# Patient Record
Sex: Female | Born: 1955
Health system: Southern US, Community
[De-identification: ages and names within clinical notes are randomized; demographics above are authoritative.]

## PROBLEM LIST (undated history)

## (undated) DIAGNOSIS — K430 Incisional hernia with obstruction, without gangrene: Secondary | ICD-10-CM

## (undated) DIAGNOSIS — K219 Gastro-esophageal reflux disease without esophagitis: Secondary | ICD-10-CM

## (undated) DIAGNOSIS — N179 Acute kidney failure, unspecified: Secondary | ICD-10-CM

## (undated) DIAGNOSIS — I10 Essential (primary) hypertension: Secondary | ICD-10-CM

## (undated) DIAGNOSIS — Z889 Allergy status to unspecified drugs, medicaments and biological substances status: Secondary | ICD-10-CM

## (undated) HISTORY — PX: TOTAL HIP ARTHROPLASTY: SHX124

## (undated) HISTORY — PX: ABDOMINAL HYSTERECTOMY: SHX81

## (undated) HISTORY — PX: WISDOM TOOTH EXTRACTION: SHX21

## (undated) HISTORY — PX: APPENDECTOMY: SHX54

## (undated) HISTORY — PX: TOE SURGERY: SHX1073

---

## 2005-08-23 ENCOUNTER — Ambulatory Visit (HOSPITAL_BASED_OUTPATIENT_CLINIC_OR_DEPARTMENT_OTHER): Admission: RE | Admit: 2005-08-23 | Discharge: 2005-08-23 | Payer: Self-pay | Admitting: Orthopedic Surgery

## 2005-08-23 ENCOUNTER — Ambulatory Visit (HOSPITAL_COMMUNITY): Admission: RE | Admit: 2005-08-23 | Discharge: 2005-08-23 | Payer: Self-pay | Admitting: Orthopedic Surgery

## 2008-02-29 ENCOUNTER — Other Ambulatory Visit: Admission: RE | Admit: 2008-02-29 | Discharge: 2008-02-29 | Payer: Self-pay | Admitting: Obstetrics & Gynecology

## 2008-03-10 DIAGNOSIS — K2100 Gastro-esophageal reflux disease with esophagitis, without bleeding: Secondary | ICD-10-CM | POA: Insufficient documentation

## 2008-03-10 DIAGNOSIS — E7800 Pure hypercholesterolemia, unspecified: Secondary | ICD-10-CM | POA: Insufficient documentation

## 2008-03-13 ENCOUNTER — Encounter: Admission: RE | Admit: 2008-03-13 | Discharge: 2008-03-13 | Payer: Self-pay | Admitting: Obstetrics & Gynecology

## 2008-03-25 ENCOUNTER — Encounter: Admission: RE | Admit: 2008-03-25 | Discharge: 2008-03-25 | Payer: Self-pay | Admitting: Obstetrics & Gynecology

## 2009-03-04 ENCOUNTER — Other Ambulatory Visit: Admission: RE | Admit: 2009-03-04 | Discharge: 2009-03-04 | Payer: Self-pay | Admitting: Obstetrics & Gynecology

## 2009-09-29 ENCOUNTER — Ambulatory Visit (HOSPITAL_COMMUNITY): Admission: RE | Admit: 2009-09-29 | Discharge: 2009-09-30 | Payer: Self-pay | Admitting: Obstetrics & Gynecology

## 2009-09-29 ENCOUNTER — Encounter: Payer: Self-pay | Admitting: Obstetrics & Gynecology

## 2010-02-09 DIAGNOSIS — F419 Anxiety disorder, unspecified: Secondary | ICD-10-CM | POA: Insufficient documentation

## 2010-06-04 ENCOUNTER — Encounter (INDEPENDENT_AMBULATORY_CARE_PROVIDER_SITE_OTHER): Payer: Self-pay | Admitting: General Surgery

## 2010-06-04 ENCOUNTER — Inpatient Hospital Stay (HOSPITAL_COMMUNITY): Admission: EM | Admit: 2010-06-04 | Discharge: 2010-06-08 | Payer: Self-pay | Admitting: Emergency Medicine

## 2010-06-30 DIAGNOSIS — G4700 Insomnia, unspecified: Secondary | ICD-10-CM | POA: Insufficient documentation

## 2010-09-16 ENCOUNTER — Inpatient Hospital Stay (HOSPITAL_COMMUNITY): Admission: RE | Admit: 2010-09-16 | Discharge: 2010-09-19 | Payer: Self-pay | Admitting: Orthopedic Surgery

## 2011-01-09 ENCOUNTER — Encounter: Payer: Self-pay | Admitting: Obstetrics & Gynecology

## 2011-03-03 LAB — COMPREHENSIVE METABOLIC PANEL
ALT: 19 U/L (ref 0–35)
AST: 23 U/L (ref 0–37)
BUN: 12 mg/dL (ref 6–23)
Creatinine, Ser: 0.56 mg/dL (ref 0.4–1.2)
Glucose, Bld: 97 mg/dL (ref 70–99)
Potassium: 4.4 mEq/L (ref 3.5–5.1)
Sodium: 137 mEq/L (ref 135–145)
Total Bilirubin: 0.5 mg/dL (ref 0.3–1.2)
Total Protein: 7.1 g/dL (ref 6.0–8.3)

## 2011-03-03 LAB — CBC
MCH: 33.5 pg (ref 26.0–34.0)
MCHC: 34.9 g/dL (ref 30.0–36.0)
MCV: 95.8 fL (ref 78.0–100.0)
RBC: 4.12 MIL/uL (ref 3.87–5.11)

## 2011-03-03 LAB — TYPE AND SCREEN
ABO/RH(D): A NEG
Antibody Screen: NEGATIVE

## 2011-03-03 LAB — DIFFERENTIAL
Basophils Absolute: 0 10*3/uL (ref 0.0–0.1)
Basophils Relative: 1 % (ref 0–1)
Eosinophils Absolute: 0.1 10*3/uL (ref 0.0–0.7)
Neutro Abs: 1.5 10*3/uL — ABNORMAL LOW (ref 1.7–7.7)

## 2011-03-03 LAB — URINALYSIS, ROUTINE W REFLEX MICROSCOPIC
Hgb urine dipstick: NEGATIVE
Nitrite: NEGATIVE
Specific Gravity, Urine: 1.009 (ref 1.005–1.030)
Urobilinogen, UA: 0.2 mg/dL (ref 0.0–1.0)

## 2011-03-03 LAB — HEMOGLOBIN AND HEMATOCRIT, BLOOD
HCT: 30.5 % — ABNORMAL LOW (ref 36.0–46.0)
HCT: 31.1 % — ABNORMAL LOW (ref 36.0–46.0)
HCT: 31.3 % — ABNORMAL LOW (ref 36.0–46.0)
Hemoglobin: 10.8 g/dL — ABNORMAL LOW (ref 12.0–15.0)
Hemoglobin: 12.5 g/dL (ref 12.0–15.0)

## 2011-03-03 LAB — URINE CULTURE: Colony Count: 25000

## 2011-03-03 LAB — APTT: aPTT: 26 seconds (ref 24–37)

## 2011-03-03 LAB — SURGICAL PCR SCREEN
MRSA, PCR: NEGATIVE
Staphylococcus aureus: NEGATIVE

## 2011-03-03 LAB — PROTIME-INR
Prothrombin Time: 13 seconds (ref 11.6–15.2)
Prothrombin Time: 13.6 seconds (ref 11.6–15.2)

## 2011-03-06 LAB — COMPREHENSIVE METABOLIC PANEL
AST: 29 U/L (ref 0–37)
Albumin: 3.5 g/dL (ref 3.5–5.2)
Alkaline Phosphatase: 72 U/L (ref 39–117)
Calcium: 8.6 mg/dL (ref 8.4–10.5)
Chloride: 99 mEq/L (ref 96–112)
GFR calc Af Amer: >60 mL/min (ref >60)
GFR calc non Af Amer: >60 mL/min (ref >60)
Potassium: 3.7 mEq/L (ref 3.5–5.1)
Total Protein: 7.3 g/dL (ref 6.0–8.3)

## 2011-03-06 LAB — URINALYSIS, ROUTINE W REFLEX MICROSCOPIC
Glucose, UA: NEGATIVE mg/dL
Specific Gravity, Urine: 1.014 (ref 1.005–1.030)
Urobilinogen, UA: 1 mg/dL (ref 0.0–1.0)

## 2011-03-06 LAB — BASIC METABOLIC PANEL
BUN: 3 mg/dL — ABNORMAL LOW (ref 6–23)
BUN: 3 mg/dL — ABNORMAL LOW (ref 6–23)
BUN: 3 mg/dL — ABNORMAL LOW (ref 6–23)
CO2: 26 mEq/L (ref 19–32)
Calcium: 8.5 mg/dL (ref 8.4–10.5)
Chloride: 104 mEq/L (ref 96–112)
Chloride: 105 mEq/L (ref 96–112)
Creatinine, Ser: 0.49 mg/dL (ref 0.4–1.2)
Creatinine, Ser: 0.56 mg/dL (ref 0.4–1.2)
GFR calc Af Amer: >60 mL/min (ref >60)
GFR calc non Af Amer: >60 mL/min (ref >60)
Glucose, Bld: 111 mg/dL — ABNORMAL HIGH (ref 70–99)
Glucose, Bld: 132 mg/dL — ABNORMAL HIGH (ref 70–99)
Potassium: 3.2 mEq/L — ABNORMAL LOW (ref 3.5–5.1)
Potassium: 3.3 mEq/L — ABNORMAL LOW (ref 3.5–5.1)
Sodium: 138 mEq/L (ref 135–145)

## 2011-03-06 LAB — DIFFERENTIAL
Eosinophils Absolute: 0 10*3/uL (ref 0.0–0.7)
Lymphocytes Relative: 8 % — ABNORMAL LOW (ref 12–46)
Lymphs Abs: 0.6 10*3/uL — ABNORMAL LOW (ref 0.7–4.0)
Monocytes Absolute: 0.5 10*3/uL (ref 0.1–1.0)
Neutro Abs: 6.2 10*3/uL (ref 1.7–7.7)
Neutrophils Relative %: 84 % — ABNORMAL HIGH (ref 43–77)

## 2011-03-06 LAB — CBC
HCT: 32.3 % — ABNORMAL LOW (ref 36.0–46.0)
HCT: 32.6 % — ABNORMAL LOW (ref 36.0–46.0)
Hemoglobin: 10.9 g/dL — ABNORMAL LOW (ref 12.0–15.0)
Hemoglobin: 14.2 g/dL (ref 12.0–15.0)
MCHC: 33.7 g/dL (ref 30.0–36.0)
MCV: 96.1 fL (ref 78.0–100.0)
MCV: 96.8 fL (ref 78.0–100.0)
Platelets: 281 10*3/uL (ref 150–400)
RBC: 3.35 MIL/uL — ABNORMAL LOW (ref 3.87–5.11)
RBC: 4.4 MIL/uL (ref 3.87–5.11)
RDW: 14.1 % (ref 11.5–15.5)
WBC: 8.3 10*3/uL (ref 4.0–10.5)
WBC: 8.8 10*3/uL (ref 4.0–10.5)

## 2011-03-06 LAB — LIPASE, BLOOD: Lipase: 22 U/L (ref 11–59)

## 2011-03-06 LAB — LACTIC ACID, PLASMA: Lactic Acid, Venous: 1.7 mmol/L (ref 0.5–2.2)

## 2011-03-24 LAB — BASIC METABOLIC PANEL
BUN: 7 mg/dL (ref 6–23)
CO2: 27 mEq/L (ref 19–32)
Chloride: 106 mEq/L (ref 96–112)
Chloride: 99 mEq/L (ref 96–112)
Creatinine, Ser: 0.61 mg/dL (ref 0.4–1.2)
GFR calc Af Amer: >60 mL/min (ref >60)
GFR calc non Af Amer: >60 mL/min (ref >60)
Glucose, Bld: 96 mg/dL (ref 70–99)
Potassium: 4.2 mEq/L (ref 3.5–5.1)
Potassium: 4.3 mEq/L (ref 3.5–5.1)

## 2011-03-24 LAB — CBC
HCT: 31.8 % — ABNORMAL LOW (ref 36.0–46.0)
HCT: 38.6 % (ref 36.0–46.0)
Hemoglobin: 13.4 g/dL (ref 12.0–15.0)
MCV: 98 fL (ref 78.0–100.0)
MCV: 98.2 fL (ref 78.0–100.0)
Platelets: 296 10*3/uL (ref 150–400)
Platelets: 322 10*3/uL (ref 150–400)
RBC: 3.24 MIL/uL — ABNORMAL LOW (ref 3.87–5.11)
RDW: 13.3 % (ref 11.5–15.5)
WBC: 6.9 10*3/uL (ref 4.0–10.5)

## 2011-03-24 LAB — URINALYSIS, ROUTINE W REFLEX MICROSCOPIC
Bilirubin Urine: NEGATIVE
Glucose, UA: NEGATIVE mg/dL
Ketones, ur: NEGATIVE mg/dL
Protein, ur: NEGATIVE mg/dL

## 2011-03-24 LAB — TYPE AND SCREEN

## 2011-03-24 LAB — ABO/RH: ABO/RH(D): A NEG

## 2011-05-06 NOTE — Op Note (Signed)
NAMECHERISH, RUNDE                ACCOUNT NO.:  [id number]   MEDICAL RECORD NO.:  [id number]         PATIENT TYPE:  AMB   LOCATION:  DSC                          FACILITY:  [facility]   PHYSICIAN:  Leonides Grills, M.D.     DATE OF BIRTH:  03/27/1956   DATE OF PROCEDURE:  08/23/2005  DATE OF DISCHARGE:                                 OPERATIVE REPORT   PREOPERATIVE DIAGNOSIS:  Right hallux rigidus.   POSTOPERATIVE DIAGNOSIS:  Right hallux rigidus.   OPERATION:  Right great toe cheilectomy.   ANESTHESIA:  General.   SURGEON:  Leonides Grills, M.D.   ASSISTANT:  Lianne Cure, P.A.   ESTIMATED BLOOD LOSS:  Minimal.   TOURNIQUET TIME:  Approximately 1/2 hour.   COMPLICATIONS:  None.   DISPOSITION:  Stable.   INDICATIONS:  This is a 55 year old pleasant female who has long-standing  dorsal right great toe pain ever since __________.  She was consented for  the above procedure.  All risks, which included infection, nerve/vessel  injury, stiffness, arthritis, possible fusion, prolonged recovery, same  pain, and worsening pain were all explained.  Questions were answered.   DESCRIPTION OF PROCEDURE:  The patient was brought to the operating room,  placed in supine position.  After adequate general endotracheal anesthesia  was administered, as well as Ancef 1 g IV piggyback, the right lower  extremity was then prepped and draped in sterile manner.  Proximal-placed  thigh tourniquet.  The limb was exsanguinated.  Tourniquet was elevated to  209 mmHg.  Longitudinal incision over the dorsal aspect of the great toe,  MCP joint, was the made.  Dissection was carried down through skin.  Hemostasis was obtained.  Extensor hallucis longus was then identified and  protected throughout the case within its sheath.  Longitudinal capsulotomy  was then made.  A cheilectomy was then performed with a sagittal saw and a  rongeur.  There was extra loose body here as well, and this was removed.  Once  this was adequately decompressed, both in the medial and lateral  gutters, as well as the base of the proximal phalanx, and approximately the  upper third of the joint was removed.  The area was copiously irrigated with  normal saline.  The toe was ranged at about 70-80 degrees of range of  motion, dorsiflexion.  The area was again copiously irrigated with normal  saline.  Bone wax was applied to open bone  surfaces.  Capsule was closed with 3-0 Vicryl suture.  Tourniquet was  deflated.  Hemostasis was obtained.  Skin was closed with 4-0 nylon suture.  Sterile dressing was applied.  Hard-soled shoe was applied.  The patient was  stable __________.      Leonides Grills, M.D.  Electronically Signed     PB/MEDQ  D:  08/23/2005  T:  08/23/2005  Job:  [id number]

## 2012-03-05 ENCOUNTER — Other Ambulatory Visit: Payer: Self-pay | Admitting: Obstetrics & Gynecology

## 2012-03-05 DIAGNOSIS — Z1231 Encounter for screening mammogram for malignant neoplasm of breast: Secondary | ICD-10-CM

## 2012-03-15 ENCOUNTER — Ambulatory Visit
Admission: RE | Admit: 2012-03-15 | Discharge: 2012-03-15 | Disposition: A | Discharge status: Home / Self Care | Payer: BC Managed Care – PPO | Source: Ambulatory Visit | Attending: Obstetrics & Gynecology | Admitting: Obstetrics & Gynecology

## 2012-03-15 DIAGNOSIS — Z1231 Encounter for screening mammogram for malignant neoplasm of breast: Secondary | ICD-10-CM

## 2012-05-31 ENCOUNTER — Other Ambulatory Visit: Payer: Self-pay | Admitting: Family Medicine

## 2012-05-31 DIAGNOSIS — Z780 Asymptomatic menopausal state: Secondary | ICD-10-CM

## 2012-06-14 ENCOUNTER — Ambulatory Visit
Admission: RE | Admit: 2012-06-14 | Discharge: 2012-06-14 | Disposition: A | Discharge status: Home / Self Care | Payer: BC Managed Care – PPO | Source: Ambulatory Visit | Attending: Family Medicine | Admitting: Family Medicine

## 2012-06-14 DIAGNOSIS — Z780 Asymptomatic menopausal state: Secondary | ICD-10-CM

## 2013-08-20 LAB — CBC AND DIFFERENTIAL
HCT: 41 % (ref 36–46)
Hemoglobin: 14.2 g/dL (ref 12.0–16.0)
Platelets: 217 10*3/uL (ref 150–399)
WBC: 4.6 10*3/mL

## 2013-08-20 LAB — LIPID PANEL
Cholesterol: 205 mg/dL — AB (ref 0–200)
HDL: 67 mg/dL (ref 35–70)
LDL Cholesterol: 112 mg/dL
Triglycerides: 129 mg/dL (ref 40–160)

## 2013-08-20 LAB — BASIC METABOLIC PANEL
BUN: 5 mg/dL (ref 4–21)
CREATININE: 0.6 mg/dL (ref 0.5–1.1)
Glucose: 105 mg/dL
POTASSIUM: 3.7 mmol/L (ref 3.4–5.3)
SODIUM: 140 mmol/L (ref 137–147)

## 2013-08-20 LAB — TSH: TSH: 4.22 u[IU]/mL (ref 0.41–5.90)

## 2015-07-06 DIAGNOSIS — E663 Overweight: Secondary | ICD-10-CM | POA: Insufficient documentation

## 2015-07-06 DIAGNOSIS — K589 Irritable bowel syndrome without diarrhea: Secondary | ICD-10-CM | POA: Insufficient documentation

## 2015-07-06 DIAGNOSIS — I10 Essential (primary) hypertension: Secondary | ICD-10-CM | POA: Insufficient documentation

## 2015-07-06 DIAGNOSIS — F320 Major depressive disorder, single episode, mild: Secondary | ICD-10-CM | POA: Insufficient documentation

## 2015-07-06 DIAGNOSIS — Z780 Asymptomatic menopausal state: Secondary | ICD-10-CM | POA: Insufficient documentation

## 2015-07-06 DIAGNOSIS — M9979 Connective tissue and disc stenosis of intervertebral foramina of abdomen and other regions: Secondary | ICD-10-CM | POA: Insufficient documentation

## 2015-07-06 DIAGNOSIS — Z87891 Personal history of nicotine dependence: Secondary | ICD-10-CM | POA: Insufficient documentation

## 2015-07-06 DIAGNOSIS — M479 Spondylosis, unspecified: Secondary | ICD-10-CM | POA: Insufficient documentation

## 2015-07-07 ENCOUNTER — Ambulatory Visit (INDEPENDENT_AMBULATORY_CARE_PROVIDER_SITE_OTHER): Payer: BLUE CROSS/BLUE SHIELD | Admitting: Family Medicine

## 2015-07-07 ENCOUNTER — Encounter: Payer: Self-pay | Admitting: Family Medicine

## 2015-07-07 VITALS — BP 124/80 | HR 72 | Temp 97.4°F | Resp 16 | Wt 158.0 lb

## 2015-07-07 DIAGNOSIS — R05 Cough: Secondary | ICD-10-CM

## 2015-07-07 DIAGNOSIS — I10 Essential (primary) hypertension: Secondary | ICD-10-CM

## 2015-07-07 DIAGNOSIS — J40 Bronchitis, not specified as acute or chronic: Secondary | ICD-10-CM | POA: Diagnosis not present

## 2015-07-07 DIAGNOSIS — R059 Cough, unspecified: Secondary | ICD-10-CM

## 2015-07-07 MED ORDER — BENZONATATE 200 MG PO CAPS: 200.0000 mg | ORAL_CAPSULE | Freq: Three times a day (TID) | ORAL | Status: DC | PRN

## 2015-07-07 MED ORDER — METOPROLOL SUCCINATE ER 50 MG PO TB24: 50.0000 mg | ORAL_TABLET | Freq: Every day | ORAL | Status: DC

## 2015-07-07 MED ORDER — AZITHROMYCIN 250 MG PO TABS: ORAL_TABLET | ORAL | Status: DC

## 2015-07-07 NOTE — Progress Notes (Signed)
Patient ID: Ana Ryan, female   DOB: [birth year], 59 y.o.   MRN: [id number]    Subjective:  URI  This is a new problem. Episode onset: 2 weeks ago. The problem has been unchanged. There has been no fever. Associated symptoms include congestion, coughing (dry), ear pain (right ear fullness), a plugged ear sensation, rhinorrhea and a sore throat (and hoarseness in the begining). Pertinent negatives include no abdominal pain, chest pain, diarrhea, dysuria, headaches, joint pain, joint swelling, nausea, neck pain, rash, sinus pain, sneezing, swollen glands, vomiting or wheezing. She has tried decongestant for the symptoms. The treatment provided mild relief.  Hypertension Associated symptoms include malaise/fatigue (does not feel 100% but does not feel terrible.). Pertinent negatives include no chest pain, headaches or neck pain.  Pt reports that she needs a refill on her Metoprolol today. She is not checking her BP at home.    Prior to Admission medications   Medication Sig Start Date End Date Taking? Authorizing Provider  calcium-vitamin D (OSCAL 500/200 D-3) 500-200 MG-UNIT per tablet Take by mouth.   Yes Historical Provider, MD  esomeprazole (NEXIUM) 40 MG packet NEXIUM, 40MG (Oral Packet)  1 Every Day for 0 days  Quantity: 0.00;  Refills: 0   Ordered :25-Nov-2010  Althea Charon ;  Started 09-Feb-2010 Active 02/09/10  Yes Historical Provider, MD  metoprolol succinate (TOPROL-XL) 50 MG 24 hr tablet Take by mouth. 09/15/14  Yes Historical Provider, MD  Melatonin 1 MG CAPS Take by mouth.    Historical Provider, MD    Patient Active Problem List   Diagnosis Date Noted  . Narrowing of intervertebral disc space 07/06/2015  . Spondylosis 07/06/2015  . Ex-cigarette smoker 07/06/2015  . BP (high blood pressure) 07/06/2015  . Adaptive colitis 07/06/2015  . Mild major depression 07/06/2015  . Asymptomatic postmenopausal status 07/06/2015  . Overweight 07/06/2015  . Cannot sleep 06/30/2010    . Anxiety 02/09/2010  . Esophagitis, reflux 03/10/2008  . Hypercholesteremia 03/10/2008    History reviewed. No pertinent past medical history.  History   Social History  . Marital Status: Married    Spouse Name: N/A  . Number of Children: N/A  . Years of Education: N/A   Occupational History  . Not on file.   Social History Main Topics  . Smoking status: Former Research scientist (life sciences)  . Smokeless tobacco: Not on file     Comment: did not smoke regularly  . Alcohol Use: No  . Drug Use: No  . Sexual Activity: Not on file   Other Topics Concern  . Not on file   Social History Narrative    No Known Allergies  Review of Systems  Constitutional: Positive for malaise/fatigue (does not feel 100% but does not feel terrible.).  HENT: Positive for congestion, ear pain (right ear fullness), rhinorrhea and sore throat (and hoarseness in the begining). Negative for sneezing.   Eyes: Negative.   Respiratory: Positive for cough (dry). Negative for wheezing.   Cardiovascular: Negative.  Negative for chest pain.  Gastrointestinal: Negative.  Negative for nausea, vomiting, abdominal pain and diarrhea.  Genitourinary: Negative.  Negative for dysuria.  Musculoskeletal: Negative.  Negative for joint pain and neck pain.  Skin: Negative.  Negative for rash.  Neurological: Negative.  Negative for headaches.  Endo/Heme/Allergies: Negative.   Psychiatric/Behavioral: Negative.     Immunization History  Administered Date(s) Administered  . Tdap 06/30/2010   Objective:  BP 124/80 mmHg  Pulse 72  Temp(Src) 97.4 F (36.3 C) (Oral)  Resp  16  Wt 158 lb (71.668 kg)  SpO2 98%  Physical Exam  Constitutional: She is oriented to person, place, and time and well-developed, well-nourished, and in no distress.  HENT:  Head: Normocephalic and atraumatic.  Right Ear: External ear normal.  Left Ear: External ear normal.  Nose: Nose normal.  Mouth/Throat: Oropharynx is clear and moist.  Eyes: Conjunctivae  and EOM are normal. Pupils are equal, round, and reactive to light.  Neck: Normal range of motion. Neck supple.  Cardiovascular: Normal rate, regular rhythm, normal heart sounds and intact distal pulses.   Pulmonary/Chest: Effort normal and breath sounds normal.  Abdominal: Soft. Bowel sounds are normal.  Neurological: She is alert and oriented to person, place, and time. She has normal reflexes. Gait normal. GCS score is 15.  Skin: Skin is warm and dry.  Psychiatric: Mood, memory, affect and judgment normal.    Lab Results  Component Value Date   WBC 4.6 08/20/2013   HGB 14.2 08/20/2013   HCT 41 08/20/2013   PLT 217 08/20/2013   GLUCOSE 97 09/08/2010   CHOL 205* 08/20/2013   TRIG 129 08/20/2013   HDL 67 08/20/2013   LDLCALC 112 08/20/2013   TSH 4.22 08/20/2013   INR 1.66* 09/19/2010    CMP     Component Value Date/Time   NA 140 08/20/2013   NA 137 09/08/2010 1404   K 3.7 08/20/2013   CL 102 09/08/2010 1404   CO2 26 09/08/2010 1404   GLUCOSE 97 09/08/2010 1404   BUN 5 08/20/2013   BUN 12 09/08/2010 1404   CREATININE 0.6 08/20/2013   CREATININE 0.56 09/08/2010 1404   CALCIUM 9.0 09/08/2010 1404   PROT 7.1 09/08/2010 1404   ALBUMIN 4.3 09/08/2010 1404   AST 23 09/08/2010 1404   ALT 19 09/08/2010 1404   ALKPHOS 56 09/08/2010 1404   BILITOT 0.5 09/08/2010 1404   GFRNONAA >60 09/08/2010 1404   GFRAA  09/08/2010 1404    >60        The eGFR has been calculated using the MDRD equation. This calculation has not been validated in all clinical situations. eGFR's persistently <60 mL/min signify possible Chronic Kidney Disease.    Assessment and Plan :  1. Essential hypertension  - metoprolol succinate (TOPROL-XL) 50 MG 24 hr tablet; Take 1 tablet (50 mg total) by mouth daily.  Dispense: 30 tablet; Refill: 12  2. Cough  - benzonatate (TESSALON) 200 MG capsule; Take 1 capsule (200 mg total) by mouth 3 (three) times daily as needed for cough.  Dispense: 45 capsule;  Refill: 2  3. Bronchitis  - azithromycin (ZITHROMAX) 250 MG tablet; Take 2 pills the first day and then one daily until finished.  Dispense: 6 each; Refill: 0 Chest x-ray not indicated  4. Depression/anxiety/substance use Patient has been doing very well in 2016. Much better Patient was seen and examined by Dr. Miguel Aschoff, and noted scribed by Webb Laws, [place] MD [facility] 07/07/2015 11:39 AM

## 2015-08-19 ENCOUNTER — Encounter: Payer: Self-pay | Admitting: Family Medicine

## 2016-02-16 ENCOUNTER — Ambulatory Visit: Payer: BLUE CROSS/BLUE SHIELD | Admitting: Family Medicine

## 2016-02-18 ENCOUNTER — Encounter: Payer: Self-pay | Admitting: *Deleted

## 2016-02-18 ENCOUNTER — Encounter: Payer: Self-pay | Admitting: Family Medicine

## 2016-02-18 ENCOUNTER — Ambulatory Visit (INDEPENDENT_AMBULATORY_CARE_PROVIDER_SITE_OTHER): Payer: BLUE CROSS/BLUE SHIELD | Admitting: Family Medicine

## 2016-02-18 VITALS — BP 108/64 | HR 68 | Temp 97.6°F | Resp 16 | Wt 171.0 lb

## 2016-02-18 DIAGNOSIS — K4090 Unilateral inguinal hernia, without obstruction or gangrene, not specified as recurrent: Secondary | ICD-10-CM

## 2016-02-18 NOTE — Progress Notes (Signed)
Patient ID: Ana Ryan, female   DOB: 30-Nov-1956, 60 y.o.   MRN: [id number]    Subjective:  HPI Pt is here today because about 3-4 years ago she noticed that she has "lumps" on her abdomen. She reports that they are near her surgical scars from prior surgeries. They do not hurt and she is not having any other symptoms. She has noticed that they have gotten " bigger" or she is noticing them more. She reports that this may be because she has gained weight. She wants to make sure these are nothing worrisome and that they are just scar tissue. Overall patient is doing well. Her middle son got married and ended 2016. Her oldest son is married. Daughter has a 58-year-old daughter who is the patient's only grandchild. Prior to Admission medications   Medication Sig Start Date End Date Taking? Authorizing Provider  calcium-vitamin D (OSCAL 500/200 D-3) 500-200 MG-UNIT per tablet Take by mouth.   Yes Historical Provider, MD  esomeprazole (NEXIUM) 40 MG packet NEXIUM, 40MG (Oral Packet)  1 Every Day for 0 days  Quantity: 0.00;  Refills: 0   Ordered :25-Nov-2010  Althea Charon ;  Started 09-Feb-2010 Active 02/09/10  Yes Historical Provider, MD  Melatonin 1 MG CAPS Take by mouth.   Yes Historical Provider, MD  metoprolol succinate (TOPROL-XL) 50 MG 24 hr tablet Take 1 tablet (50 mg total) by mouth daily. 07/07/15  Yes Esaias Cleavenger Maceo Pro., MD    Patient Active Problem List   Diagnosis Date Noted  . Narrowing of intervertebral disc space 07/06/2015  . Spondylosis 07/06/2015  . Ex-cigarette smoker 07/06/2015  . BP (high blood pressure) 07/06/2015  . Adaptive colitis 07/06/2015  . Mild major depression ([place]) 07/06/2015  . Asymptomatic postmenopausal status 07/06/2015  . Overweight 07/06/2015  . Cannot sleep 06/30/2010  . Anxiety 02/09/2010  . Esophagitis, reflux 03/10/2008  . Hypercholesteremia 03/10/2008    History reviewed. No pertinent past medical history.  Social History   Social  History  . Marital Status: Married    Spouse Name: N/A  . Number of Children: N/A  . Years of Education: N/A   Occupational History  . Not on file.   Social History Main Topics  . Smoking status: Former Research scientist (life sciences)  . Smokeless tobacco: Not on file     Comment: did not smoke regularly  . Alcohol Use: No  . Drug Use: No  . Sexual Activity: Not on file   Other Topics Concern  . Not on file   Social History Narrative    No Known Allergies  Review of Systems  Constitutional: Negative.   HENT: Negative.   Respiratory: Negative.   Cardiovascular: Negative.   Gastrointestinal: Negative.        Abdominal "lumps"  Genitourinary: Negative.   Musculoskeletal: Negative.   Skin: Negative.   Endo/Heme/Allergies: Negative.   Psychiatric/Behavioral: Negative.     Immunization History  Administered Date(s) Administered  . Influenza-Unspecified 09/19/2015  . Tdap 06/30/2010   Objective:  BP 108/64 mmHg  Pulse 68  Temp(Src) 97.6 F (36.4 C) (Oral)  Resp 16  Wt 171 lb (77.565 kg)  Physical Exam  Constitutional: She is well-developed, well-nourished, and in no distress.  HENT:  Head: Normocephalic and atraumatic.  Right Ear: External ear normal.  Left Ear: External ear normal.  Nose: Nose normal.  Eyes: Conjunctivae are normal.  Cardiovascular: Normal rate, regular rhythm, normal heart sounds and intact distal pulses.   Pulmonary/Chest: Effort normal and breath sounds normal.  Abdominal: Soft. She exhibits no distension and no mass.  Small reducible hernia in the right mid abdomen. There might be a small hernia just above the umbilicus.    Lab Results  Component Value Date   WBC 4.6 08/20/2013   HGB 14.2 08/20/2013   HCT 41 08/20/2013   PLT 217 08/20/2013   GLUCOSE 97 09/08/2010   CHOL 205* 08/20/2013   TRIG 129 08/20/2013   HDL 67 08/20/2013   LDLCALC 112 08/20/2013   TSH 4.22 08/20/2013   INR 1.66* 09/19/2010    CMP     Component Value Date/Time   NA 140  08/20/2013   NA 137 09/08/2010 1404   K 3.7 08/20/2013   CL 102 09/08/2010 1404   CO2 26 09/08/2010 1404   GLUCOSE 97 09/08/2010 1404   BUN 5 08/20/2013   BUN 12 09/08/2010 1404   CREATININE 0.6 08/20/2013   CREATININE 0.56 09/08/2010 1404   CALCIUM 9.0 09/08/2010 1404   PROT 7.1 09/08/2010 1404   ALBUMIN 4.3 09/08/2010 1404   AST 23 09/08/2010 1404   ALT 19 09/08/2010 1404   ALKPHOS 56 09/08/2010 1404   BILITOT 0.5 09/08/2010 1404   GFRNONAA >60 09/08/2010 1404   GFRAA  09/08/2010 1404    >60        The eGFR has been calculated using the MDRD equation. This calculation has not been validated in all clinical situations. eGFR's persistently <60 mL/min signify possible Chronic Kidney Disease.    Assessment and Plan :  1. Incisional. hernia without obstruction or gangrene, recurrence not specified, unspecified laterality - Ambulatory referral to General Surgery 2. Mild obesity Dietary changes discussed to help deal with the hernias listed above. Weight loss will help to a small degree. I have done the exam and reviewed the above chart and it is accurate to the best of my knowledge.   Miguel Aschoff MD [facility] 02/18/2016 3:09 PM

## 2016-03-01 ENCOUNTER — Ambulatory Visit: Payer: Self-pay | Admitting: General Surgery

## 2016-03-11 ENCOUNTER — Other Ambulatory Visit: Payer: Self-pay | Admitting: General Surgery

## 2016-03-11 DIAGNOSIS — K432 Incisional hernia without obstruction or gangrene: Secondary | ICD-10-CM

## 2016-03-16 ENCOUNTER — Ambulatory Visit
Admission: RE | Admit: 2016-03-16 | Discharge: 2016-03-16 | Disposition: A | Discharge status: Home / Self Care | Payer: BLUE CROSS/BLUE SHIELD | Source: Ambulatory Visit | Attending: General Surgery | Admitting: General Surgery

## 2016-03-16 DIAGNOSIS — K432 Incisional hernia without obstruction or gangrene: Secondary | ICD-10-CM

## 2016-03-16 MED ORDER — IOPAMIDOL (ISOVUE-300) INJECTION 61%
100.0000 mL | Freq: Once | INTRAVENOUS | Status: AC | PRN
  Administered 2016-03-16: 100 mL via INTRAVENOUS

## 2016-03-24 ENCOUNTER — Other Ambulatory Visit: Payer: Self-pay | Admitting: General Surgery

## 2016-03-24 DIAGNOSIS — K432 Incisional hernia without obstruction or gangrene: Secondary | ICD-10-CM | POA: Diagnosis not present

## 2016-03-24 DIAGNOSIS — Z90710 Acquired absence of both cervix and uterus: Secondary | ICD-10-CM | POA: Diagnosis not present

## 2016-03-24 DIAGNOSIS — Z9049 Acquired absence of other specified parts of digestive tract: Secondary | ICD-10-CM | POA: Diagnosis not present

## 2016-03-24 DIAGNOSIS — Z87891 Personal history of nicotine dependence: Secondary | ICD-10-CM | POA: Diagnosis not present

## 2016-04-10 NOTE — H&P (Signed)
Bryon LionsMaria E. Sheliah HatchWarner  [facility] Patient #: [id number] DOB: [birth year] Married / Language: English / Race: White Female        History of Present Illness   This is a 60 year old female who returns for evaluation of her ventral incisional hernias following CT scan.  This is a patient of Dr. Julieanne Mansonichard Gilbert in [facility]. She's had 3 cesarean sections through a Pfannenstiel incision. She's had laparoscopic appendectomy in the past for a perforated appendix requiring six-day hospitalization. She had a laparoscopic-assisted vaginal hysterectomy without BSO. She is noticed painless lumps in her upper abdomen in the midline above the umbilicus. When I saw her on March 09, 2016 and felt that she had 2 incisional hernias in the midline above the umbilicus.  CT scan confirms 2 fat-containing hernias, relatively small hernia neck and sacs no more than 2.5 cm just above the umbilicus. There is a second one with a hernia neck 1 cm in diameter and a sac measuring 5.5 cm. Both of these contained fat only. She has no gastrointestinal symptoms.  She states that she is motivated to have these repaired electively. I think because the hernias were not that large she is a good candidate for laparoscopic repair. We'll try left subcostal access with optical port. She will be scheduled for laparoscopic repair of her incarcerated ventral incisional hernias with mesh, possible open laparotomy and repair. I discussed the indications, details, techniques, and numerous risk of the surgery with her and her husband. She is aware the risks of bleeding, infection, conversion to open laparotomy, injury to adjacent organs with major reconstructive surgery, recurrence of the hernia, nerve damage and chronic pain and other unforeseen pops. She understands all of these issues. All of her questions were answered. She noted she may have severe adhesions that will make this more difficult or  impossible. She agrees with this plan.  Comorbidities include BMI 27, GERD, degenerative disc disease, borderline hypertension, former smoker. Previous multiple abdominal operations as mentioned above. Right total hip replacement with good result.   Allergies  No Known Drug Allergies03/22/2017  Medication History  Metoprolol Tartrate (50MG  Tablet, Oral) Active. NexIUM (40MG  Capsule DR, Oral) Active. Oscal 500/200 D-3 (500-200MG -UNIT Tablet, Oral) Active. Medications Reconciled  Vitals  Weight: 170 lb Height: 62.5in Body Surface Area: 1.79 m Body Mass Index: 30.6 kg/m  Temp.: 97.4F  Pulse: 78 (Regular)  BP: 139/70 (Sitting, Left Arm, Standard)       Physical Exam  General Mental Status-Alert. General Appearance-Not in acute distress. Build & Nutrition-Well nourished. Posture-Normal posture. Gait-Normal.  Head and Neck Head-normocephalic, atraumatic with no lesions or palpable masses. Trachea-midline. Thyroid Gland Characteristics - normal size and consistency and no palpable nodules.  Chest and Lung Exam Chest and lung exam reveals -on auscultation, normal breath sounds, no adventitious sounds and normal vocal resonance.  Cardiovascular Cardiovascular examination reveals -normal heart sounds, regular rate and rhythm with no murmurs and femoral artery auscultation bilaterally reveals normal pulses, no bruits, no thrills.  Abdomen Note: Borderline obese. Soft and nontender. No organomegaly. Well-healed Pfannenstiel incision but no pain or lump or hernia there. Transverse incision below the umbilicus. Almost 3 cm transverse incision in the midline several centimeters above the umbilicus. Trocar site right upper quadrant. Trocar site left lower quadrant. She has 2 apparent partially reducible hernias in the midline above the umbilicus. One just a couple of cm above the umbilicus the larger one above the transverse midepigastric  incision. These can be protruded and can be  partially reduced. No skin changes. Not really tender today.   Neurologic Neurologic evaluation reveals -alert and oriented x 3 with no impairment of recent or remote memory, normal attention span and ability to concentrate, normal sensation and normal coordination.  Musculoskeletal Normal Exam - Bilateral-Upper Extremity Strength Normal and Lower Extremity Strength Normal.    Assessment & Plan   INCISIONAL HERNIA, WITHOUT OBSTRUCTION OR GANGRENE (K43.2)  Your CT scan confirms 2 small incisional hernias in the midline above the umbilicus. These contain fat, but no intestine. We have discussed the anatomic features of these hernias and the natural history, which is to usually progress You have requested that we proceed with repair of the hernias surgically, and that is reasonable  you will be scheduled for laparoscopic repair of your ventral incisional hernias with mesh, possible open repair in the near future You have requested [facility] long hospital We have discussed the indications, techniques, and numerous risk of this surgery You have a patient information booklet that we discussed   FORMER SMOKER (Z87.891) HISTORY OF LAPAROSCOPIC-ASSISTED VAGINAL HYSTERECTOMY (Z90.710) HISTORY OF LAPAROSCOPIC APPENDECTOMY (Z90.49) HISTORY OF 3 CESAREAN SECTIONS (Z87.59) CHRONIC GERD (K21.9) BMI 28.0-28.9,ADULT (Z68.28)   Sosha Shepherd M. Derrell Lolling, M.D., [facility] General and Minimally invasive Surgery Breast and Colorectal Surgery Office:   [phone] Pager:   [phone]

## 2016-04-11 ENCOUNTER — Encounter (HOSPITAL_COMMUNITY): Payer: Self-pay

## 2016-04-11 ENCOUNTER — Encounter (HOSPITAL_COMMUNITY)
Admission: RE | Admit: 2016-04-11 | Discharge: 2016-04-11 | Disposition: A | Discharge status: Home / Self Care | Payer: BLUE CROSS/BLUE SHIELD | Source: Ambulatory Visit | Attending: General Surgery | Admitting: General Surgery

## 2016-04-11 DIAGNOSIS — Z96641 Presence of right artificial hip joint: Secondary | ICD-10-CM | POA: Diagnosis not present

## 2016-04-11 DIAGNOSIS — K660 Peritoneal adhesions (postprocedural) (postinfection): Secondary | ICD-10-CM | POA: Diagnosis not present

## 2016-04-11 DIAGNOSIS — K219 Gastro-esophageal reflux disease without esophagitis: Secondary | ICD-10-CM | POA: Diagnosis not present

## 2016-04-11 DIAGNOSIS — Z90710 Acquired absence of both cervix and uterus: Secondary | ICD-10-CM | POA: Diagnosis not present

## 2016-04-11 DIAGNOSIS — I10 Essential (primary) hypertension: Secondary | ICD-10-CM | POA: Diagnosis not present

## 2016-04-11 DIAGNOSIS — Z9049 Acquired absence of other specified parts of digestive tract: Secondary | ICD-10-CM | POA: Diagnosis not present

## 2016-04-11 DIAGNOSIS — K430 Incisional hernia with obstruction, without gangrene: Secondary | ICD-10-CM | POA: Diagnosis not present

## 2016-04-11 DIAGNOSIS — E669 Obesity, unspecified: Secondary | ICD-10-CM | POA: Diagnosis not present

## 2016-04-11 DIAGNOSIS — Z87891 Personal history of nicotine dependence: Secondary | ICD-10-CM | POA: Diagnosis not present

## 2016-04-11 DIAGNOSIS — Z6831 Body mass index (BMI) 31.0-31.9, adult: Secondary | ICD-10-CM | POA: Diagnosis not present

## 2016-04-11 DIAGNOSIS — Z79899 Other long term (current) drug therapy: Secondary | ICD-10-CM | POA: Diagnosis not present

## 2016-04-11 HISTORY — DX: Essential (primary) hypertension: I10

## 2016-04-11 HISTORY — DX: Gastro-esophageal reflux disease without esophagitis: K21.9

## 2016-04-11 HISTORY — DX: Allergy status to unspecified drugs, medicaments and biological substances: Z88.9

## 2016-04-11 LAB — CBC WITH DIFFERENTIAL/PLATELET
BASOS ABS: 0 10*3/uL (ref 0.0–0.1)
BASOS PCT: 1 %
EOS ABS: 0.1 10*3/uL (ref 0.0–0.7)
Eosinophils Relative: 4 %
HEMATOCRIT: 39.6 % (ref 36.0–46.0)
HEMOGLOBIN: 13.5 g/dL (ref 12.0–15.0)
Lymphocytes Relative: 38 %
Lymphs Abs: 1.3 10*3/uL (ref 0.7–4.0)
MCH: 32.8 pg (ref 26.0–34.0)
MCHC: 34.1 g/dL (ref 30.0–36.0)
MCV: 96.4 fL (ref 78.0–100.0)
MONOS PCT: 8 %
Monocytes Absolute: 0.3 10*3/uL (ref 0.1–1.0)
NEUTROS ABS: 1.7 10*3/uL (ref 1.7–7.7)
NEUTROS PCT: 49 %
Platelets: 262 10*3/uL (ref 150–400)
RBC: 4.11 MIL/uL (ref 3.87–5.11)
RDW: 13 % (ref 11.5–15.5)
WBC: 3.3 10*3/uL — AB (ref 4.0–10.5)

## 2016-04-11 LAB — COMPREHENSIVE METABOLIC PANEL
ALBUMIN: 3.9 g/dL (ref 3.5–5.0)
ALK PHOS: 56 U/L (ref 38–126)
ALT: 29 U/L (ref 14–54)
ANION GAP: 11 (ref 5–15)
AST: 26 U/L (ref 15–41)
BUN: 15 mg/dL (ref 6–20)
CALCIUM: 9 mg/dL (ref 8.9–10.3)
CO2: 21 mmol/L — AB (ref 22–32)
Chloride: 106 mmol/L (ref 101–111)
Creatinine, Ser: 0.59 mg/dL (ref 0.44–1.00)
GFR calc Af Amer: >60 mL/min (ref >60)
GFR calc non Af Amer: >60 mL/min (ref >60)
GLUCOSE: 99 mg/dL (ref 65–99)
Potassium: 4.6 mmol/L (ref 3.5–5.1)
SODIUM: 138 mmol/L (ref 135–145)
Total Bilirubin: 0.5 mg/dL (ref 0.3–1.2)
Total Protein: 6.7 g/dL (ref 6.5–8.1)

## 2016-04-11 NOTE — Patient Instructions (Signed)
Ana Ryan  04/11/2016   Your procedure is scheduled on: 04-13-16   [facility]  [facility] to 3rd floor to  Short Stay Center at  0600  AM.  Call this number if you have problems the morning of surgery [phone]   Remember: ONLY 1 PERSON MAY GO WITH YOU TO SHORT STAY TO GET  READY MORNING OF YOUR SURGERY.  Do not eat food or drink liquids :After Midnight.     Take these medicines the morning of surgery with A SIP OF WATER: Nexium. Metoprolol.  DO NOT TAKE ANY DIABETIC MEDICATIONS DAY OF YOUR SURGERY                               You may not have any metal on your body including hair pins and              piercings  Do not wear jewelry, make-up, lotions, powders or perfumes, deodorant             Do not wear nail polish.  Do not shave  48 hours prior to surgery.              Men may shave face and neck.   Do not bring valuables to the hospital. [place] IS NOT             RESPONSIBLE   FOR VALUABLES.  Contacts, dentures or bridgework may not be worn into surgery.  Leave suitcase in the car. After surgery it may be brought to your room.     Patients discharged the day of surgery will not be allowed to drive home.  Name and phone number of your driver:Joe Sheliah HatchWarner ,spouse [phone] cell  Special Instructions: N/A              Please read over the following fact sheets you were given: _____________________________________________________________________             [facility] - Preparing for [facility] Before surgery, you can play an important role.  Because skin is not sterile, your skin needs to be as free of germs as possible.  You can reduce the number of germs on your skin by washing with CHG (chlorahexidine gluconate) soap before surgery.  CHG is an antiseptic cleaner which kills germs and bonds with the skin to continue killing germs even after washing. Please DO NOT use if you have an allergy to CHG or  antibacterial soaps.  If your skin becomes reddened/irritated stop using the CHG and inform your nurse when you arrive at Short Stay. Do not shave (including legs and underarms) for at least 48 hours prior to the first CHG shower.  You may shave your face/neck. Please follow these instructions carefully:  1.  Shower with CHG Soap the night before surgery and the  morning of Surgery.  2.  If you choose to wash your hair, wash your hair first as usual with your  normal  shampoo.  3.  After you shampoo, rinse your hair and body thoroughly to remove the  shampoo.                           4.  Use CHG as you would any other liquid soap.  You can apply  chg directly  to the skin and wash                       Gently with a scrungie or clean washcloth.  5.  Apply the CHG Soap to your body ONLY FROM THE NECK DOWN.   Do not use on face/ open                           Wound or open sores. Avoid contact with eyes, ears mouth and genitals (private parts).                       Wash face,  Genitals (private parts) with your normal soap.             6.  Wash thoroughly, paying special attention to the area where your surgery  will be performed.  7.  Thoroughly rinse your body with warm water from the neck down.  8.  DO NOT shower/wash with your normal soap after using and rinsing off  the CHG Soap.                9.  Pat yourself dry with a clean towel.            10.  Wear clean pajamas.            11.  Place clean sheets on your bed the night of your first shower and do not  sleep with pets. Day of Surgery : Do not apply any lotions/deodorants the morning of surgery.  Please wear clean clothes to the hospital/surgery center.  FAILURE TO FOLLOW THESE INSTRUCTIONS MAY RESULT IN THE CANCELLATION OF YOUR SURGERY PATIENT SIGNATURE_________________________________  NURSE SIGNATURE__________________________________  ________________________________________________________________________

## 2016-04-11 NOTE — Pre-Procedure Instructions (Signed)
EKG of today noted by Dr. Mable FillB. Judd.

## 2016-04-11 NOTE — Pre-Procedure Instructions (Addendum)
CT abd/pelvis 03-16-16 Epic. EKG done today.

## 2016-04-12 NOTE — Anesthesia Preprocedure Evaluation (Addendum)
Anesthesia Evaluation  Patient identified by MRN, date of birth, ID band Patient awake    Reviewed: Allergy & Precautions, NPO status , Patient's Chart, lab work & pertinent test results, reviewed documented beta blocker date and time   Airway Mallampati: I  TM Distance: >3 FB Neck ROM: Full    Dental  (+) Teeth Intact, Dental Advisory Given   Pulmonary former smoker,    Pulmonary exam normal breath sounds clear to auscultation       Cardiovascular hypertension, Pt. on medications and Pt. on home beta blockers Normal cardiovascular exam Rhythm:Regular Rate:Normal     Neuro/Psych PSYCHIATRIC DISORDERS Anxiety Depression negative neurological ROS     GI/Hepatic negative GI ROS, Neg liver ROS, GERD  Medicated,  Endo/Other  Obesity   Renal/GU negative Renal ROS     Musculoskeletal  (+) Arthritis , Osteoarthritis,    Abdominal   Peds  Hematology negative hematology ROS (+)   Anesthesia Other Findings Day of surgery medications reviewed with the patient.  Reproductive/Obstetrics                            Anesthesia Physical Anesthesia Plan  ASA: II  Anesthesia Plan: General   Post-op Pain Management:    Induction: Intravenous  Airway Management Planned: Oral ETT  Additional Equipment:   Intra-op Plan:   Post-operative Plan: Extubation in OR  Informed Consent: I have reviewed the patients History and Physical, chart, labs and discussed the procedure including the risks, benefits and alternatives for the proposed anesthesia with the patient or authorized representative who has indicated his/her understanding and acceptance.   Dental advisory given  Plan Discussed with: CRNA  Anesthesia Plan Comments: (Risks/benefits of general anesthesia discussed with patient including risk of damage to teeth, lips, gum, and tongue, nausea/vomiting, allergic reactions to medications, and the  possibility of heart attack, stroke and death.  All patient questions answered.  Patient wishes to proceed.)        Anesthesia Quick Evaluation

## 2016-04-13 ENCOUNTER — Ambulatory Visit (HOSPITAL_COMMUNITY)
Admission: RE | Admit: 2016-04-13 | Discharge: 2016-04-14 | Disposition: A | Discharge status: Home / Self Care | Payer: BLUE CROSS/BLUE SHIELD | Source: Ambulatory Visit | Attending: General Surgery | Admitting: General Surgery

## 2016-04-13 ENCOUNTER — Encounter (HOSPITAL_COMMUNITY): Payer: Self-pay | Admitting: General Practice

## 2016-04-13 ENCOUNTER — Ambulatory Visit (HOSPITAL_COMMUNITY): Payer: BLUE CROSS/BLUE SHIELD | Admitting: Anesthesiology

## 2016-04-13 ENCOUNTER — Encounter (HOSPITAL_COMMUNITY)
Admission: RE | Disposition: A | Discharge status: Home / Self Care | Payer: Self-pay | Source: Ambulatory Visit | Attending: General Surgery

## 2016-04-13 DIAGNOSIS — K219 Gastro-esophageal reflux disease without esophagitis: Secondary | ICD-10-CM | POA: Diagnosis not present

## 2016-04-13 DIAGNOSIS — Z79899 Other long term (current) drug therapy: Secondary | ICD-10-CM | POA: Diagnosis not present

## 2016-04-13 DIAGNOSIS — Z9049 Acquired absence of other specified parts of digestive tract: Secondary | ICD-10-CM | POA: Diagnosis not present

## 2016-04-13 DIAGNOSIS — Z87891 Personal history of nicotine dependence: Secondary | ICD-10-CM | POA: Insufficient documentation

## 2016-04-13 DIAGNOSIS — Z96641 Presence of right artificial hip joint: Secondary | ICD-10-CM | POA: Diagnosis not present

## 2016-04-13 DIAGNOSIS — Z90710 Acquired absence of both cervix and uterus: Secondary | ICD-10-CM | POA: Diagnosis not present

## 2016-04-13 DIAGNOSIS — K430 Incisional hernia with obstruction, without gangrene: Secondary | ICD-10-CM | POA: Diagnosis not present

## 2016-04-13 DIAGNOSIS — K660 Peritoneal adhesions (postprocedural) (postinfection): Secondary | ICD-10-CM | POA: Diagnosis not present

## 2016-04-13 DIAGNOSIS — Z6831 Body mass index (BMI) 31.0-31.9, adult: Secondary | ICD-10-CM | POA: Diagnosis not present

## 2016-04-13 DIAGNOSIS — K432 Incisional hernia without obstruction or gangrene: Secondary | ICD-10-CM | POA: Diagnosis not present

## 2016-04-13 DIAGNOSIS — E669 Obesity, unspecified: Secondary | ICD-10-CM | POA: Insufficient documentation

## 2016-04-13 DIAGNOSIS — I10 Essential (primary) hypertension: Secondary | ICD-10-CM | POA: Diagnosis not present

## 2016-04-13 HISTORY — DX: Incisional hernia with obstruction, without gangrene: K43.0

## 2016-04-13 HISTORY — PX: INSERTION OF MESH: SHX5868

## 2016-04-13 HISTORY — PX: INCISIONAL HERNIA REPAIR: SHX193

## 2016-04-13 LAB — CBC
HEMATOCRIT: 35.9 % — AB (ref 36.0–46.0)
HEMOGLOBIN: 12.2 g/dL (ref 12.0–15.0)
MCH: 33.2 pg (ref 26.0–34.0)
MCHC: 34 g/dL (ref 30.0–36.0)
MCV: 97.6 fL (ref 78.0–100.0)
Platelets: 230 10*3/uL (ref 150–400)
RBC: 3.68 MIL/uL — ABNORMAL LOW (ref 3.87–5.11)
RDW: 13.2 % (ref 11.5–15.5)
WBC: 9 10*3/uL (ref 4.0–10.5)

## 2016-04-13 LAB — CREATININE, SERUM: Creatinine, Ser: 0.64 mg/dL (ref 0.44–1.00)

## 2016-04-13 SURGERY — REPAIR, HERNIA, INCISIONAL, LAPAROSCOPIC
Anesthesia: General

## 2016-04-13 MED ORDER — LIDOCAINE HCL (CARDIAC) 20 MG/ML IV SOLN
INTRAVENOUS | Status: DC | PRN
  Administered 2016-04-13: 50 mg via INTRAVENOUS

## 2016-04-13 MED ORDER — FENTANYL CITRATE (PF) 100 MCG/2ML IJ SOLN
25.0000 ug | INTRAMUSCULAR | Status: DC | PRN
  Administered 2016-04-13: 25 ug via INTRAVENOUS
  Administered 2016-04-13: 50 ug via INTRAVENOUS
  Administered 2016-04-13: 25 ug via INTRAVENOUS
  Administered 2016-04-13: 50 ug via INTRAVENOUS

## 2016-04-13 MED ORDER — HYDROCODONE-ACETAMINOPHEN 5-325 MG PO TABS
1.0000 | ORAL_TABLET | ORAL | Status: DC | PRN
  Administered 2016-04-13 – 2016-04-14 (×5): 2 via ORAL
  Filled 2016-04-13 (×5): qty 2

## 2016-04-13 MED ORDER — MIDAZOLAM HCL 2 MG/2ML IJ SOLN
INTRAMUSCULAR | Status: AC
  Filled 2016-04-13: qty 2

## 2016-04-13 MED ORDER — FENTANYL CITRATE (PF) 100 MCG/2ML IJ SOLN
INTRAMUSCULAR | Status: AC
  Filled 2016-04-13: qty 2

## 2016-04-13 MED ORDER — ACETAMINOPHEN 10 MG/ML IV SOLN
INTRAVENOUS | Status: AC
  Filled 2016-04-13: qty 100

## 2016-04-13 MED ORDER — ONDANSETRON HCL 4 MG/2ML IJ SOLN: 4.0000 mg | Freq: Four times a day (QID) | INTRAMUSCULAR | Status: DC | PRN

## 2016-04-13 MED ORDER — NAPHAZOLINE-GLYCERIN 0.012-0.2 % OP SOLN
1.0000 [drp] | Freq: Four times a day (QID) | OPHTHALMIC | Status: DC | PRN
  Filled 2016-04-13: qty 15

## 2016-04-13 MED ORDER — ONDANSETRON HCL 4 MG/2ML IJ SOLN
INTRAMUSCULAR | Status: DC | PRN
Start: 2016-04-13 — End: 2016-04-13
  Administered 2016-04-13: 4 mg via INTRAVENOUS

## 2016-04-13 MED ORDER — METOPROLOL SUCCINATE ER 50 MG PO TB24
50.0000 mg | ORAL_TABLET | Freq: Every day | ORAL | Status: DC
  Administered 2016-04-14: 50 mg via ORAL
  Filled 2016-04-13 (×2): qty 1

## 2016-04-13 MED ORDER — PHENYLEPHRINE 40 MCG/ML (10ML) SYRINGE FOR IV PUSH (FOR BLOOD PRESSURE SUPPORT)
PREFILLED_SYRINGE | INTRAVENOUS | Status: AC
  Filled 2016-04-13: qty 10

## 2016-04-13 MED ORDER — PANTOPRAZOLE SODIUM 40 MG PO TBEC: 40.0000 mg | DELAYED_RELEASE_TABLET | Freq: Every day | ORAL | Status: DC

## 2016-04-13 MED ORDER — SENNA 8.6 MG PO TABS
1.0000 | ORAL_TABLET | Freq: Two times a day (BID) | ORAL | Status: DC
  Administered 2016-04-14: 8.6 mg via ORAL
  Filled 2016-04-13: qty 1

## 2016-04-13 MED ORDER — FENTANYL CITRATE (PF) 100 MCG/2ML IJ SOLN
25.0000 ug | INTRAMUSCULAR | Status: DC | PRN
Start: 2016-04-13 — End: 2016-04-14
  Administered 2016-04-13 – 2016-04-14 (×3): 25 ug via INTRAVENOUS
  Filled 2016-04-13 (×3): qty 2

## 2016-04-13 MED ORDER — ONDANSETRON 4 MG PO TBDP: 4.0000 mg | ORAL_TABLET | Freq: Four times a day (QID) | ORAL | Status: DC | PRN

## 2016-04-13 MED ORDER — ONDANSETRON HCL 4 MG/2ML IJ SOLN
INTRAMUSCULAR | Status: AC
  Filled 2016-04-13: qty 2

## 2016-04-13 MED ORDER — ROCURONIUM BROMIDE 100 MG/10ML IV SOLN
INTRAVENOUS | Status: DC | PRN
  Administered 2016-04-13: 20 mg via INTRAVENOUS
  Administered 2016-04-13: 50 mg via INTRAVENOUS

## 2016-04-13 MED ORDER — SUGAMMADEX SODIUM 200 MG/2ML IV SOLN
INTRAVENOUS | Status: AC
  Filled 2016-04-13: qty 4

## 2016-04-13 MED ORDER — BUPIVACAINE-EPINEPHRINE 0.5% -1:200000 IJ SOLN
INTRAMUSCULAR | Status: DC | PRN
  Administered 2016-04-13: 10 mL
  Administered 2016-04-13: 20 mL

## 2016-04-13 MED ORDER — FENTANYL CITRATE (PF) 100 MCG/2ML IJ SOLN
INTRAMUSCULAR | Status: DC | PRN
Start: 2016-04-13 — End: 2016-04-13
  Administered 2016-04-13 (×2): 100 ug via INTRAVENOUS
  Administered 2016-04-13: 50 ug via INTRAVENOUS

## 2016-04-13 MED ORDER — PROPOFOL 10 MG/ML IV BOLUS
INTRAVENOUS | Status: DC | PRN
  Administered 2016-04-13: 150 mg via INTRAVENOUS

## 2016-04-13 MED ORDER — PROPOFOL 10 MG/ML IV BOLUS
INTRAVENOUS | Status: AC
  Filled 2016-04-13: qty 20

## 2016-04-13 MED ORDER — FENTANYL CITRATE (PF) 250 MCG/5ML IJ SOLN
INTRAMUSCULAR | Status: AC
  Filled 2016-04-13: qty 5

## 2016-04-13 MED ORDER — CHLORHEXIDINE GLUCONATE 4 % EX LIQD: 1.0000 "application " | Freq: Once | CUTANEOUS | Status: DC

## 2016-04-13 MED ORDER — CEFAZOLIN SODIUM-DEXTROSE 2-4 GM/100ML-% IV SOLN
2.0000 g | Freq: Three times a day (TID) | INTRAVENOUS | Status: AC
  Administered 2016-04-13: 2 g via INTRAVENOUS
  Filled 2016-04-13: qty 100

## 2016-04-13 MED ORDER — CALCIUM CARBONATE-VITAMIN D 500-200 MG-UNIT PO TABS
1.0000 | ORAL_TABLET | Freq: Every day | ORAL | Status: DC
  Administered 2016-04-14: 1 via ORAL
  Filled 2016-04-13 (×2): qty 1

## 2016-04-13 MED ORDER — ESOMEPRAZOLE MAGNESIUM 40 MG PO CPDR
40.0000 mg | DELAYED_RELEASE_CAPSULE | Freq: Every day | ORAL | Status: DC
  Administered 2016-04-14: 40 mg via ORAL
  Filled 2016-04-13 (×2): qty 1

## 2016-04-13 MED ORDER — CEFAZOLIN SODIUM-DEXTROSE 2-4 GM/100ML-% IV SOLN
2.0000 g | INTRAVENOUS | Status: AC
  Administered 2016-04-13: 2 g via INTRAVENOUS

## 2016-04-13 MED ORDER — CEFAZOLIN SODIUM-DEXTROSE 2-4 GM/100ML-% IV SOLN
INTRAVENOUS | Status: AC
Start: 2016-04-13 — End: 2016-04-13
  Filled 2016-04-13: qty 100

## 2016-04-13 MED ORDER — SUGAMMADEX SODIUM 200 MG/2ML IV SOLN
INTRAVENOUS | Status: DC | PRN
  Administered 2016-04-13: 350 mg via INTRAVENOUS

## 2016-04-13 MED ORDER — MIDAZOLAM HCL 5 MG/5ML IJ SOLN
INTRAMUSCULAR | Status: DC | PRN
  Administered 2016-04-13: 2 mg via INTRAVENOUS

## 2016-04-13 MED ORDER — ACETAMINOPHEN 10 MG/ML IV SOLN
INTRAVENOUS | Status: DC | PRN
  Administered 2016-04-13: 1000 mg via INTRAVENOUS

## 2016-04-13 MED ORDER — MELATONIN 5 MG PO TABS: 5.0000 mg | ORAL_TABLET | Freq: Every evening | ORAL | Status: DC | PRN

## 2016-04-13 MED ORDER — LIDOCAINE HCL (CARDIAC) 20 MG/ML IV SOLN
INTRAVENOUS | Status: AC
  Filled 2016-04-13: qty 5

## 2016-04-13 MED ORDER — METHOCARBAMOL 500 MG PO TABS: 500.0000 mg | ORAL_TABLET | Freq: Four times a day (QID) | ORAL | Status: DC | PRN

## 2016-04-13 MED ORDER — PHENYLEPHRINE HCL 10 MG/ML IJ SOLN
INTRAMUSCULAR | Status: DC | PRN
  Administered 2016-04-13 (×4): 80 ug via INTRAVENOUS

## 2016-04-13 MED ORDER — BUPIVACAINE-EPINEPHRINE (PF) 0.5% -1:200000 IJ SOLN
INTRAMUSCULAR | Status: AC
  Filled 2016-04-13: qty 30

## 2016-04-13 MED ORDER — POLYETHYLENE GLYCOL 3350 17 G PO PACK: 17.0000 g | PACK | Freq: Every day | ORAL | Status: DC | PRN

## 2016-04-13 MED ORDER — ONDANSETRON HCL 4 MG/2ML IJ SOLN: 4.0000 mg | Freq: Once | INTRAMUSCULAR | Status: DC | PRN

## 2016-04-13 MED ORDER — LACTATED RINGERS IV SOLN
INTRAVENOUS | Status: DC | PRN
  Administered 2016-04-13 (×2): via INTRAVENOUS

## 2016-04-13 MED ORDER — 0.9 % SODIUM CHLORIDE (POUR BTL) OPTIME
TOPICAL | Status: DC | PRN
  Administered 2016-04-13: 1000 mL

## 2016-04-13 MED ORDER — ENOXAPARIN SODIUM 40 MG/0.4ML ~~LOC~~ SOLN
40.0000 mg | SUBCUTANEOUS | Status: DC
  Administered 2016-04-14: 40 mg via SUBCUTANEOUS
  Filled 2016-04-13 (×2): qty 0.4

## 2016-04-13 MED ORDER — POTASSIUM CHLORIDE IN NACL 20-0.9 MEQ/L-% IV SOLN
INTRAVENOUS | Status: DC
  Administered 2016-04-13 (×2): via INTRAVENOUS
  Filled 2016-04-13 (×4): qty 1000

## 2016-04-13 SURGICAL SUPPLY — 38 items
ADH SKN CLS APL DERMABOND .7 (GAUZE/BANDAGES/DRESSINGS) ×1
APPLIER CLIP 5 13 M/L LIGAMAX5 (MISCELLANEOUS)
APR CLP MED LRG 5 ANG JAW (MISCELLANEOUS)
BINDER ABDOMINAL 12 ML 46-62 (SOFTGOODS) ×3 IMPLANT
CHLORAPREP W/TINT 26ML (MISCELLANEOUS) ×3 IMPLANT
CLIP APPLIE 5 13 M/L LIGAMAX5 (MISCELLANEOUS) IMPLANT
COVER SURGICAL LIGHT HANDLE (MISCELLANEOUS) ×3 IMPLANT
DECANTER SPIKE VIAL GLASS SM (MISCELLANEOUS) ×1 IMPLANT
DERMABOND ADVANCED (GAUZE/BANDAGES/DRESSINGS) ×2
DERMABOND ADVANCED .7 DNX12 (GAUZE/BANDAGES/DRESSINGS) ×1 IMPLANT
DEVICE SECURE STRAP 25 ABSORB (INSTRUMENTS) ×6 IMPLANT
DEVICE TROCAR PUNCTURE CLOSURE (ENDOMECHANICALS) ×3 IMPLANT
DISSECTOR BLUNT TIP ENDO 5MM (MISCELLANEOUS) IMPLANT
DRAPE LAPAROSCOPIC ABDOMINAL (DRAPES) ×3 IMPLANT
ELECT REM PT RETURN 9FT ADLT (ELECTROSURGICAL) ×3
ELECTRODE REM PT RTRN 9FT ADLT (ELECTROSURGICAL) ×1 IMPLANT
GLOVE EUDERMIC 7 POWDERFREE (GLOVE) ×3 IMPLANT
GOWN STRL REUS W/TWL XL LVL3 (GOWN DISPOSABLE) ×9 IMPLANT
KIT BASIN OR (CUSTOM PROCEDURE TRAY) ×3 IMPLANT
MARKER SKIN DUAL TIP RULER LAB (MISCELLANEOUS) ×3 IMPLANT
MESH VENTRALIGHT ST 8X10 (Mesh General) ×2 IMPLANT
NDL SPNL 22GX3.5 QUINCKE BK (NEEDLE) ×1 IMPLANT
NEEDLE SPNL 22GX3.5 QUINCKE BK (NEEDLE) ×3 IMPLANT
PAD POSITIONING PINK XL (MISCELLANEOUS) ×2 IMPLANT
POSITIONER SURGICAL ARM (MISCELLANEOUS) IMPLANT
SCISSORS LAP 5X35 DISP (ENDOMECHANICALS) ×3 IMPLANT
SET IRRIG TUBING LAPAROSCOPIC (IRRIGATION / IRRIGATOR) IMPLANT
SHEARS HARMONIC ACE PLUS 36CM (ENDOMECHANICALS) ×2 IMPLANT
SLEEVE XCEL OPT CAN 5 100 (ENDOMECHANICALS) ×9 IMPLANT
SUT NOVA NAB DX-16 0-1 5-0 T12 (SUTURE) IMPLANT
SUT NOVA NAB GS-21 0 18 T12 DT (SUTURE) ×4 IMPLANT
TAPE CLOTH 4X10 WHT NS (GAUZE/BANDAGES/DRESSINGS) IMPLANT
TOWEL OR 17X26 10 PK STRL BLUE (TOWEL DISPOSABLE) ×3 IMPLANT
TRAY LAPAROSCOPIC (CUSTOM PROCEDURE TRAY) ×3 IMPLANT
TROCAR BLADELESS OPT 5 100 (ENDOMECHANICALS) ×3 IMPLANT
TROCAR XCEL BLUNT TIP 100MML (ENDOMECHANICALS) IMPLANT
TROCAR XCEL NON-BLD 11X100MML (ENDOMECHANICALS) ×2 IMPLANT
TUBING INSUFFLATOR HI FLOW (MISCELLANEOUS) ×2 IMPLANT

## 2016-04-13 NOTE — Transfer of Care (Signed)
Immediate Anesthesia Transfer of Care Note  Patient: Ana Ryan  Procedure(s) Performed: Procedure(s): LAPAROSCOPIC INCISIONAL HERNIA WITH MESH  (N/A) INSERTION OF MES,lysis of adhesions (N/A)  Patient Location: PACU  Anesthesia Type:General  Level of Consciousness: awake, alert  and oriented  Airway & Oxygen Therapy: Patient Spontanous Breathing and Patient connected to face mask oxygen  Post-op Assessment: Report given to RN and Post -op Vital signs reviewed and stable  Post vital signs: Reviewed and stable  Last Vitals:  Filed Vitals:   04/13/16 0616  BP: 118/87  Pulse: 90  Temp: 36.7 C  Resp: 18    Last Pain: There were no vitals filed for this visit.       Complications: No apparent anesthesia complications

## 2016-04-13 NOTE — Progress Notes (Signed)
5th floor notified pt will be in 1531 in 20 minutes.

## 2016-04-13 NOTE — Interval H&P Note (Signed)
History and Physical Interval Note:  04/13/2016 7:42 AM  Ana Ryan  has presented today for surgery, with the diagnosis of Incisional hernia XL  The various methods of treatment have been discussed with the patient and family. After consideration of risks, benefits and other options for treatment, the patient has consented to  Procedure(s): LAPAROSCOPIC INCISIONAL HERNIA WITH MESH  (N/A) INSERTION OF MESH (N/A) , possible open repair as a surgical intervention .  The patient's history has been reviewed, patient examined, no change in status, stable for surgery.  I have reviewed the patient's chart and labs.  Questions were answered to the patient's satisfaction.     Ernestene MentionINGRAM,Kasen Sako M

## 2016-04-13 NOTE — Anesthesia Procedure Notes (Signed)
Procedure Name: Intubation Performed by: Rasheen Bells J Pre-anesthesia Checklist: Patient identified, Emergency Drugs available, Suction available, Patient being monitored and Timeout performed Patient Re-evaluated:Patient Re-evaluated prior to inductionOxygen Delivery Method: Circle system utilized Preoxygenation: Pre-oxygenation with 100% oxygen Intubation Type: IV induction Ventilation: Mask ventilation without difficulty Laryngoscope Size: Mac and 3 Grade View: Grade I Tube type: Oral Tube size: 7.0 mm Number of attempts: 1 Airway Equipment and Method: Stylet Placement Confirmation: ETT inserted through vocal cords under direct vision,  positive ETCO2,  CO2 detector and breath sounds checked- equal and bilateral Secured at: 21 cm Tube secured with: Tape Dental Injury: Teeth and Oropharynx as per pre-operative assessment        

## 2016-04-13 NOTE — Progress Notes (Signed)
PHARMACIST - PHYSICIAN ORDER COMMUNICATION  CONCERNING: P&T Medication Policy on Herbal Medications  DESCRIPTION:  This patient's order for: Melatonin has been noted.  This product(s) is classified as an "herbal" or natural product. Due to a lack of definitive safety studies or FDA approval, nonstandard manufacturing practices, plus the potential risk of unknown drug-drug interactions while on inpatient medications, the Pharmacy and Therapeutics Committee does not permit the use of "herbal" or natural products of this type within [facility].   ACTION TAKEN: The pharmacy department is unable to verify this order at this time and your patient has been informed of this safety policy. Please reevaluate patient's clinical condition at discharge and address if the herbal or natural product(s) should be resumed at that time.   Greer PickerelJigna Debra Calabretta, PharmD, BCPS Pager: [phone] 04/13/2016 1:30 PM

## 2016-04-13 NOTE — Anesthesia Postprocedure Evaluation (Signed)
Anesthesia Post Note  Patient: Ana Ryan  Procedure(s) Performed: Procedure(s) (LRB): LAPAROSCOPIC INCISIONAL HERNIA WITH MESH  (N/A) INSERTION OF MES,lysis of adhesions (N/A)  Patient location during evaluation: PACU Anesthesia Type: General Level of consciousness: awake and alert Pain management: pain level controlled Vital Signs Assessment: post-procedure vital signs reviewed and stable Respiratory status: spontaneous breathing, nonlabored ventilation, respiratory function stable and patient connected to nasal cannula oxygen Cardiovascular status: blood pressure returned to baseline and stable Postop Assessment: no signs of nausea or vomiting Anesthetic complications: no    Last Vitals:  Filed Vitals:   04/13/16 1213 04/13/16 1315  BP: 141/92 114/70  Pulse: 72 74  Temp: 36.4 C 36.7 C  Resp: 12 16    Last Pain:  Filed Vitals:   04/13/16 1327  PainSc: Asleep                 Cecile HearingStephen Edward Makiah Clauson

## 2016-04-13 NOTE — Op Note (Signed)
Patient Name:           Ana Ryan   Date of Surgery:        04/13/2016  Pre op Diagnosis:      Multiple incarcerated ventral incisional hernias   Post op Diagnosis:    Same  Procedure:                 Laparoscopic lysis of adhesions, laparoscopic reduction and repair of 2 incarcerated ventral incisional hernias                                      Implantation of ventraLite ST mesh, size 25 cm x 20 cm  Surgeon:                     Angelia MouldHaywood M. Derrell LollingIngram, M.D., FACS  Assistant:                      OR staff  Operative Indications:   This is a 60 year old female who returns for evaluation of her ventral incisional hernias following CT scan.      This is a patient of Dr. Julieanne Mansonichard Gilbert in [facility]. She's had 3 cesarean sections through a Pfannenstiel incision. She's had laparoscopic appendectomy in the past for a perforated appendix requiring six-day hospitalization. She had a laparoscopic-assisted vaginal hysterectomy without BSO. She is noticed painless lumps in her upper abdomen in the midline above the umbilicus. When I saw her on March 09, 2016 and felt that she had 2 incisional hernias in the midline above the umbilicus.      CT scan confirms 2 fat-containing hernias, relatively small hernia neck and sacs no more than 2.5 cm just above the umbilicus. There is a second one with a hernia neck 1-2 cm in diameter and a sac measuring 5.5 cm. Both of these contained fat only. She has no gastrointestinal symptoms.      She states that she is motivated to have these repaired electively. I think because the hernias were not that large she is a good candidate for laparoscopic repair. Comorbidities include BMI 27, GERD, degenerative disc disease, borderline hypertension, former smoker. Previous multiple abdominal operations as mentioned above. Right total hip replacement with good result.  Operative Findings:       There was a smaller hernia at the umbilicus containing incarcerated  omentum.  There was a 2-3 cm diameter hernia defect in the mid epigastrium below one of her laparoscopic incisions containing incarcerated omentum and falciform.  There were moderate adhesions in the pelvis and left lateral abdomen that had to be taken down to gain exposure and trocar access.  There was no evidence of any injury to the intestinal tract.  There was no bleeding at the completion of the case.  Procedure in Detail:          Following the induction of general endotracheal anesthesia the patient's abdomen was prepped and draped in a sterile fashion.  Surgical timeout was performed.  Intravenous antibiotics were given.  0.5% Marcaine with epinephrine was used as local infiltration anesthetic.     A 5 mm optical port was placed in the left subcostal site.  Optical entry was uneventful.  Insufflation was uneventful.  There was no evidence of injury or bleeding.  I placed 2 more trochars on the left side and 2 trochars on the right side ultimately.  We had to take down a moderate adhesions in the left upper and left lower quadrant to place the trochars.  This was done with blunt dissection, sharp dissection and the harmonic scalpel.  These were all omental adhesions.  I then reduced the hernia contents from the 2 hernias.  I could define the hernias.  Using a spinal needle I measured the entire defect between the 2 hernias and then drew a template on the abdominal wall to give a 7 cm overlap in all directions.  I brought a 25 cm x 20 cm ventraLite ST mesh to the operative field.  I drew a template on the abdominal wall and found that I could trim the mesh down to size a little bit in all directions.  I marked 6 equidistant suture fixation sites on the abdominal wall in the mesh.  I placed 6 sutures of #1 Novafil in the mesh.  Great care was taken to place the mesh so the rough side was up toward the abdominal wall and the smooth side down toward the viscera.  After all of the sutures were placed the mesh  was moistened rolled up and inserted into the abdominal cavity and then spread out and positioned according to the drawn template.    A small incision was made at the 6 suture fixation sites.  Using the Endo Close device I passed the Novafil sutures up through the abdominal wall.  I took care to take about a 1 cm bite of fascia at all of the suture fixation sites.  After all 6 were placed I drew the sutures up and the mesh deployed nicely with very little redundancy.  I tied all 6 sutures and they tied down securely.  I then further secured the mesh with the secure strap device.  I placed 75 firings of the secure strap device in a double crown technique.  After this was done everything looked quite secure.  I checked circumferentially a couple of times and there were no gaps or defects.  There was no bleeding.  Pneumoperitoneum was released and the trochars were removed.  All of the trocar sites were closed with skin staples.  Abdominal binder was placed and the patient taken to PACU in stable condition.  EBL 25 mL or less.  Counts correct.  Complications none.     Angelia Mould. Derrell Lolling, M.D., FACS General and Minimally Invasive Surgery Breast and Colorectal Surgery  04/13/2016 9:45 AM

## 2016-04-14 DIAGNOSIS — Z79899 Other long term (current) drug therapy: Secondary | ICD-10-CM | POA: Diagnosis not present

## 2016-04-14 DIAGNOSIS — E669 Obesity, unspecified: Secondary | ICD-10-CM | POA: Diagnosis not present

## 2016-04-14 DIAGNOSIS — Z90710 Acquired absence of both cervix and uterus: Secondary | ICD-10-CM | POA: Diagnosis not present

## 2016-04-14 DIAGNOSIS — Z9049 Acquired absence of other specified parts of digestive tract: Secondary | ICD-10-CM | POA: Diagnosis not present

## 2016-04-14 DIAGNOSIS — Z6831 Body mass index (BMI) 31.0-31.9, adult: Secondary | ICD-10-CM | POA: Diagnosis not present

## 2016-04-14 DIAGNOSIS — K430 Incisional hernia with obstruction, without gangrene: Secondary | ICD-10-CM | POA: Diagnosis not present

## 2016-04-14 DIAGNOSIS — I10 Essential (primary) hypertension: Secondary | ICD-10-CM | POA: Diagnosis not present

## 2016-04-14 DIAGNOSIS — K660 Peritoneal adhesions (postprocedural) (postinfection): Secondary | ICD-10-CM | POA: Diagnosis not present

## 2016-04-14 DIAGNOSIS — Z87891 Personal history of nicotine dependence: Secondary | ICD-10-CM | POA: Diagnosis not present

## 2016-04-14 DIAGNOSIS — Z96641 Presence of right artificial hip joint: Secondary | ICD-10-CM | POA: Diagnosis not present

## 2016-04-14 DIAGNOSIS — K219 Gastro-esophageal reflux disease without esophagitis: Secondary | ICD-10-CM | POA: Diagnosis not present

## 2016-04-14 MED ORDER — HYDROCODONE-ACETAMINOPHEN 5-325 MG PO TABS: 1.0000 | ORAL_TABLET | Freq: Four times a day (QID) | ORAL | Status: DC | PRN

## 2016-04-14 NOTE — Discharge Summary (Signed)
Patient ID: Ana Ryan [id number] 59 y.o. 1956/09/25  Admit date: 04/13/2016  Discharge date and time: 04/14/2016  Admitting Physician: Ernestene Mention  Discharge Physician: Ernestene Mention  Admission Diagnoses: Incisional hernia   Discharge Diagnoses: Multiple incarcerated incisional ventral hernias                                         History laparoscopic-assisted vaginal hysterectomy                                         History laparoscopic appendectomy for perforated appendicitis                                          History of 3 cesarean sections  Operations: Procedure(s): LAPAROSCOPIC INCISIONAL HERNIA WITH MESH  INSERTION OF MES,lysis of adhesions  Admission Condition: good  Discharged Condition: good  Indication for Admission:   This is a patient of Dr. Julieanne Manson in [facility]. She's had 3 cesarean sections through a Pfannenstiel incision. She's had laparoscopic appendectomy in the past for a perforated appendix requiring six-day hospitalization. She had a laparoscopic-assisted vaginal hysterectomy without BSO. She is noticed painless lumps in her upper abdomen in the midline above the umbilicus. When I saw her on March 09, 2016 and felt that she had 2 incisional hernias in the midline above the umbilicus.  CT scan confirms 2 fat-containing hernias, relatively small hernia neck and sacs no more than 2.5 cm just above the umbilicus. There is a second one with a hernia neck 1-2 cm in diameter and a sac measuring 5.5 cm. Both of these contained fat only. She has no gastrointestinal symptoms.  She states that she is motivated to have these repaired electively. I think because the hernias were not that large she is a good candidate for laparoscopic repair. Comorbidities include BMI 27, GERD, degenerative disc disease, borderline hypertension, former smoker. Previous multiple abdominal operations as mentioned above. Right total hip  replacement with good result.  Hospital Course: On the day of admission the patient was taken to the operating room and underwent laparoscopic lysis of adhesions, and laparoscopic repair of her incarcerated incisional hernias.  She had a smaller hernia at the umbilicus and a larger hernia in the midline under one of her laparoscopy scars.  There was incarcerated fatty tissue.  The mesh repair was performed uneventfully.  She did very well overnight.  Pain was well controlled.  She was able to ambulate and urinate without difficulty.  She was beginning to eat  and said she was hungry and had no nausea whatsoever.  She requested that she be discharged home on postop day 1.  She was doing well but decided to be sure that she can tolerate her diet and so the plan is to discharge home after breakfast and lunch.  Physical exam on the day of discharge revealed her lungs were clear and her abdomen was soft and nondistended and all of the wounds looked good.    She was given instructions in diet and activities.  She was given a prescription for hydrocodone for pain.  She was asked to return to the office in approximately 10 days to get  the staples out.  Restrictions were discussed.  Consults: None  Significant Diagnostic Studies: Preop lab work  Treatments: surgery: Laparoscopic repair of incarcerated incisional hernia with mesh  Disposition: Home  Patient Instructions:    Medication List    TAKE these medications        esomeprazole 40 MG capsule  Commonly known as:  NEXIUM  Take 40 mg by mouth daily. No product substitutions"Nexium only"     HYDROcodone-acetaminophen 5-325 MG tablet  Commonly known as:  NORCO  Take 1-2 tablets by mouth every 6 (six) hours as needed.     ibuprofen 200 MG tablet  Commonly known as:  ADVIL,MOTRIN  Take 400 mg by mouth every 6 (six) hours as needed (For pain.).     Melatonin 5 MG Tabs  Take 5 mg by mouth at bedtime as needed (For sleep.).     metoprolol  succinate 50 MG 24 hr tablet  Commonly known as:  TOPROL-XL  Take 1 tablet (50 mg total) by mouth daily.     naphazoline 0.1 % ophthalmic solution  Commonly known as:  NAPHCON  Place 2 drops into both eyes 4 (four) times daily as needed for irritation or allergies.     OSCAL 500/200 D-3 500-200 MG-UNIT tablet  Generic drug:  calcium-vitamin D  Take 1 tablet by mouth daily with breakfast.        Activity: Unlimited ambulation.  No driving for 1 week.  No sports or heavy lifting for 5 weeks Diet: low fat, low cholesterol diet Wound Care: none needed  Follow-up:  With Dr. Derrell LollingIngram in 10 days.  Signed: Angelia MouldHaywood M. Derrell LollingIngram, M.D., FACS General and minimally invasive surgery Breast and Colorectal Surgery  04/14/2016, 6:14 AM

## 2016-04-14 NOTE — Progress Notes (Signed)
Discharge instructions given to patient along with prescriptions.  Questions answered 

## 2016-04-14 NOTE — Discharge Instructions (Signed)
-  see above 

## 2016-06-02 ENCOUNTER — Other Ambulatory Visit: Payer: Self-pay | Admitting: Family Medicine

## 2016-06-28 DIAGNOSIS — M25552 Pain in left hip: Secondary | ICD-10-CM | POA: Diagnosis not present

## 2016-07-05 ENCOUNTER — Other Ambulatory Visit: Payer: Self-pay | Admitting: Family Medicine

## 2016-07-05 NOTE — Telephone Encounter (Signed)
#  15--thanks--3 rf.

## 2016-07-05 NOTE — Telephone Encounter (Signed)
Pt stated that she has been having trouble sleeping. Pt stated in the past Dr. Sullivan LoneGilbert has written an RX for Ambien and she would like a 2 week supply sent to [facility]. Pt stated that she doesn't want to start taking this every night she just would like to try to get herself regulated. Please advise. Thanks TNP

## 2016-07-05 NOTE — Telephone Encounter (Signed)
Medication sent. Pt informed 

## 2016-07-28 DIAGNOSIS — M25552 Pain in left hip: Secondary | ICD-10-CM | POA: Diagnosis not present

## 2016-08-24 DIAGNOSIS — M25552 Pain in left hip: Secondary | ICD-10-CM | POA: Diagnosis not present

## 2016-08-24 DIAGNOSIS — M545 Low back pain: Secondary | ICD-10-CM | POA: Diagnosis not present

## 2016-08-30 DIAGNOSIS — M5136 Other intervertebral disc degeneration, lumbar region: Secondary | ICD-10-CM | POA: Diagnosis not present

## 2016-08-30 DIAGNOSIS — M4696 Unspecified inflammatory spondylopathy, lumbar region: Secondary | ICD-10-CM | POA: Diagnosis not present

## 2016-08-30 DIAGNOSIS — M1612 Unilateral primary osteoarthritis, left hip: Secondary | ICD-10-CM | POA: Diagnosis not present

## 2016-09-13 DIAGNOSIS — Z96642 Presence of left artificial hip joint: Secondary | ICD-10-CM | POA: Diagnosis not present

## 2016-09-13 DIAGNOSIS — Z471 Aftercare following joint replacement surgery: Secondary | ICD-10-CM | POA: Diagnosis not present

## 2016-09-15 DIAGNOSIS — M1612 Unilateral primary osteoarthritis, left hip: Secondary | ICD-10-CM | POA: Diagnosis not present

## 2016-09-15 DIAGNOSIS — M25552 Pain in left hip: Secondary | ICD-10-CM | POA: Diagnosis not present

## 2016-09-29 DIAGNOSIS — M25552 Pain in left hip: Secondary | ICD-10-CM | POA: Diagnosis not present

## 2016-10-03 ENCOUNTER — Ambulatory Visit (INDEPENDENT_AMBULATORY_CARE_PROVIDER_SITE_OTHER): Payer: BLUE CROSS/BLUE SHIELD | Admitting: Family Medicine

## 2016-10-03 ENCOUNTER — Encounter: Payer: Self-pay | Admitting: Family Medicine

## 2016-10-03 VITALS — BP 110/82 | HR 64 | Temp 98.0°F | Resp 16 | Wt 160.0 lb

## 2016-10-03 DIAGNOSIS — I10 Essential (primary) hypertension: Secondary | ICD-10-CM

## 2016-10-03 DIAGNOSIS — M1612 Unilateral primary osteoarthritis, left hip: Secondary | ICD-10-CM | POA: Diagnosis not present

## 2016-10-03 NOTE — Progress Notes (Signed)
Patient: Ana Ryan Female    DOB: November 15, 1956   60 y.o.   MRN: [id number] Visit Date: 10/03/2016  Today's Provider: Megan Mansichard Gilbert Jr, MD   Chief Complaint  Patient presents with  . Pre-op Exam   Subjective:    HPI   Preoperative Examination Pt is having left hip replacement surgery on 10/13/2016, being performed by Dr. Charlann Boxerlin. Pt denies ASA/anti-coagulant use. Pt has never had adverse reaction to anesthesia. Pt reports she is going tomorrow to have her pre-op done by her surgeon's office at [facility].  Allergies  Allergen Reactions  . Dilaudid [Hydromorphone Hcl] Other (See Comments)    hallucinations     Current Outpatient Prescriptions:  .  esomeprazole (NEXIUM) 40 MG capsule, Take 40 mg by mouth daily. No product substitutions"Nexium only", Disp: , Rfl:  .  ibuprofen (ADVIL,MOTRIN) 200 MG tablet, Take 400 mg by mouth every 6 (six) hours as needed (For pain.)., Disp: , Rfl:  .  metoprolol succinate (TOPROL-XL) 50 MG 24 hr tablet, TAKE 1 TABLET EACH DAY., Disp: 30 tablet, Rfl: 12 .  naphazoline (NAPHCON) 0.1 % ophthalmic solution, Place 2 drops into both eyes 4 (four) times daily as needed for irritation or allergies., Disp: , Rfl:  .  Polyvinyl Alcohol (LIQUID TEARS OP), Apply to eye., Disp: , Rfl:   Review of Systems  Constitutional: Positive for activity change (due to hip pain) and unexpected weight change (gain). Negative for appetite change, chills, diaphoresis, fatigue and fever.  Eyes: Negative.   Respiratory: Negative.  Negative for cough, shortness of breath and wheezing.   Cardiovascular: Negative.  Negative for chest pain, palpitations and leg swelling.  Gastrointestinal: Negative.   Genitourinary: Negative for dysuria.  Musculoskeletal: Positive for arthralgias.  Allergic/Immunologic: Negative.   Hematological: Negative.   Psychiatric/Behavioral: Negative.     Social History  Substance Use Topics  . Smoking status: Former Games developermoker    . Smokeless tobacco: Never Used     Comment: did not smoke regularly  . Alcohol use Yes     Comment: rare social   Objective:   BP 110/82 (BP Location: Left Arm, Patient Position: Sitting, Cuff Size: Normal)   Pulse 64   Temp 98 F (36.7 C) (Oral)   Resp 16   Wt 160 lb (72.6 kg)   BMI 29.26 kg/m   Physical Exam  Constitutional: She is oriented to person, place, and time. She appears well-developed and well-nourished.  HENT:  Head: Normocephalic and atraumatic.  Eyes: Conjunctivae are normal. No scleral icterus.  Neck: No thyromegaly present.  Cardiovascular: Normal rate, regular rhythm and normal heart sounds.   Pulmonary/Chest: Effort normal and breath sounds normal.  Abdominal: Soft.  Lymphadenopathy:    She has no cervical adenopathy.  Neurological: She is alert and oriented to person, place, and time.  Skin: Skin is warm and dry.  Psychiatric: She has a normal mood and affect. Her behavior is normal. Judgment and thought content normal.        Assessment & Plan:     Osteoarthritis Patient medically clear for left hip replacement Hypertension Alcoholism In remission   I have done the exam and reviewed the chart and it is accurate to the best of my knowledge. Julieanne Mansonichard Gilbert M.D. [facility]      Patient seen and examined by Julieanne Mansonichard Gilbert, MD, and note scribed by Allene DillonEmily Drozdowski, CMA.   Richard Wendelyn BreslowGilbert Jr, MD  [facility]

## 2016-10-04 DIAGNOSIS — Z0000 Encounter for general adult medical examination without abnormal findings: Secondary | ICD-10-CM | POA: Diagnosis not present

## 2016-10-05 DIAGNOSIS — H04123 Dry eye syndrome of bilateral lacrimal glands: Secondary | ICD-10-CM | POA: Diagnosis not present

## 2016-10-05 DIAGNOSIS — H5203 Hypermetropia, bilateral: Secondary | ICD-10-CM | POA: Diagnosis not present

## 2016-10-05 DIAGNOSIS — H52203 Unspecified astigmatism, bilateral: Secondary | ICD-10-CM | POA: Diagnosis not present

## 2016-10-05 DIAGNOSIS — D3131 Benign neoplasm of right choroid: Secondary | ICD-10-CM | POA: Diagnosis not present

## 2016-10-10 ENCOUNTER — Telehealth: Payer: Self-pay | Admitting: Family Medicine

## 2016-10-10 NOTE — Telephone Encounter (Signed)
Pt was in recently for release for surgery with [facility].  She said dr, Sullivan LoneGilbert signed the paper but [facility] have not received the letter yet.  It was supposed to be faxed.  She is having the surgery This Thursday.  The letter needs to be faxed ASAP so she can still have the surgery as scheduled.  Pt's call back [phone].  {Please call pt and let her know when it has been faxed.  Thanks Barth Kirkseri

## 2016-10-10 NOTE — Telephone Encounter (Signed)
This is done. -aa 

## 2016-10-13 DIAGNOSIS — Z96651 Presence of right artificial knee joint: Secondary | ICD-10-CM | POA: Diagnosis not present

## 2016-10-13 DIAGNOSIS — M1612 Unilateral primary osteoarthritis, left hip: Secondary | ICD-10-CM | POA: Diagnosis not present

## 2016-10-18 ENCOUNTER — Other Ambulatory Visit: Payer: Self-pay | Admitting: Family Medicine

## 2016-10-18 MED ORDER — ESOMEPRAZOLE MAGNESIUM 40 MG PO CPDR: 40.0000 mg | DELAYED_RELEASE_CAPSULE | Freq: Every day | ORAL | 12 refills | Status: DC

## 2016-10-18 NOTE — Telephone Encounter (Signed)
Pt advised and RX sent in-aa 

## 2016-10-18 NOTE — Telephone Encounter (Signed)
Pt needs refill on her Nexium 40mg .  She needs this sent in today because her insurance changes tomorrow.  She said she realizes that you are not the subscriber but they said they have to see her to refill it and she just had surgery and can not go to their office.  [facility] [place]./  Pt CB # [phone]  Thanks, Barth Kirkseri

## 2016-10-18 NOTE — Telephone Encounter (Signed)
Is this ok?-aa 

## 2016-10-18 NOTE — Telephone Encounter (Signed)
ok 

## 2016-11-24 DIAGNOSIS — Z471 Aftercare following joint replacement surgery: Secondary | ICD-10-CM | POA: Diagnosis not present

## 2016-11-24 DIAGNOSIS — Z96642 Presence of left artificial hip joint: Secondary | ICD-10-CM | POA: Diagnosis not present

## 2017-01-24 ENCOUNTER — Telehealth: Payer: Self-pay | Admitting: Emergency Medicine

## 2017-01-24 NOTE — Telephone Encounter (Signed)
We are out of the flu vaccines at this time and she is not a pt in the office, pt informed and verbalizes understanding.

## 2017-02-24 ENCOUNTER — Other Ambulatory Visit: Payer: Self-pay | Admitting: Family Medicine

## 2017-02-24 NOTE — Telephone Encounter (Signed)
Pt contacted office for refill request on the following medications:  metoprolol succinate (TOPROL-XL) 50 MG 24 hr tablet.  [facility].  CB#[phone]/MW

## 2017-02-27 MED ORDER — METOPROLOL SUCCINATE ER 50 MG PO TB24: ORAL_TABLET | ORAL | 12 refills | Status: DC

## 2017-08-28 ENCOUNTER — Other Ambulatory Visit: Payer: Self-pay | Admitting: Family Medicine

## 2017-11-15 ENCOUNTER — Other Ambulatory Visit: Payer: Self-pay | Admitting: Family Medicine

## 2017-12-20 ENCOUNTER — Other Ambulatory Visit: Payer: Self-pay | Admitting: Family Medicine

## 2018-01-02 ENCOUNTER — Telehealth: Payer: Self-pay | Admitting: Family Medicine

## 2018-01-02 MED ORDER — BENZONATATE 200 MG PO CAPS: 200.0000 mg | ORAL_CAPSULE | Freq: Three times a day (TID) | ORAL | 3 refills | Status: DC | PRN

## 2018-01-02 NOTE — Telephone Encounter (Signed)
Pt is scheduled for OV with Dr. Sullivan LoneGilbert 01/04/18 for a cough that she has had for almost a week. Pt stated that she had tried OTC and nothing has helped. Pt is requesting that Dr. Sullivan LoneGilbert send in an Rx for a cough medication to help pt until her appt on 01/04/18. [facility]. Please advise. Thanks TNP

## 2018-01-02 NOTE — Telephone Encounter (Signed)
Try Benzonate 200mg  TID prn,#30,3rf

## 2018-01-02 NOTE — Telephone Encounter (Signed)
Please advise-Anastasiya V Hopkins, RMA  

## 2018-01-02 NOTE — Telephone Encounter (Signed)
Rx sent to pharmacy. Patient was notified.  

## 2018-01-04 ENCOUNTER — Other Ambulatory Visit: Payer: Self-pay

## 2018-01-04 ENCOUNTER — Ambulatory Visit (INDEPENDENT_AMBULATORY_CARE_PROVIDER_SITE_OTHER): Payer: BLUE CROSS/BLUE SHIELD | Admitting: Family Medicine

## 2018-01-04 VITALS — BP 114/80 | HR 66 | Temp 98.0°F | Resp 16

## 2018-01-04 DIAGNOSIS — J42 Unspecified chronic bronchitis: Secondary | ICD-10-CM

## 2018-01-04 MED ORDER — HYDROCOD POLST-CPM POLST ER 10-8 MG/5ML PO SUER: 5.0000 mL | Freq: Every evening | ORAL | 0 refills | Status: DC | PRN

## 2018-01-04 MED ORDER — AZITHROMYCIN 250 MG PO TABS: ORAL_TABLET | ORAL | 0 refills | Status: DC

## 2018-01-04 NOTE — Progress Notes (Signed)
Ana Ryan  MRN: [id number] DOB: [birth year]  Subjective:  HPI   The patient is a 62 year old female who presents for evaluation of cough and congestion.  She states she was sick in December with flu like symptoms that did not last for very long (she did get her flu vaccine prior).  Since that time she continued to have just a persistent cough.  About 3-4 days ago she started to feel bad and now feels like her chest is congested.  She denies any fever, chills, body aches, SOB or chest pain.  She does not cough anything up but, states she does not try to get it up.    Patient Active Problem List   Diagnosis Date Noted  . Incarcerated incisional hernia 04/13/2016  . Narrowing of intervertebral disc space 07/06/2015  . Spondylosis 07/06/2015  . Ex-cigarette smoker 07/06/2015  . BP (high blood pressure) 07/06/2015  . Adaptive colitis 07/06/2015  . Mild major depression (HCC) 07/06/2015  . Asymptomatic postmenopausal status 07/06/2015  . Overweight 07/06/2015  . Cannot sleep 06/30/2010  . Anxiety 02/09/2010  . Esophagitis, reflux 03/10/2008  . Hypercholesteremia 03/10/2008    Past Medical History:  Diagnosis Date  . GERD (gastroesophageal reflux disease)   . H/O seasonal allergies    uses OTC meds as needed, eyes waters, sneezes  . Hypertension   . Incarcerated incisional hernia 04/13/2016    Social History   Socioeconomic History  . Marital status: Married    Spouse name: Not on file  . Number of children: Not on file  . Years of education: Not on file  . Highest education level: Not on file  Social Needs  . Financial resource strain: Not on file  . Food insecurity - worry: Not on file  . Food insecurity - inability: Not on file  . Transportation needs - medical: Not on file  . Transportation needs - non-medical: Not on file  Occupational History  . Not on file  Tobacco Use  . Smoking status: Former Games developermoker  . Smokeless tobacco: Never Used  . Tobacco comment: did  not smoke regularly  Substance and Sexual Activity  . Alcohol use: Yes    Comment: rare social  . Drug use: No  . Sexual activity: Not on file  Other Topics Concern  . Not on file  Social History Narrative  . Not on file    Outpatient Encounter Medications as of 01/04/2018  Medication Sig  . benzonatate (TESSALON) 200 MG capsule Take 1 capsule (200 mg total) by mouth 3 (three) times daily as needed for cough.  Marland Kitchen. ibuprofen (ADVIL,MOTRIN) 200 MG tablet Take 400 mg by mouth every 6 (six) hours as needed (For pain.).  Marland Kitchen. metoprolol succinate (TOPROL-XL) 50 MG 24 hr tablet TAKE 1 TABLET EACH DAY.  . naphazoline (NAPHCON) 0.1 % ophthalmic solution Place 2 drops into both eyes 4 (four) times daily as needed for irritation or allergies.  Marland Kitchen. NEXIUM 40 MG capsule TAKE 1 CAPSULE EVERY DAY.  . Polyvinyl Alcohol (LIQUID TEARS OP) Apply to eye.   No facility-administered encounter medications on file as of 01/04/2018.     Allergies  Allergen Reactions  . Dilaudid [Hydromorphone Hcl] Other (See Comments)    hallucinations    Review of Systems  Constitutional: Positive for malaise/fatigue. Negative for chills, diaphoresis and fever.  HENT: Positive for congestion (chest). Negative for ear discharge, ear pain, hearing loss, nosebleeds, sinus pain, sore throat and tinnitus.   Eyes: Negative for  blurred vision, double vision, photophobia, pain, discharge and redness.  Respiratory: Positive for cough and wheezing. Negative for sputum production and shortness of breath.   Cardiovascular: Negative for chest pain, palpitations, orthopnea, claudication and leg swelling.  Neurological: Negative for weakness.    Objective:  BP 114/80 (BP Location: Right Arm, Patient Position: Sitting, Cuff Size: Normal)   Pulse 66   Temp 98 F (36.7 C) (Oral)   Resp 16   SpO2 99%   Physical Exam  Constitutional: She is oriented to person, place, and time and well-developed, well-nourished, and in no distress.    HENT:  Head: Normocephalic and atraumatic.  Eyes: Conjunctivae are normal. No scleral icterus.  Neck: No thyromegaly present.  Cardiovascular: Normal rate, regular rhythm and normal heart sounds.  Pulmonary/Chest: Effort normal and breath sounds normal.  Abdominal: Soft.  Neurological: She is alert and oriented to person, place, and time. Gait normal. GCS score is 15.  Skin: Skin is warm and dry.  Psychiatric: Mood, memory, affect and judgment normal.    Assessment and Plan :  1. Chronic bronchitis, unspecified chronic bronchitis type (HCC)  - azithromycin (ZITHROMAX) 250 MG tablet; 2 PO day 1 then 1 PO daily until gone.  Dispense: 6 tablet; Refill: 0 - chlorpheniramine-HYDROcodone (TUSSIONEX PENNKINETIC ER) 10-8 MG/5ML SUER; Take 5 mLs by mouth at bedtime as needed for cough.  Dispense: 140 mL; Refill: 0  I have done the exam and reviewed the chart and it is accurate to the best of my knowledge. Dentist has been used and  any errors in dictation or transcription are unintentional. Julieanne Manson M.D. [facility]

## 2018-01-10 ENCOUNTER — Telehealth: Payer: Self-pay | Admitting: Family Medicine

## 2018-01-10 ENCOUNTER — Other Ambulatory Visit: Payer: Self-pay

## 2018-01-10 MED ORDER — ALBUTEROL SULFATE HFA 108 (90 BASE) MCG/ACT IN AERS: 2.0000 | INHALATION_SPRAY | Freq: Four times a day (QID) | RESPIRATORY_TRACT | 2 refills | Status: DC | PRN

## 2018-01-10 NOTE — Telephone Encounter (Signed)
Try Proventil MDI--1-2 puffs q 4 hours prn,#1,5rf. Can pharmacy instruct her how to use MDI?

## 2018-01-10 NOTE — Telephone Encounter (Signed)
Unable to leave message on voicemail for patient.  Rx sent to pharmacy. Patient needs to be advised of inhaler sent to pharmacy.

## 2018-01-10 NOTE — Telephone Encounter (Signed)
Patient called and stated that she needs something for her coughing during the day. She got something for at night but she is coughing so much during the day that she cannot do function. She said that the pearls do not help and she has tried OTC   [facility] [place]   CB# [phone]

## 2018-01-10 NOTE — Telephone Encounter (Signed)
Patient advised  ED 

## 2018-02-06 ENCOUNTER — Other Ambulatory Visit: Payer: Self-pay | Admitting: Family Medicine

## 2018-03-01 ENCOUNTER — Telehealth: Payer: Self-pay | Admitting: Family Medicine

## 2018-03-01 NOTE — Telephone Encounter (Signed)
Patient is having some "gastric issues" and wanted to talk to BagleyElena regarding this.  She was reluctant to give any other details.

## 2018-03-01 NOTE — Telephone Encounter (Signed)
Patient states she has been having diarrhea with bright red blood.  She does not have the blood with every BM but since yesterday she has had it on and off.  She denies fever, abdominal pain, cramping, nausea or vomiting.  She states she is unable to leave the house because she has no warning of when it will happen.  Immodium has not helped the diarrhea and she would [facility] to help stop or slow the diarrhea and then she would be able to come in for evaluation.   She does note that she has history of IBS.

## 2018-03-02 ENCOUNTER — Other Ambulatory Visit: Payer: Self-pay

## 2018-03-02 DIAGNOSIS — R197 Diarrhea, unspecified: Secondary | ICD-10-CM

## 2018-03-02 MED ORDER — DIPHENOXYLATE-ATROPINE 2.5-0.025 MG PO TABS: 1.0000 | ORAL_TABLET | Freq: Four times a day (QID) | ORAL | 0 refills | Status: DC | PRN

## 2018-03-02 NOTE — Telephone Encounter (Signed)
Can you call in lomotil 1-2 every 6 hours prn,#30. Maybe see Dr B? Or see me next week.

## 2018-05-03 ENCOUNTER — Telehealth: Payer: Self-pay | Admitting: Family Medicine

## 2018-05-03 MED ORDER — CYCLOBENZAPRINE HCL 5 MG PO TABS: 5.0000 mg | ORAL_TABLET | Freq: Three times a day (TID) | ORAL | 1 refills | Status: DC | PRN

## 2018-05-03 NOTE — Telephone Encounter (Signed)
Please advise 

## 2018-05-03 NOTE — Telephone Encounter (Signed)
Flexeril  TID prn,#30,1rf.

## 2018-05-03 NOTE — Telephone Encounter (Signed)
Pt stated that her back has been hurting since Friday 04/27/18 and she was trying to see if it would get better with time. Pt is scheduled to see Dr. Sullivan Lone on Wed 05/09/18 but is requesting if Dr. Sullivan Lone could send in a muscle relaxer or something to [facility] to last until her OV. Pt was offered OV with another provider for today. Pt stated she only wanted to see Dr. Sullivan Lone. Please advise. Thanks TNP

## 2018-05-03 NOTE — Telephone Encounter (Signed)
Advised patient as below. Medication was sent into the pharmacy.  

## 2018-05-09 ENCOUNTER — Encounter: Payer: Self-pay | Admitting: Family Medicine

## 2018-05-09 ENCOUNTER — Ambulatory Visit (INDEPENDENT_AMBULATORY_CARE_PROVIDER_SITE_OTHER): Payer: BLUE CROSS/BLUE SHIELD | Admitting: Family Medicine

## 2018-05-09 VITALS — BP 122/60 | HR 68 | Temp 98.5°F | Resp 16 | Ht 63.0 in

## 2018-05-09 DIAGNOSIS — M549 Dorsalgia, unspecified: Secondary | ICD-10-CM

## 2018-05-09 DIAGNOSIS — R5383 Other fatigue: Secondary | ICD-10-CM

## 2018-05-09 MED ORDER — KETOROLAC TROMETHAMINE 60 MG/2ML IM SOLN
60.0000 mg | Freq: Once | INTRAMUSCULAR | Status: AC
  Administered 2018-05-09: 60 mg via INTRAMUSCULAR

## 2018-05-09 NOTE — Progress Notes (Signed)
Patient: Ana Ryan Female    DOB: 1956/11/10   62 y.o.   MRN: [id number] Visit Date: 05/09/2018  Today's Provider: [name], MD   Chief Complaint  Patient presents with  . Back Pain   Subjective:    HPI Pt is here today for back pain. She reports that it has been going on for about a week and a half. She reports that she [place] the [place] and did not have her inhaler. She coughed so hard that she vomited several times. She laid back down and she got back up her back was hurting. She reports that her back hurts from her mid back down to her lower back. She has also been tired and weak. She is also taking the Flexeril that she was given and has helped her back some. She denies any chest pain, shortness of breath or urinary symptoms.  She does c/o some recent fatigue.     Allergies  Allergen Reactions  . Dilaudid [Hydromorphone Hcl] Other (See Comments)    hallucinations     Current Outpatient Medications:  .  albuterol (PROVENTIL HFA;VENTOLIN HFA) 108 (90 Base) MCG/ACT inhaler, Inhale 2 puffs into the lungs every 6 (six) hours as needed (Wheezing, shortness of breath or cough)., Disp: 1 Inhaler, Rfl: 2 .  aspirin 81 MG chewable tablet, Chew by mouth daily., Disp: , Rfl:  .  cyclobenzaprine (FLEXERIL) 5 MG tablet, Take 1 tablet (5 mg total) by mouth 3 (three) times daily as needed for muscle spasms., Disp: 30 tablet, Rfl: 1 .  ibuprofen (ADVIL,MOTRIN) 200 MG tablet, Take 400 mg by mouth every 6 (six) hours as needed (For pain.)., Disp: , Rfl:  .  metoprolol succinate (TOPROL-XL) 50 MG 24 hr tablet, TAKE 1 TABLET EACH DAY., Disp: 60 tablet, Rfl: 11 .  naphazoline (NAPHCON) 0.1 % ophthalmic solution, Place 2 drops into both eyes 4 (four) times daily as needed for irritation or allergies., Disp: , Rfl:  .  NEXIUM 40 MG capsule, TAKE 1 CAPSULE EVERY DAY., Disp: 90 capsule, Rfl: 3 .  Polyvinyl Alcohol (LIQUID TEARS OP), Apply to eye., Disp: , Rfl:   .  azithromycin (ZITHROMAX) 250 MG tablet, 2 PO day 1 then 1 PO daily until gone. (Patient not taking: Reported on 05/09/2018), Disp: 6 tablet, Rfl: 0 .  benzonatate (TESSALON) 200 MG capsule, Take 1 capsule (200 mg total) by mouth 3 (three) times daily as needed for cough. (Patient not taking: Reported on 05/09/2018), Disp: 30 capsule, Rfl: 3 .  chlorpheniramine-HYDROcodone (TUSSIONEX PENNKINETIC ER) 10-8 MG/5ML SUER, Take 5 mLs by mouth at bedtime as needed for cough. (Patient not taking: Reported on 05/09/2018), Disp: 140 mL, Rfl: 0 .  diphenoxylate-atropine (LOMOTIL) 2.5-0.025 MG tablet, Take 1 tablet by mouth 4 (four) times daily as needed for diarrhea or loose stools. (Patient not taking: Reported on 05/09/2018), Disp: 30 tablet, Rfl: 0  Review of Systems  Constitutional: Positive for fatigue.  Eyes: Negative.   Cardiovascular: Negative.   Gastrointestinal: Negative.   Endocrine: Negative.   Genitourinary: Negative.   Musculoskeletal: Positive for back pain.  Skin: Negative.   Allergic/Immunologic: Negative.   Neurological: Positive for weakness.  Hematological: Negative.   Psychiatric/Behavioral: Negative.     Social History   Tobacco Use  . Smoking status: Former Games developer  . Smokeless tobacco: Never Used  . Tobacco comment: did not smoke regularly  Substance Use Topics  . Alcohol use: Yes  Comment: rare social   Objective:   BP 122/60 (BP Location: Left Arm, Patient Position: Sitting, Cuff Size: Normal)   Pulse 68   Temp 98.5 F (36.9 C) (Oral)   Resp 16   Ht  (1.6 m)   BMI 28.34 kg/m  Vitals:   05/09/18 1620  BP: 122/60  Pulse: 68  Resp: 16  Temp: 98.5 F (36.9 C)  TempSrc: Oral  Height:  (1.6 m)     Physical Exam  Constitutional: She is oriented to person, place, and time. She appears well-developed and well-nourished.  HENT:  Head: Normocephalic and atraumatic.  Eyes: Conjunctivae are normal. No scleral icterus.  Neck: No thyromegaly present.   Cardiovascular: Normal rate, regular rhythm and normal heart sounds.  Pulmonary/Chest: Effort normal and breath sounds normal.  Abdominal: Soft.  Musculoskeletal:  Mild  Pain with flexion of her back.  Neurological: She is alert and oriented to person, place, and time.  Skin: Skin is warm and dry.  Psychiatric: She has a normal mood and affect. Her behavior is normal. Judgment and thought content normal.        Assessment & Plan:     1. Other fatigue  - CBC with Differential/Platelet - TSH - Comprehensive metabolic panel  2. Back pain, unspecified back location, unspecified back pain laterality, unspecified chronicity Appears to be mainly muscular. May need PT referral. Avoid narcotics. - ketorolac (TORADOL) injection 60 mg 3.Chronic Anxiety/Depression Avoid alcohol and narcotics.     I have done the exam and reviewed the above chart and it is accurate to the best of my knowledge. Dentist has been used in this note in any air is in the dictation or transcription are unintentional.  [name], MD  [facility]

## 2018-06-04 DIAGNOSIS — M546 Pain in thoracic spine: Secondary | ICD-10-CM | POA: Diagnosis not present

## 2018-06-04 DIAGNOSIS — M5136 Other intervertebral disc degeneration, lumbar region: Secondary | ICD-10-CM | POA: Diagnosis not present

## 2018-06-11 DIAGNOSIS — M546 Pain in thoracic spine: Secondary | ICD-10-CM | POA: Diagnosis not present

## 2018-06-18 DIAGNOSIS — M546 Pain in thoracic spine: Secondary | ICD-10-CM | POA: Diagnosis not present

## 2018-06-30 DIAGNOSIS — R300 Dysuria: Secondary | ICD-10-CM | POA: Diagnosis not present

## 2018-06-30 DIAGNOSIS — R319 Hematuria, unspecified: Secondary | ICD-10-CM | POA: Diagnosis not present

## 2018-06-30 DIAGNOSIS — N390 Urinary tract infection, site not specified: Secondary | ICD-10-CM | POA: Diagnosis not present

## 2018-06-30 DIAGNOSIS — R350 Frequency of micturition: Secondary | ICD-10-CM | POA: Diagnosis not present

## 2018-06-30 DIAGNOSIS — Z6829 Body mass index (BMI) 29.0-29.9, adult: Secondary | ICD-10-CM | POA: Diagnosis not present

## 2018-07-30 ENCOUNTER — Other Ambulatory Visit: Payer: Self-pay

## 2018-07-30 NOTE — Telephone Encounter (Signed)
Patient states she is having UTI symptoms. [place] She states she is not able to come in for a appointment today because she is waiting on a plumber to come to her house. She is requesting a RX be sent to the pharmacy this morning, and she states she can bring a urine sample to the office this afternoon to be sent for culture. Patient was offered a appointment for 1:20 this afternoon. She states she is unable to come at that time.  CB#[phone].

## 2018-07-30 NOTE — Telephone Encounter (Signed)
[facility] rx [facility].

## 2018-07-30 NOTE — Telephone Encounter (Signed)
Nitrofurantoin 100mg BID for 3 days. 

## 2018-07-30 NOTE — Telephone Encounter (Signed)
Please advise  FYI This is the third time she has called with a problem and said she was unable to be seen.

## 2018-07-30 NOTE — Telephone Encounter (Signed)
She also wants something for the pain with the UTI.  Pt's CB is [phone]  Ana Ryan

## 2018-07-30 NOTE — Telephone Encounter (Signed)
She

## 2018-07-31 MED ORDER — NITROFURANTOIN MONOHYD MACRO 100 MG PO CAPS: 100.0000 mg | ORAL_CAPSULE | Freq: Two times a day (BID) | ORAL | 0 refills | Status: DC

## 2018-07-31 MED ORDER — PHENAZOPYRIDINE HCL 200 MG PO TABS: 200.0000 mg | ORAL_TABLET | Freq: Three times a day (TID) | ORAL | 0 refills | Status: DC | PRN

## 2018-07-31 NOTE — Telephone Encounter (Signed)
Patient called to follow up on requesting antibiotic and analgesic yesterday. Patient reports she has called pharmacy and medications have not been sent. Patient requesting analgesic-Phenazopyridine.

## 2018-07-31 NOTE — Telephone Encounter (Signed)
Pt called back this morning saying nothing has been sent to the pharmacy yet.  She is wanting to know if Dr.Gilbert is going to send something in.  [facility]   Thanks teri

## 2018-07-31 NOTE — Telephone Encounter (Signed)
Please advise if patient can have something for the UTI pain

## 2018-08-06 ENCOUNTER — Encounter: Payer: Self-pay | Admitting: Family Medicine

## 2018-08-06 ENCOUNTER — Ambulatory Visit (INDEPENDENT_AMBULATORY_CARE_PROVIDER_SITE_OTHER): Payer: BLUE CROSS/BLUE SHIELD | Admitting: Family Medicine

## 2018-08-06 VITALS — BP 144/82 | HR 80 | Temp 98.9°F | Resp 16 | Ht 62.0 in

## 2018-08-06 DIAGNOSIS — R319 Hematuria, unspecified: Secondary | ICD-10-CM | POA: Diagnosis not present

## 2018-08-06 DIAGNOSIS — N390 Urinary tract infection, site not specified: Secondary | ICD-10-CM

## 2018-08-06 LAB — POCT URINALYSIS DIPSTICK
BILIRUBIN UA: NEGATIVE
GLUCOSE UA: NEGATIVE
Ketones, UA: NEGATIVE
NITRITE UA: NEGATIVE
Protein, UA: POSITIVE — AB
Spec Grav, UA: 1.02 (ref 1.010–1.025)
Urobilinogen, UA: 0.2 E.U./dL
pH, UA: 6.5 (ref 5.0–8.0)

## 2018-08-06 MED ORDER — SULFAMETHOXAZOLE-TRIMETHOPRIM 400-80 MG PO TABS: 1.0000 | ORAL_TABLET | Freq: Two times a day (BID) | ORAL | 4 refills | Status: DC

## 2018-08-06 NOTE — Patient Instructions (Signed)
Take Cranberry AZO when having symptoms.

## 2018-08-06 NOTE — Progress Notes (Signed)
Patient: Ana Ryan Female    DOB: 1956/03/28   62 y.o.   MRN: [id number] Visit Date: 08/06/2018  Today's Provider: Megan Mansichard Gilbert Jr, MD   Chief Complaint  Patient presents with  . Urinary Tract Infection   Subjective:    HPI Patient comes in today to have her urine rechecked. She was prescribed Nitrofurantion 100mg  BID X 3 days. She has been off of the medication for about 4 days, and reports that she does feel better. However, she still has odor any urine is cloudy. She also mentions that she had a UTI 2-3 weeks prior to being seen here in the office. She was treated with Nitrofurantion as well.     Allergies  Allergen Reactions  . Dilaudid [Hydromorphone Hcl] Other (See Comments)    hallucinations     Current Outpatient Medications:  .  albuterol (PROVENTIL HFA;VENTOLIN HFA) 108 (90 Base) MCG/ACT inhaler, Inhale 2 puffs into the lungs every 6 (six) hours as needed (Wheezing, shortness of breath or cough)., Disp: 1 Inhaler, Rfl: 2 .  aspirin 81 MG chewable tablet, Chew by mouth daily., Disp: , Rfl:  .  cyclobenzaprine (FLEXERIL) 5 MG tablet, Take 1 tablet (5 mg total) by mouth 3 (three) times daily as needed for muscle spasms., Disp: 30 tablet, Rfl: 1 .  ibuprofen (ADVIL,MOTRIN) 200 MG tablet, Take 400 mg by mouth every 6 (six) hours as needed (For pain.)., Disp: , Rfl:  .  azithromycin (ZITHROMAX) 250 MG tablet, 2 PO day 1 then 1 PO daily until gone. (Patient not taking: Reported on 05/09/2018), Disp: 6 tablet, Rfl: 0 .  benzonatate (TESSALON) 200 MG capsule, Take 1 capsule (200 mg total) by mouth 3 (three) times daily as needed for cough. (Patient not taking: Reported on 05/09/2018), Disp: 30 capsule, Rfl: 3 .  chlorpheniramine-HYDROcodone (TUSSIONEX PENNKINETIC ER) 10-8 MG/5ML SUER, Take 5 mLs by mouth at bedtime as needed for cough. (Patient not taking: Reported on 05/09/2018), Disp: 140 mL, Rfl: 0 .  diphenoxylate-atropine (LOMOTIL) 2.5-0.025 MG tablet, Take 1 tablet  by mouth 4 (four) times daily as needed for diarrhea or loose stools. (Patient not taking: Reported on 05/09/2018), Disp: 30 tablet, Rfl: 0 .  metoprolol succinate (TOPROL-XL) 50 MG 24 hr tablet, TAKE 1 TABLET EACH DAY., Disp: 60 tablet, Rfl: 11 .  naphazoline (NAPHCON) 0.1 % ophthalmic solution, Place 2 drops into both eyes 4 (four) times daily as needed for irritation or allergies., Disp: , Rfl:  .  NEXIUM 40 MG capsule, TAKE 1 CAPSULE EVERY DAY., Disp: 90 capsule, Rfl: 3 .  nitrofurantoin, macrocrystal-monohydrate, (MACROBID) 100 MG capsule, Take 1 capsule (100 mg total) by mouth 2 (two) times daily., Disp: 6 capsule, Rfl: 0 .  phenazopyridine (PYRIDIUM) 200 MG tablet, Take 1 tablet (200 mg total) by mouth 3 (three) times daily as needed for pain., Disp: 10 tablet, Rfl: 0 .  Polyvinyl Alcohol (LIQUID TEARS OP), Apply to eye., Disp: , Rfl:   Review of Systems  Constitutional: Negative for activity change, appetite change, chills, diaphoresis, fatigue, fever and unexpected weight change.  Gastrointestinal: Negative for abdominal pain and nausea.  Genitourinary: Positive for urgency. Negative for decreased urine volume, difficulty urinating, dysuria, enuresis, flank pain, frequency, hematuria, pelvic pain, vaginal bleeding, vaginal discharge and vaginal pain.  Neurological: Negative for dizziness, light-headedness and headaches.  Psychiatric/Behavioral: Negative.     Social History   Tobacco Use  . Smoking status: Former Games developermoker  . Smokeless tobacco: Never Used  .  Tobacco comment: did not smoke regularly  Substance Use Topics  . Alcohol use: Yes    Comment: rare social   Objective:   BP (!) 144/82 (BP Location: Right Arm, Patient Position: Sitting, Cuff Size: Normal)   Pulse 80   Temp 98.9 F (37.2 C)   Resp 16   Ht 5\' 2"  (1.575 m)   SpO2 99%   BMI 29.26 kg/m  Vitals:   08/06/18 1458  BP: (!) 144/82  Pulse: 80  Resp: 16  Temp: 98.9 F (37.2 C)  SpO2: 99%  Height: 5\' 2"   (1.575 m)     Physical Exam  Constitutional: She is oriented to person, place, and time. She appears well-developed and well-nourished.  HENT:  Head: Normocephalic and atraumatic.  Right Ear: External ear normal.  Left Ear: External ear normal.  Nose: Nose normal.  Eyes: Conjunctivae are normal. No scleral icterus.  Neck: No thyromegaly present.  Cardiovascular: Normal rate, regular rhythm and normal heart sounds.  Pulmonary/Chest: Effort normal and breath sounds normal.  Abdominal: Soft.  No CVAT.  Musculoskeletal: She exhibits no edema.  Neurological: She is alert and oriented to person, place, and time.  Skin: Skin is warm and dry.  Psychiatric: She has a normal mood and affect. Her behavior is normal. Judgment and thought content normal.        Assessment & Plan:     1. Urinary tract infection with hematuria, site unspecified May need Gyn /Urology referral in future if recurrent. - POCT urinalysis dipstick - Urine Culture - sulfamethoxazole-trimethoprim (BACTRIM) 400-80 MG tablet; Take 1 tablet by mouth 2 (two) times daily.  Dispense: 10 tablet; Refill: 4  I have done the exam and reviewed the chart and it is accurate to the best of my knowledge. DentistDragon  technology has been used and  any errors in dictation or transcription are unintentional. Julieanne Mansonichard Gilbert M.D. [facility]       Megan Mansichard Gilbert Jr, MD  [facility]

## 2018-08-08 LAB — URINE CULTURE

## 2018-08-14 ENCOUNTER — Telehealth: Payer: Self-pay | Admitting: Family Medicine

## 2018-08-14 DIAGNOSIS — Z1211 Encounter for screening for malignant neoplasm of colon: Secondary | ICD-10-CM

## 2018-08-14 NOTE — Telephone Encounter (Signed)
ok 

## 2018-08-14 NOTE — Telephone Encounter (Signed)
Is it okay to refer?  Thanks,   -Brydan Downard  

## 2018-08-14 NOTE — Telephone Encounter (Signed)
Pt is requesting a referral for a screening colonoscopy to be sent to [place]. [place]. Please advise. Thanks TNP

## 2018-08-15 NOTE — Telephone Encounter (Signed)
Referral placed.

## 2018-11-30 ENCOUNTER — Other Ambulatory Visit: Payer: Self-pay | Admitting: Family Medicine

## 2019-01-12 ENCOUNTER — Other Ambulatory Visit: Payer: Self-pay | Admitting: Family Medicine

## 2019-01-19 DIAGNOSIS — N179 Acute kidney failure, unspecified: Secondary | ICD-10-CM

## 2019-01-19 HISTORY — DX: Acute kidney failure, unspecified: N17.9

## 2019-02-01 ENCOUNTER — Inpatient Hospital Stay (HOSPITAL_COMMUNITY)
Admission: EM | Admit: 2019-02-01 | Discharge: 2019-02-04 | DRG: 683 | Disposition: A | Discharge status: Home / Self Care | Payer: BLUE CROSS/BLUE SHIELD | Attending: Internal Medicine | Admitting: Internal Medicine

## 2019-02-01 ENCOUNTER — Encounter (HOSPITAL_COMMUNITY): Payer: Self-pay | Admitting: Emergency Medicine

## 2019-02-01 ENCOUNTER — Other Ambulatory Visit: Payer: Self-pay

## 2019-02-01 ENCOUNTER — Emergency Department (HOSPITAL_COMMUNITY): Payer: BLUE CROSS/BLUE SHIELD

## 2019-02-01 ENCOUNTER — Ambulatory Visit: Payer: BLUE CROSS/BLUE SHIELD | Admitting: Emergency Medicine

## 2019-02-01 ENCOUNTER — Inpatient Hospital Stay (HOSPITAL_COMMUNITY): Payer: BLUE CROSS/BLUE SHIELD

## 2019-02-01 ENCOUNTER — Ambulatory Visit (HOSPITAL_COMMUNITY)
Admission: EM | Admit: 2019-02-01 | Discharge: 2019-02-01 | Disposition: A | Discharge status: Other Acute Inpatient Hospital | Payer: BLUE CROSS/BLUE SHIELD

## 2019-02-01 DIAGNOSIS — R531 Weakness: Secondary | ICD-10-CM | POA: Diagnosis not present

## 2019-02-01 DIAGNOSIS — E7800 Pure hypercholesterolemia, unspecified: Secondary | ICD-10-CM | POA: Diagnosis present

## 2019-02-01 DIAGNOSIS — E871 Hypo-osmolality and hyponatremia: Secondary | ICD-10-CM | POA: Diagnosis present

## 2019-02-01 DIAGNOSIS — D539 Nutritional anemia, unspecified: Secondary | ICD-10-CM | POA: Diagnosis not present

## 2019-02-01 DIAGNOSIS — Z7982 Long term (current) use of aspirin: Secondary | ICD-10-CM

## 2019-02-01 DIAGNOSIS — Z885 Allergy status to narcotic agent status: Secondary | ICD-10-CM | POA: Diagnosis not present

## 2019-02-01 DIAGNOSIS — I959 Hypotension, unspecified: Secondary | ICD-10-CM | POA: Diagnosis not present

## 2019-02-01 DIAGNOSIS — E872 Acidosis, unspecified: Secondary | ICD-10-CM | POA: Diagnosis present

## 2019-02-01 DIAGNOSIS — E876 Hypokalemia: Secondary | ICD-10-CM | POA: Diagnosis present

## 2019-02-01 DIAGNOSIS — K219 Gastro-esophageal reflux disease without esophagitis: Secondary | ICD-10-CM | POA: Diagnosis not present

## 2019-02-01 DIAGNOSIS — Z96641 Presence of right artificial hip joint: Secondary | ICD-10-CM | POA: Diagnosis present

## 2019-02-01 DIAGNOSIS — F1010 Alcohol abuse, uncomplicated: Secondary | ICD-10-CM | POA: Diagnosis not present

## 2019-02-01 DIAGNOSIS — Z87891 Personal history of nicotine dependence: Secondary | ICD-10-CM | POA: Diagnosis not present

## 2019-02-01 DIAGNOSIS — E860 Dehydration: Secondary | ICD-10-CM | POA: Diagnosis not present

## 2019-02-01 DIAGNOSIS — R739 Hyperglycemia, unspecified: Secondary | ICD-10-CM | POA: Diagnosis not present

## 2019-02-01 DIAGNOSIS — I10 Essential (primary) hypertension: Secondary | ICD-10-CM | POA: Diagnosis not present

## 2019-02-01 DIAGNOSIS — Z87440 Personal history of urinary (tract) infections: Secondary | ICD-10-CM

## 2019-02-01 DIAGNOSIS — Z79899 Other long term (current) drug therapy: Secondary | ICD-10-CM | POA: Diagnosis not present

## 2019-02-01 DIAGNOSIS — E785 Hyperlipidemia, unspecified: Secondary | ICD-10-CM | POA: Diagnosis present

## 2019-02-01 DIAGNOSIS — N170 Acute kidney failure with tubular necrosis: Principal | ICD-10-CM | POA: Diagnosis present

## 2019-02-01 DIAGNOSIS — E8729 Other acidosis: Secondary | ICD-10-CM

## 2019-02-01 DIAGNOSIS — N390 Urinary tract infection, site not specified: Secondary | ICD-10-CM | POA: Diagnosis not present

## 2019-02-01 DIAGNOSIS — Z90710 Acquired absence of both cervix and uterus: Secondary | ICD-10-CM

## 2019-02-01 DIAGNOSIS — J302 Other seasonal allergic rhinitis: Secondary | ICD-10-CM | POA: Diagnosis not present

## 2019-02-01 DIAGNOSIS — R0602 Shortness of breath: Secondary | ICD-10-CM | POA: Diagnosis not present

## 2019-02-01 DIAGNOSIS — N179 Acute kidney failure, unspecified: Secondary | ICD-10-CM | POA: Diagnosis present

## 2019-02-01 DIAGNOSIS — Z8349 Family history of other endocrine, nutritional and metabolic diseases: Secondary | ICD-10-CM | POA: Diagnosis not present

## 2019-02-01 DIAGNOSIS — Z791 Long term (current) use of non-steroidal anti-inflammatories (NSAID): Secondary | ICD-10-CM

## 2019-02-01 DIAGNOSIS — D649 Anemia, unspecified: Secondary | ICD-10-CM | POA: Diagnosis not present

## 2019-02-01 DIAGNOSIS — J984 Other disorders of lung: Secondary | ICD-10-CM | POA: Diagnosis not present

## 2019-02-01 DIAGNOSIS — Z820 Family history of epilepsy and other diseases of the nervous system: Secondary | ICD-10-CM

## 2019-02-01 DIAGNOSIS — Z23 Encounter for immunization: Secondary | ICD-10-CM

## 2019-02-01 HISTORY — DX: Acute kidney failure, unspecified: N17.9

## 2019-02-01 LAB — TYPE AND SCREEN
ABO/RH(D): A NEG
Antibody Screen: NEGATIVE

## 2019-02-01 LAB — CBC WITH DIFFERENTIAL/PLATELET
Abs Immature Granulocytes: 0.1 10*3/uL — ABNORMAL HIGH (ref 0.00–0.07)
Basophils Absolute: 0 10*3/uL (ref 0.0–0.1)
Basophils Relative: 0 %
Eosinophils Absolute: 0 10*3/uL (ref 0.0–0.5)
Eosinophils Relative: 0 %
HCT: 35.1 % — ABNORMAL LOW (ref 36.0–46.0)
Hemoglobin: 10.9 g/dL — ABNORMAL LOW (ref 12.0–15.0)
Immature Granulocytes: 1 %
Lymphocytes Relative: 6 %
Lymphs Abs: 0.9 10*3/uL (ref 0.7–4.0)
MCH: 32.2 pg (ref 26.0–34.0)
MCHC: 31.1 g/dL (ref 30.0–36.0)
MCV: 103.5 fL — AB (ref 80.0–100.0)
MONOS PCT: 9 %
Monocytes Absolute: 1.3 10*3/uL — ABNORMAL HIGH (ref 0.1–1.0)
Neutro Abs: 11.8 10*3/uL — ABNORMAL HIGH (ref 1.7–7.7)
Neutrophils Relative %: 84 %
Platelets: 220 10*3/uL (ref 150–400)
RBC: 3.39 MIL/uL — ABNORMAL LOW (ref 3.87–5.11)
RDW: 14.6 % (ref 11.5–15.5)
WBC: 14.1 10*3/uL — ABNORMAL HIGH (ref 4.0–10.5)
nRBC: 0 % (ref 0.0–0.2)

## 2019-02-01 LAB — HEMOGLOBIN A1C
HEMOGLOBIN A1C: 5.5 % (ref 4.8–5.6)
Mean Plasma Glucose: 111.15 mg/dL

## 2019-02-01 LAB — COMPREHENSIVE METABOLIC PANEL
ALT: 13 U/L (ref 0–44)
ANION GAP: 16 — AB (ref 5–15)
AST: 13 U/L — ABNORMAL LOW (ref 15–41)
Albumin: 3 g/dL — ABNORMAL LOW (ref 3.5–5.0)
Alkaline Phosphatase: 76 U/L (ref 38–126)
BUN: 69 mg/dL — ABNORMAL HIGH (ref 8–23)
CALCIUM: 9.6 mg/dL (ref 8.9–10.3)
CO2: 10 mmol/L — ABNORMAL LOW (ref 22–32)
Chloride: 103 mmol/L (ref 98–111)
Creatinine, Ser: 5.32 mg/dL — ABNORMAL HIGH (ref 0.44–1.00)
GFR calc Af Amer: 9 mL/min — ABNORMAL LOW (ref >60)
GFR calc non Af Amer: 8 mL/min — ABNORMAL LOW (ref >60)
Glucose, Bld: 125 mg/dL — ABNORMAL HIGH (ref 70–99)
Potassium: 4 mmol/L (ref 3.5–5.1)
Sodium: 129 mmol/L — ABNORMAL LOW (ref 135–145)
TOTAL PROTEIN: 7 g/dL (ref 6.5–8.1)
Total Bilirubin: 0.3 mg/dL (ref 0.3–1.2)

## 2019-02-01 LAB — RAPID URINE DRUG SCREEN, HOSP PERFORMED
Amphetamines: NOT DETECTED
Barbiturates: NOT DETECTED
Benzodiazepines: NOT DETECTED
Cocaine: NOT DETECTED
Opiates: NOT DETECTED
Tetrahydrocannabinol: NOT DETECTED

## 2019-02-01 LAB — PROTEIN / CREATININE RATIO, URINE
Creatinine, Urine: 69.66 mg/dL
PROTEIN CREATININE RATIO: 1.89 mg/mg{creat} — AB (ref 0.00–0.15)
Total Protein, Urine: 132 mg/dL

## 2019-02-01 LAB — POCT I-STAT EG7
Acid-base deficit: 20 mmol/L — ABNORMAL HIGH (ref 0.0–2.0)
Bicarbonate: 9.4 mmol/L — ABNORMAL LOW (ref 20.0–28.0)
Calcium, Ion: 1.28 mmol/L (ref 1.15–1.40)
HCT: 35 % — ABNORMAL LOW (ref 36.0–46.0)
Hemoglobin: 11.9 g/dL — ABNORMAL LOW (ref 12.0–15.0)
O2 Saturation: 40 %
PH VEN: 7.039 — AB (ref 7.250–7.430)
POTASSIUM: 4 mmol/L (ref 3.5–5.1)
Sodium: 128 mmol/L — ABNORMAL LOW (ref 135–145)
TCO2: 10 mmol/L — ABNORMAL LOW (ref 22–32)
pCO2, Ven: 35 mmHg — ABNORMAL LOW (ref 44.0–60.0)
pO2, Ven: 33 mmHg (ref 32.0–45.0)

## 2019-02-01 LAB — PROTIME-INR
INR: 1.03
Prothrombin Time: 13.4 seconds (ref 11.4–15.2)

## 2019-02-01 LAB — BASIC METABOLIC PANEL
ANION GAP: 15 (ref 5–15)
BUN: 68 mg/dL — ABNORMAL HIGH (ref 8–23)
CHLORIDE: 106 mmol/L (ref 98–111)
CO2: 11 mmol/L — ABNORMAL LOW (ref 22–32)
Calcium: 8.7 mg/dL — ABNORMAL LOW (ref 8.9–10.3)
Creatinine, Ser: 5.2 mg/dL — ABNORMAL HIGH (ref 0.44–1.00)
GFR calc Af Amer: 9 mL/min — ABNORMAL LOW (ref >60)
GFR calc non Af Amer: 8 mL/min — ABNORMAL LOW (ref >60)
Glucose, Bld: 106 mg/dL — ABNORMAL HIGH (ref 70–99)
Potassium: 3.6 mmol/L (ref 3.5–5.1)
Sodium: 132 mmol/L — ABNORMAL LOW (ref 135–145)

## 2019-02-01 LAB — URINALYSIS, ROUTINE W REFLEX MICROSCOPIC
Bilirubin Urine: NEGATIVE
Glucose, UA: NEGATIVE mg/dL
Ketones, ur: NEGATIVE mg/dL
Nitrite: NEGATIVE
PH: 5 (ref 5.0–8.0)
Protein, ur: 30 mg/dL — AB
Specific Gravity, Urine: 1.012 (ref 1.005–1.030)
WBC, UA: >50 WBC/hpf — ABNORMAL HIGH (ref 0–5)

## 2019-02-01 LAB — ETHANOL: Alcohol, Ethyl (B): <10 mg/dL (ref <10)

## 2019-02-01 LAB — NA AND K (SODIUM & POTASSIUM), RAND UR
Potassium Urine: 34 mmol/L
Sodium, Ur: 37 mmol/L

## 2019-02-01 LAB — ABO/RH: ABO/RH(D): A NEG

## 2019-02-01 LAB — LACTIC ACID, PLASMA: Lactic Acid, Venous: 0.5 mmol/L (ref 0.5–1.9)

## 2019-02-01 LAB — VOLATILES,BLD-ACETONE,ETHANOL,ISOPROP,METHANOL
Acetone, blood: NEGATIVE % (ref 0.000–0.010)
Ethanol, blood: NEGATIVE % (ref 0.000–0.010)
Isopropanol, blood: NEGATIVE % (ref 0.000–0.010)
Methanol, blood: NEGATIVE % (ref 0.000–0.010)

## 2019-02-01 LAB — INFLUENZA PANEL BY PCR (TYPE A & B)
INFLBPCR: NEGATIVE
Influenza A By PCR: NEGATIVE

## 2019-02-01 LAB — I-STAT TROPONIN, ED: Troponin i, poc: 0.02 ng/mL (ref 0.00–0.08)

## 2019-02-01 LAB — MRSA PCR SCREENING: MRSA by PCR: NEGATIVE

## 2019-02-01 LAB — ACETAMINOPHEN LEVEL: Acetaminophen (Tylenol), Serum: <10 ug/mL — ABNORMAL LOW (ref 10–30)

## 2019-02-01 LAB — MAGNESIUM: Magnesium: 2.3 mg/dL (ref 1.7–2.4)

## 2019-02-01 LAB — PHOSPHORUS: Phosphorus: 5.5 mg/dL — ABNORMAL HIGH (ref 2.5–4.6)

## 2019-02-01 LAB — SALICYLATE LEVEL: Salicylate Lvl: <7 mg/dL (ref 2.8–30.0)

## 2019-02-01 LAB — D-DIMER, QUANTITATIVE: D-Dimer, Quant: 0.72 ug/mL-FEU — ABNORMAL HIGH (ref 0.00–0.50)

## 2019-02-01 MED ORDER — CAMPHOR-MENTHOL 0.5-0.5 % EX LOTN
1.0000 "application " | TOPICAL_LOTION | Freq: Three times a day (TID) | CUTANEOUS | Status: DC | PRN
  Filled 2019-02-01: qty 222

## 2019-02-01 MED ORDER — SODIUM CHLORIDE 0.9% FLUSH
3.0000 mL | Freq: Two times a day (BID) | INTRAVENOUS | Status: DC
  Administered 2019-02-04: 3 mL via INTRAVENOUS

## 2019-02-01 MED ORDER — SODIUM CHLORIDE 0.9 % IV BOLUS
1000.0000 mL | Freq: Once | INTRAVENOUS | Status: AC
  Administered 2019-02-01: 1000 mL via INTRAVENOUS

## 2019-02-01 MED ORDER — DOCUSATE SODIUM 100 MG PO CAPS
100.0000 mg | ORAL_CAPSULE | Freq: Two times a day (BID) | ORAL | Status: DC
  Administered 2019-02-02: 100 mg via ORAL
  Filled 2019-02-01 (×5): qty 1

## 2019-02-01 MED ORDER — ONDANSETRON HCL 4 MG PO TABS: 4.0000 mg | ORAL_TABLET | Freq: Four times a day (QID) | ORAL | Status: DC | PRN

## 2019-02-01 MED ORDER — PNEUMOCOCCAL VAC POLYVALENT 25 MCG/0.5ML IJ INJ
0.5000 mL | INJECTION | INTRAMUSCULAR | Status: AC
  Administered 2019-02-02: 0.5 mL via INTRAMUSCULAR
  Filled 2019-02-01: qty 0.5

## 2019-02-01 MED ORDER — CEPHALEXIN 250 MG PO CAPS
250.0000 mg | ORAL_CAPSULE | Freq: Two times a day (BID) | ORAL | Status: DC
  Administered 2019-02-01 – 2019-02-04 (×6): 250 mg via ORAL
  Filled 2019-02-01 (×8): qty 1

## 2019-02-01 MED ORDER — SODIUM BICARBONATE 8.4 % IV SOLN: INTRAVENOUS | Status: DC

## 2019-02-01 MED ORDER — ACETAMINOPHEN 650 MG RE SUPP: 650.0000 mg | Freq: Four times a day (QID) | RECTAL | Status: DC | PRN

## 2019-02-01 MED ORDER — CALCIUM CARBONATE ANTACID 1250 MG/5ML PO SUSP
500.0000 mg | Freq: Four times a day (QID) | ORAL | Status: DC | PRN
  Filled 2019-02-01: qty 5

## 2019-02-01 MED ORDER — ACETAMINOPHEN 325 MG PO TABS
650.0000 mg | ORAL_TABLET | Freq: Four times a day (QID) | ORAL | Status: DC | PRN
  Administered 2019-02-03 – 2019-02-04 (×3): 650 mg via ORAL
  Filled 2019-02-01 (×4): qty 2

## 2019-02-01 MED ORDER — ALBUTEROL SULFATE (2.5 MG/3ML) 0.083% IN NEBU: 3.0000 mL | INHALATION_SOLUTION | Freq: Four times a day (QID) | RESPIRATORY_TRACT | Status: DC | PRN

## 2019-02-01 MED ORDER — SODIUM CHLORIDE 0.9% FLUSH: 3.0000 mL | Freq: Once | INTRAVENOUS | Status: DC

## 2019-02-01 MED ORDER — ENOXAPARIN SODIUM 30 MG/0.3ML ~~LOC~~ SOLN
30.0000 mg | SUBCUTANEOUS | Status: DC
  Administered 2019-02-01 – 2019-02-02 (×2): 30 mg via SUBCUTANEOUS
  Filled 2019-02-01 (×3): qty 0.3

## 2019-02-01 MED ORDER — DOCUSATE SODIUM 283 MG RE ENEM
1.0000 | ENEMA | RECTAL | Status: DC | PRN
  Filled 2019-02-01: qty 1

## 2019-02-01 MED ORDER — ZOLPIDEM TARTRATE 5 MG PO TABS: 5.0000 mg | ORAL_TABLET | Freq: Every evening | ORAL | Status: DC | PRN

## 2019-02-01 MED ORDER — HYDROXYZINE HCL 25 MG PO TABS: 25.0000 mg | ORAL_TABLET | Freq: Three times a day (TID) | ORAL | Status: DC | PRN

## 2019-02-01 MED ORDER — NEPRO/CARBSTEADY PO LIQD: 237.0000 mL | Freq: Three times a day (TID) | ORAL | Status: DC | PRN

## 2019-02-01 MED ORDER — INFLUENZA VAC SPLIT QUAD 0.5 ML IM SUSY
0.5000 mL | PREFILLED_SYRINGE | INTRAMUSCULAR | Status: AC
  Administered 2019-02-02: 0.5 mL via INTRAMUSCULAR
  Filled 2019-02-01: qty 0.5

## 2019-02-01 MED ORDER — STERILE WATER FOR INJECTION IV SOLN
INTRAVENOUS | Status: DC
  Administered 2019-02-01 – 2019-02-03 (×5): via INTRAVENOUS
  Filled 2019-02-01 (×7): qty 850

## 2019-02-01 MED ORDER — METOPROLOL SUCCINATE ER 25 MG PO TB24
50.0000 mg | ORAL_TABLET | Freq: Every day | ORAL | Status: DC
  Administered 2019-02-02 – 2019-02-04 (×3): 50 mg via ORAL
  Filled 2019-02-01 (×3): qty 2

## 2019-02-01 MED ORDER — SORBITOL 70 % SOLN
30.0000 mL | Status: DC | PRN
  Filled 2019-02-01: qty 30

## 2019-02-01 MED ORDER — STERILE WATER FOR INJECTION IV SOLN
INTRAVENOUS | Status: DC
  Administered 2019-02-01: 14:00:00 via INTRAVENOUS
  Filled 2019-02-01 (×2): qty 850

## 2019-02-01 MED ORDER — ONDANSETRON HCL 4 MG/2ML IJ SOLN: 4.0000 mg | Freq: Four times a day (QID) | INTRAMUSCULAR | Status: DC | PRN

## 2019-02-01 MED ORDER — SODIUM BICARBONATE 8.4 % IV SOLN
50.0000 meq | Freq: Once | INTRAVENOUS | Status: AC
  Administered 2019-02-01: 50 meq via INTRAVENOUS
  Filled 2019-02-01: qty 50

## 2019-02-01 NOTE — H&P (Signed)
History and Physical    INOCENCIA IXTA MHD:[id number] DOB: [birth year] DOA: 02/01/2019  PCP: Maple Hudson., MD Consultants:  Kinnie Scales - GI; Gioffre/Olin - orthopedics Patient coming from:  Home - lives alone (husband is not living with her); NOK: Husband, [phone]  Chief Complaint: Cough  HPI: Ana Ryan is a 63 y.o. female with medical history significant of HTN presenting with cough.  She hasn't been feeling well, has had a persistent but dry cough.  Some light-headedness.  She fell about 3 days ago.  She hurt her face in the fall.  Symptoms have been going on for about 10 days.  Her daughter saw her last weekend and her symptoms were clearly much better at that time.  She last felt "normal" about 2 weeks ago.  She has had a diminishing appetite in that time and increasing fatigue.  She does eat and drink and has not been doing the keto diet.  In the last 2-3 weeks, she has had very little ETOH.  She does have a h/o ETOH abuse.  She last drank heavily through the holidays but that was less than at some times.  She has had decreasing UOP recently, particularly in the last couple of days.  She has peed twice today since she got up.  She does not take illicit drugs or use any unusual substances.  She has been taking cough syrup 2-3 times a day, usual dosage.    ED Course:  Mentating fine.  Must have subacute renal failure.  Minimal baseline medical problems.  Went to UC and BP 80/57, but normal in ER without IVF.  Creatinine 5.32, CO2 10.  Denies ETOH for a few days, taking cough medicine 2-3 times a day as directed.  Nephrology recommends bicarb drip and they will see her.  She is making urine.  Review of Systems: As per HPI; otherwise review of systems reviewed and negative.   Ambulatory Status:  Ambulates without assistance  Past Medical History:  Diagnosis Date  . GERD (gastroesophageal reflux disease)   . H/O seasonal allergies    uses OTC meds as needed, eyes waters,  sneezes  . Hypertension   . Incarcerated incisional hernia 04/13/2016    Past Surgical History:  Procedure Laterality Date  . ABDOMINAL HYSTERECTOMY    . APPENDECTOMY    . CESAREAN SECTION     x3  . INCISIONAL HERNIA REPAIR N/A 04/13/2016   Procedure: LAPAROSCOPIC INCISIONAL HERNIA WITH MESH ;  Surgeon: Claud Kelp, MD;  Location: [facility];  Service: General;  Laterality: N/A;  . INSERTION OF MESH N/A 04/13/2016   Procedure: INSERTION OF MES,lysis of adhesions;  Surgeon: Claud Kelp, MD;  Location: [facility];  Service: General;  Laterality: N/A;  . TOE SURGERY Right   . TOTAL HIP ARTHROPLASTY Right    1'4481; 2016  . WISDOM TOOTH EXTRACTION      Social History   Socioeconomic History  . Marital status: Married    Spouse name: Not on file  . Number of children: Not on file  . Years of education: Not on file  . Highest education level: Not on file  Occupational History  . Not on file  Social Needs  . Financial resource strain: Not on file  . Food insecurity:    Worry: Not on file    Inability: Not on file  . Transportation needs:    Medical: Not on file    Non-medical: Not on file  Tobacco Use  .  Smoking status: Former Games developermoker  . Smokeless tobacco: Never Used  . Tobacco comment: did not smoke regularly  Substance and Sexual Activity  . Alcohol use: Yes    Comment: h/o heavy use  . Drug use: No  . Sexual activity: Not on file  Lifestyle  . Physical activity:    Days per week: Not on file    Minutes per session: Not on file  . Stress: Not on file  Relationships  . Social connections:    Talks on phone: Not on file    Gets together: Not on file    Attends religious service: Not on file    Active member of club or organization: Not on file    Attends meetings of clubs or organizations: Not on file    Relationship status: Not on file  . Intimate partner violence:    Fear of current or ex partner: Not on file    Emotionally abused: Not on file    Physically  abused: Not on file    Forced sexual activity: Not on file  Other Topics Concern  . Not on file  Social History Narrative  . Not on file    Allergies  Allergen Reactions  . Dilaudid [Hydromorphone Hcl] Other (See Comments)    hallucinations    Family History  Problem Relation Age of Onset  . Hyperlipidemia Mother   . Osteoarthritis Father   . Parkinson's disease Father   . Thyroid disease Son   . Kidney disease Neg Hx     Prior to Admission medications   Medication Sig Start Date End Date Taking? Authorizing Provider  metoprolol succinate (TOPROL-XL) 50 MG 24 hr tablet TAKE 1 TABLET EACH DAY. Patient taking differently: Take 50 mg by mouth daily.  01/13/19  Yes Maple HudsonGilbert, Richard L Jr., MD  NEXIUM 40 MG capsule TAKE 1 CAPSULE EVERY DAY. Patient taking differently: Take 40 mg by mouth daily.  12/01/18  Yes Maple HudsonGilbert, Richard L Jr., MD  albuterol (PROVENTIL HFA;VENTOLIN HFA) 108 [phone](90 Base) MCG/ACT inhaler Inhale 2 puffs into the lungs every 6 (six) hours as needed (Wheezing, shortness of breath or cough). 01/10/18   Maple HudsonGilbert, Richard L Jr., MD  aspirin 81 MG chewable tablet Chew by mouth daily.    [provider]  benzonatate (TESSALON) 200 MG capsule Take 1 capsule (200 mg total) by mouth 3 (three) times daily as needed for cough. Patient not taking: Reported on 05/09/2018 01/02/18   Maple HudsonGilbert, Richard L Jr., MD  chlorpheniramine-HYDROcodone [facility](TUSSIONEX PENNKINETIC ER) 10-8 MG/5ML SUER Take 5 mLs by mouth at bedtime as needed for cough. Patient not taking: Reported on 05/09/2018 01/04/18   Maple HudsonGilbert, Richard L Jr., MD  cyclobenzaprine (FLEXERIL) 5 MG tablet Take 1 tablet (5 mg total) by mouth 3 (three) times daily as needed for muscle spasms. 05/03/18   Maple HudsonGilbert, Richard L Jr., MD  diphenoxylate-atropine (LOMOTIL) 2.5-0.025 MG tablet Take 1 tablet by mouth 4 (four) times daily as needed for diarrhea or loose stools. Patient not taking: Reported on 05/09/2018 03/02/18   Maple HudsonGilbert, Richard L Jr.,  MD  ibuprofen (ADVIL,MOTRIN) 200 MG tablet Take 400 mg by mouth every 6 (six) hours as needed (For pain.).    [provider]  naphazoline (NAPHCON) 0.1 % ophthalmic solution Place 2 drops into both eyes 4 (four) times daily as needed for irritation or allergies.    [provider]  phenazopyridine (PYRIDIUM) 200 MG tablet Take 1 tablet (200 mg total) by mouth 3 (three) times daily  as needed for pain. 07/31/18   Maple Hudson., MD  Polyvinyl Alcohol (LIQUID TEARS OP) Apply to eye.    [provider]    Physical Exam: Vitals:   02/01/19 1315 02/01/19 1415 02/01/19 1445 02/01/19 1530  BP: 125/76 135/89 (!) 153/99   Pulse: 70 72  80  Resp: 16 19 17 18   Temp:      TempSrc:      SpO2: 100% 100%  99%  Weight:      Height:         . General:  Appears calm and comfortable and is NAD . Eyes:  PERRL, EOMI, normal lids, iris . ENT:  grossly normal hearing, lips & tongue, mmm . Neck:  no LAD, masses or thyromegaly . Cardiovascular:  RRR, no m/r/g. No LE edema.  Marland Kitchen Respiratory:   CTA bilaterally with no wheezes/rales/rhonchi.  Normal respiratory effort. . Abdomen:  soft, NT, ND, NABS . Back:   normal alignment, no CVAT . Skin:  no rash or induration seen on limited exam . Musculoskeletal:  grossly normal tone BUE/BLE, good ROM, no bony abnormality . Psychiatric:  grossly normal mood and affect, speech fluent and appropriate, AOx3 . Neurologic:  CN 2-12 grossly intact, moves all extremities in coordinated fashion, sensation intact    Radiological Exams on Admission: Dg Chest 2 View  Result Date: 02/01/2019 CLINICAL DATA:  Cough.  Hypotension EXAM: CHEST - 2 VIEW COMPARISON:  None. FINDINGS: There is mild scarring in the anterior right base. There is no edema or consolidation. Heart size and pulmonary vascularity are normal. No adenopathy. No bone lesions. IMPRESSION: Scarring anterior right base. No edema or consolidation. Heart size within normal limits.  Electronically Signed   By: Bretta Bang III M.D.   On: 02/01/2019 11:16   US Renal  Result Date: 02/01/2019 CLINICAL DATA:  Acute renal injury EXAM: RENAL / URINARY TRACT ULTRASOUND COMPLETE COMPARISON:  CT 03/16/2016 FINDINGS: Right Kidney: Renal measurements: 8.8 x 4.5 x 5.3 cm = volume: 109 mL . Echogenicity within normal limits. No mass or hydronephrosis visualized. Left Kidney: Renal measurements: 10.3 x 5.9 x 7.2 = volume: 230 mL. Echogenicity within normal limits. No mass or hydronephrosis visualized. Bladder: Appears normal for degree of bladder distention. IMPRESSION: No sonographic findings to explain the patient's acute renal injury. Electronically Signed   By: Tollie Eth M.D.   On: 02/01/2019 15:20    EKG: Independently reviewed.  NSR with rate 66; nonspecific ST changes with no evidence of acute ischemia   Labs on Admission: I have personally reviewed the available labs and imaging studies at the time of the admission.  Pertinent labs:   Na++ 129 CO2 10 Glucose 125 BUN 69/Creatinine 5.32/GFR 8; normal at last check in 2017 Anion gap 16 Albumin 3.0 VBG: 7.039/35.0/9.4 Troponin 0.02 WBC 14.1 Hgb 10.9 D-dimer 0.72 INR 1.03 UA: moderate Hgb; 30 protein; few bacteria, >50 WBC  Assessment/Plan Principal Problem:   Metabolic acidosis Active Problems:   Hypercholesteremia   Acute renal failure (ARF) (HCC)   Hyperglycemia   Alcohol abuse   Metabolic acidosis -Patient without known kidney disease presenting with several weeks of malaise and cough -She was found to have a profound non-anion-gap metabolic acidosis of uncertain etiology, perhaps simply related to renal failure (see below) -Thorough toxicology discussion with the patient did not provide insights -Will admit given the severity of her condition and the potential for clinical deterioration -Nephrology has been consulted and started a bicarb drip -She  will have BID labs to follow -If refractory acidosis,  she may require dialysis  Acute renal failure -Likely due to prerenal failure secondary to dehydration and minimal PO intake with persistent ETOH use and NSAIDs use. -IVF as above with bicarb -Check FeNa -US-renal -Follow up renal function by BMP -Avoid ACEI and NSAIDs -She was hypotensive upon arrival but this has resolved and her BP is now mildly elevated -She did not receive cortisol, although this could be considered if BP falls again  Hyperglycemia -May be stress response -Will follow with fasting AM labs and check A1c -It is unlikely that she will need acute or chronic treatment for this issue  ETOH abuse -Patient denies heavy recent use and reports no use in 1 week and minimal use in 2 weeks -Assuming this is accurate, she should not be at risk for DTs and CIWA protocol should not be necessary  HLD -She is not taking medication for this condition -LDL in 9/14 was 112 -Should be repeated as an outpatient with PCP    DVT prophylaxis: Lovenox  Code Status:  Full - confirmed with patient/family Family Communication: Daughter was present throughout evaluation  Disposition Plan:  Home once clinically improved Consults called: Nephrology  Admission status: Admit - It is my clinical opinion that admission to INPATIENT is reasonable and necessary because of the expectation that this patient will require hospital care that crosses at least 2 midnights to treat this condition based on the medical complexity of the problems presented.  Given the aforementioned information, the predictability of an adverse outcome is felt to be significant.    Jonah Blue MD [facility]   How to contact the [facility] Attending or Consulting provider 7A - 7P or covering provider during after hours 7P -7A, for this patient?  1. Check the care team in [facility] and look for a) attending/consulting [facility] provider listed and b) the [facility] team listed 2. Log into [email or web address] and use [place]'s universal  password to access. If you do not have the password, please contact the hospital operator. 3. Locate the [facility] provider you are looking for under [facility] and page to a number that you can be directly reached. 4. If you still have difficulty reaching the provider, please page the [facility] (Director on Call) for the Hospitalists listed on amion for assistance.   02/01/2019, 4:08 PM

## 2019-02-01 NOTE — Consult Note (Signed)
[facility] Renal Consultation Note  Requesting MD:  Dr. Lockie Mola Indication for Consultation:  Acidosis, AKI  HPI: Ana Ryan is a 63 y.o. female with GERD, HTN, h/o opiod misuse in past who presents with hypotension and is seen in consult for eval and mgmt for AKI and acidosis.   History obtained from she and daughter at bedside.  Normal state of health (per daughter "sharp", independent) until 3 weeks ago developed dry hacking cough without assoc symptoms.  No fevers, chills, sinus symptoms, worse GERD, GI symptoms (has IBS but no change). No hemoptysis, rashes.  She notes decreased po intake but no weight loss, light sweats.  She had urinary urgency without dysuria yesterday but prior no urinary symptoms.  Over weekend fell (says mechanical fall) and cut lip, no LOC.  No CP or palpitations.  No edema, hiccups, dysgeusia, pruritis.  +daily multiple doses ibuprofen for years.   Daughter concerned over weekend on phone calls mom didn't sound like herself, went to house which was 'dirty' which is unusual.  She was concerned about relapse opioids.  Went back today and no improvement, slurred speech so insisted urgent care.  There BP 80/50 and she was directed to ED.  She denies recent drugs or EtOH (Clearly a contentious point between her and daughter).   Here BPs initially 80/60s but now 110-120s.  Labs showed Cr > 5, pH 7.03, UA dirty, details below.  Hospitalist to admit, nephrology c/s.   PMHx:   Past Medical History:  Diagnosis Date  . GERD (gastroesophageal reflux disease)   . H/O seasonal allergies    uses OTC meds as needed, eyes waters, sneezes  . Hypertension   . Incarcerated incisional hernia 04/13/2016    Past Surgical History:  Procedure Laterality Date  . ABDOMINAL HYSTERECTOMY    . APPENDECTOMY    . CESAREAN SECTION     x3  . INCISIONAL HERNIA REPAIR N/A 04/13/2016   Procedure: LAPAROSCOPIC INCISIONAL HERNIA WITH MESH ;  Surgeon: Claud Kelp, MD;   Location: [facility];  Service: General;  Laterality: N/A;  . INSERTION OF MESH N/A 04/13/2016   Procedure: INSERTION OF MES,lysis of adhesions;  Surgeon: Claud Kelp, MD;  Location: [facility];  Service: General;  Laterality: N/A;  . TOE SURGERY Right   . TOTAL HIP ARTHROPLASTY Right    9'2011  . WISDOM TOOTH EXTRACTION      Family Hx:  Family History  Problem Relation Age of Onset  . Hyperlipidemia Mother   . Osteoarthritis Father   . Parkinson's disease Father   . Thyroid disease Son     Social History:  reports that she has quit smoking. She has never used smokeless tobacco. She reports current alcohol use. She reports that she does not use drugs.  Allergies:  Allergies  Allergen Reactions  . Dilaudid [Hydromorphone Hcl] Other (See Comments)    hallucinations    Medications: Prior to Admission medications   Medication Sig Start Date End Date Taking? Authorizing Provider  albuterol (PROVENTIL HFA;VENTOLIN HFA) 108 (90 Base) MCG/ACT inhaler Inhale 2 puffs into the lungs every 6 (six) hours as needed (Wheezing, shortness of breath or cough). 01/10/18  Yes Maple Hudson., MD  aspirin 81 MG chewable tablet Chew 81 mg by mouth daily as needed for mild pain.    Yes [provider]  ibuprofen (ADVIL,MOTRIN) 200 MG tablet Take 400 mg by mouth every 6 (six) hours as needed (For pain.).   Yes [provider]  metoprolol succinate (TOPROL-XL) 50 MG 24 hr tablet TAKE 1 TABLET EACH DAY. Patient taking differently: Take 50 mg by mouth daily.  01/13/19  Yes Maple Hudson., MD  NEXIUM 40 MG capsule TAKE 1 CAPSULE EVERY DAY. Patient taking differently: Take 40 mg by mouth daily.  12/01/18  Yes Maple Hudson., MD  benzonatate (TESSALON) 200 MG capsule Take 1 capsule (200 mg total) by mouth 3 (three) times daily as needed for cough. Patient not taking: Reported on 05/09/2018 01/02/18   Maple Hudson., MD  chlorpheniramine-HYDROcodone [facility]) 10-8 MG/5ML SUER Take 5 mLs by mouth at bedtime as needed for cough. Patient not taking: Reported on 05/09/2018 01/04/18   Maple Hudson., MD  cyclobenzaprine (FLEXERIL) 5 MG tablet Take 1 tablet (5 mg total) by mouth 3 (three) times daily as needed for muscle spasms. Patient not taking: Reported on 02/01/2019 05/03/18   Maple Hudson., MD  diphenoxylate-atropine (LOMOTIL) 2.5-0.025 MG tablet Take 1 tablet by mouth 4 (four) times daily as needed for diarrhea or loose stools. Patient not taking: Reported on 05/09/2018 03/02/18   Maple Hudson., MD  phenazopyridine (PYRIDIUM) 200 MG tablet Take 1 tablet (200 mg total) by mouth 3 (three) times daily as needed for pain. Patient not taking: Reported on 02/01/2019 07/31/18   Maple Hudson., MD    I have reviewed the patient's current medications.  Labs:  Results for orders placed or performed during the hospital encounter of 02/01/19 (from the past 48 hour(s))  Urinalysis, Routine w reflex microscopic     Status: Abnormal   Collection Time: 02/01/19 10:28 AM  Result Value Ref Range   Color, Urine YELLOW YELLOW   APPearance TURBID (A) CLEAR   Specific Gravity, Urine 1.012 1.005 - 1.030   pH 5.0 5.0 - 8.0   Glucose, UA NEGATIVE NEGATIVE mg/dL   Hgb urine dipstick MODERATE (A) NEGATIVE   Bilirubin Urine NEGATIVE NEGATIVE   Ketones, ur NEGATIVE NEGATIVE mg/dL   Protein, ur 30 (A) NEGATIVE mg/dL   Nitrite NEGATIVE NEGATIVE   Leukocytes,Ua LARGE (A) NEGATIVE   RBC / HPF 0-5 0 - 5 RBC/hpf   WBC, UA >50 (H) 0 - 5 WBC/hpf   Bacteria, UA FEW (A) NONE SEEN   Squamous Epithelial / LPF 0-5 0 - 5   WBC Clumps PRESENT    Mucus PRESENT     Comment: Performed at [facility], [address], [place], [place] [address]  Comprehensive metabolic panel     Status: Abnormal   Collection Time: 02/01/19 10:35 AM  Result Value Ref Range   Sodium 129 (L) 135 - 145 mmol/L   Potassium 4.0 3.5 - 5.1 mmol/L   Chloride  103 98 - 111 mmol/L   CO2 10 (L) 22 - 32 mmol/L   Glucose, Bld 125 (H) 70 - 99 mg/dL   BUN 69 (H) 8 - 23 mg/dL   Creatinine, Ser 6.27 (H) 0.44 - 1.00 mg/dL   Calcium 9.6 8.9 - 03.5 mg/dL   Total Protein 7.0 6.5 - 8.1 g/dL   Albumin 3.0 (L) 3.5 - 5.0 g/dL   AST 13 (L) 15 - 41 U/L   ALT 13 0 - 44 U/L   Alkaline Phosphatase 76 38 - 126 U/L   Total Bilirubin 0.3 0.3 - 1.2 mg/dL   GFR calc non Af Amer 8 (L) >60 mL/min   GFR calc Af Amer 9 (L) >60  mL/min   Anion gap 16 (H) 5 - 15    Comment: Performed at [facility], [address] [place], [place] [address]  Protime-INR     Status: None   Collection Time: 02/01/19 10:35 AM  Result Value Ref Range   Prothrombin Time 13.4 11.4 - 15.2 seconds   INR 1.03     Comment: Performed at [facility], [address] [place], [place] [address]  Type and screen [facility]     Status: None   Collection Time: 02/01/19 10:35 AM  Result Value Ref Range   ABO/RH(D) A NEG    Antibody Screen NEG    Sample Expiration      02/04/2019 Performed at [facility], [address] [place], [place] [address]   ABO/Rh     Status: None (Preliminary result)   Collection Time: 02/01/19 10:35 AM  Result Value Ref Range   ABO/RH(D)      A NEG Performed at [facility], [address] [place], [place] [address]   Lactic acid, plasma     Status: None   Collection Time: 02/01/19 10:40 AM  Result Value Ref Range   Lactic Acid, Venous 0.5 0.5 - 1.9 mmol/L    Comment: Performed at [facility], [address] [place], [place] [address]  D-dimer, quantitative (not at [facility])     Status: Abnormal   Collection Time: 02/01/19 10:50 AM  Result Value Ref Range   D-Dimer, Quant 0.72 (H) 0.00 - 0.50 ug/mL-FEU    Comment: (NOTE) At the manufacturer cut-off of 0.50 ug/mL FEU, this assay has been documented to exclude PE with a sensitivity and negative predictive value of 97 to 99%.  At this time, this assay has not been  approved by the FDA to exclude DVT/VTE. Results should be correlated with clinical presentation. Performed at [facility], [address] [place], [place] [address]   I-Stat Troponin, ED (not at [facility])     Status: None   Collection Time: 02/01/19 11:29 AM  Result Value Ref Range   Troponin i, poc 0.02 0.00 - 0.08 ng/mL   Comment 3            Comment: Due to the release kinetics of cTnI, a negative result within the first hours of the onset of symptoms does not rule out myocardial infarction with certainty. If myocardial infarction is still suspected, repeat the test at appropriate intervals.   CBC with Differential     Status: Abnormal   Collection Time: 02/01/19 11:39 AM  Result Value Ref Range   WBC 14.1 (H) 4.0 - 10.5 K/uL   RBC 3.39 (L) 3.87 - 5.11 MIL/uL   Hemoglobin 10.9 (L) 12.0 - 15.0 g/dL   HCT 57.835.1 (L) 46.936.0 - 62.946.0 %   MCV 103.5 (H) 80.0 - 100.0 fL   MCH 32.2 26.0 - 34.0 pg   MCHC 31.1 30.0 - 36.0 g/dL   RDW 52.814.6 41.311.5 - 24.415.5 %   Platelets 220 150 - 400 K/uL   nRBC 0.0 0.0 - 0.2 %   Neutrophils Relative % 84 %   Neutro Abs 11.8 (H) 1.7 - 7.7 K/uL   Lymphocytes Relative 6 %   Lymphs Abs 0.9 0.7 - 4.0 K/uL   Monocytes Relative 9 %   Monocytes Absolute 1.3 (H) 0.1 - 1.0 K/uL   Eosinophils Relative 0 %   Eosinophils Absolute 0.0 0.0 - 0.5 K/uL  Basophils Relative 0 %   Basophils Absolute 0.0 0.0 - 0.1 K/uL   Immature Granulocytes 1 %   Abs Immature Granulocytes 0.10 (H) 0.00 - 0.07 K/uL    Comment: Performed at [facility], [address] [place], [place] [address]  POCT I-Stat EG7     Status: Abnormal   Collection Time: 02/01/19 12:25 PM  Result Value Ref Range   pH, Ven 7.039 (LL) 7.250 - 7.430   pCO2, Ven 35.0 (L) 44.0 - 60.0 mmHg   pO2, Ven 33.0 32.0 - 45.0 mmHg   Bicarbonate 9.4 (L) 20.0 - 28.0 mmol/L   TCO2 10 (L) 22 - 32 mmol/L   O2 Saturation 40.0 %   Acid-base deficit 20.0 (H) 0.0 - 2.0 mmol/L   Sodium 128 (L) 135 - 145 mmol/L    Potassium 4.0 3.5 - 5.1 mmol/L   Calcium, Ion 1.28 1.15 - 1.40 mmol/L   HCT 35.0 (L) 36.0 - 46.0 %   Hemoglobin 11.9 (L) 12.0 - 15.0 g/dL   Patient temperature HIDE    Sample type VENOUS    Comment NOTIFIED PHYSICIAN      ROS:  Pertinent items are noted in HPI.  Physical Exam: Vitals:   02/01/19 1130 02/01/19 1139  BP: 111/73   Pulse: 63   Resp: 12   Temp:  99.4 F (37.4 C)  SpO2: 100%      General: a bit lethargic on stretcher, NAD HEENT: MM tacky, fine dentition, no oral ulcers Eyes: anciteric Neck: supple, no JVD Heart: RRR, no rub or S4 Lungs: normal WOB, clear to bases Abdomen: soft, nontender GU: bladder not palpable, no foley Extremities: no edema, normal muscle bulk Skin: no rashes noted Neuro: a bit drowsy and seems unconcerned with clinical status  Assessment/Plan:  **AKI:  No history of renal disease and last known Cr 0.6 in 2017.  Cr 5.32 in setting of what seems to be weeks of poor po intake + NSAIDs + hypotension.  Suspect this is prerenal either hypovolemia or progressed to ATN.  Unclear timeline but it seems she was well until about 3 weeks ago.  Hopefully it will turn around quickly with BP and volume support but if ATN may not.  UA c/w UTI (h/o UTIs), other than cough no systemic symptoms that make me think GN likely.  I suppose she could have AIN from NSAIDs but seems less likely.  - Check urine sodium and creatinine - BMP 6pm tonight and 5am tomorrow  - Renal US - Strict I/Os, daily weights  **Acidosis, non AG metabolic + resp acidosis:  Possibly related to AKI.  Would not be able to diagnose RTA in setting of AKI.  Has IBS but denies recent diarrhea issues --Bicarb push followed by bicarb gtt at 12525mL/hr --Trend  **Hyponatremia, mild: presumably hypovolemic.  Monitor with hydration.  Further w/u if not improving.  Cortisol given hypotension.  **Anemia, macrocytic: per primary.   **UTI: culture, keflex empirically.  Has h/o UTIs.   Ana Ryan 02/01/2019, 12:55 PM

## 2019-02-01 NOTE — ED Triage Notes (Signed)
Pt sts cough x 3 weeks and URI sx; pt sts generalized weakness and fall x 1; pt noted to be hypotensive; per KL to go to ED for further eval

## 2019-02-01 NOTE — ED Notes (Signed)
Ana Ryan- [phone]  Pts son wants to be contacted if pt goes upstairs

## 2019-02-01 NOTE — ED Triage Notes (Signed)
Pt here with c/o low b/p and a syncopal episode on Tuesday , pt has a a cough for about 3 weeks

## 2019-02-01 NOTE — ED Provider Notes (Signed)
[facility] EMERGENCY DEPARTMENT Provider Note   CSN: [id number] Arrival date & time: 02/01/19  1017     History   Chief Complaint Chief Complaint  Patient presents with  . Hypotension    HPI Ana Ryan is a 63 y.o. female.  The history is provided by the patient.  Weakness  Severity:  Moderate Onset quality:  Gradual Timing:  Constant Progression:  Worsening Chronicity:  New Context: drug use (OTC cough medicine )   Context: not alcohol use, not decreased sleep, not recent infection and not stress   Relieved by:  Nothing Worsened by:  Nothing Ineffective treatments:  None tried Associated symptoms: cough, lethargy, myalgias, nausea and shortness of breath   Associated symptoms: no abdominal pain, no arthralgias, no chest pain, no dizziness, no drooling, no dysuria, no numbness in extremities, no falls, no fever, no foul-smelling urine, no frequency, no headaches, no seizures, no syncope, no urgency, no vision change and no vomiting   Risk factors: no anemia, no coronary artery disease and no heart disease     Past Medical History:  Diagnosis Date  . GERD (gastroesophageal reflux disease)   . H/O seasonal allergies    uses OTC meds as needed, eyes waters, sneezes  . Hypertension   . Incarcerated incisional hernia 04/13/2016    Patient Active Problem List   Diagnosis Date Noted  . Incarcerated incisional hernia 04/13/2016  . Narrowing of intervertebral disc space 07/06/2015  . Spondylosis 07/06/2015  . Ex-cigarette smoker 07/06/2015  . BP (high blood pressure) 07/06/2015  . Adaptive colitis 07/06/2015  . Mild major depression (HCC) 07/06/2015  . Asymptomatic postmenopausal status 07/06/2015  . Overweight 07/06/2015  . Cannot sleep 06/30/2010  . Anxiety 02/09/2010  . Esophagitis, reflux 03/10/2008  . Hypercholesteremia 03/10/2008    Past Surgical History:  Procedure Laterality Date  . ABDOMINAL HYSTERECTOMY    . APPENDECTOMY    .  CESAREAN SECTION     x3  . INCISIONAL HERNIA REPAIR N/A 04/13/2016   Procedure: LAPAROSCOPIC INCISIONAL HERNIA WITH MESH ;  Surgeon: Claud Kelp, MD;  Location: [facility];  Service: General;  Laterality: N/A;  . INSERTION OF MESH N/A 04/13/2016   Procedure: INSERTION OF MES,lysis of adhesions;  Surgeon: Claud Kelp, MD;  Location: [facility];  Service: General;  Laterality: N/A;  . TOE SURGERY Right   . TOTAL HIP ARTHROPLASTY Right    9'2011  . WISDOM TOOTH EXTRACTION       OB History   No obstetric history on file.      Home Medications    Prior to Admission medications   Medication Sig Start Date End Date Taking? Authorizing Provider  albuterol (PROVENTIL HFA;VENTOLIN HFA) 108 (90 Base) MCG/ACT inhaler Inhale 2 puffs into the lungs every 6 (six) hours as needed (Wheezing, shortness of breath or cough). 01/10/18  Yes Maple Hudson., MD  aspirin 81 MG chewable tablet Chew 81 mg by mouth daily as needed for mild pain.    Yes [provider]  ibuprofen (ADVIL,MOTRIN) 200 MG tablet Take 400 mg by mouth every 6 (six) hours as needed (For pain.).   Yes [provider]  metoprolol succinate (TOPROL-XL) 50 MG 24 hr tablet TAKE 1 TABLET EACH DAY. Patient taking differently: Take 50 mg by mouth daily.  01/13/19  Yes Maple Hudson., MD  NEXIUM 40 MG capsule TAKE 1 CAPSULE EVERY DAY. Patient taking differently: Take 40 mg by mouth daily.  12/01/18  Yes  Maple Hudson., MD  benzonatate (TESSALON) 200 MG capsule Take 1 capsule (200 mg total) by mouth 3 (three) times daily as needed for cough. Patient not taking: Reported on 05/09/2018 01/02/18   Maple Hudson., MD  chlorpheniramine-HYDROcodone [facility]) 10-8 MG/5ML SUER Take 5 mLs by mouth at bedtime as needed for cough. Patient not taking: Reported on 05/09/2018 01/04/18   Maple Hudson., MD  cyclobenzaprine (FLEXERIL) 5 MG tablet Take 1 tablet (5 mg total) by mouth 3 (three) times  daily as needed for muscle spasms. Patient not taking: Reported on 02/01/2019 05/03/18   Maple Hudson., MD  diphenoxylate-atropine (LOMOTIL) 2.5-0.025 MG tablet Take 1 tablet by mouth 4 (four) times daily as needed for diarrhea or loose stools. Patient not taking: Reported on 05/09/2018 03/02/18   Maple Hudson., MD  phenazopyridine (PYRIDIUM) 200 MG tablet Take 1 tablet (200 mg total) by mouth 3 (three) times daily as needed for pain. Patient not taking: Reported on 02/01/2019 07/31/18   Maple Hudson., MD    Family History Family History  Problem Relation Age of Onset  . Hyperlipidemia Mother   . Osteoarthritis Father   . Parkinson's disease Father   . Thyroid disease Son     Social History Social History   Tobacco Use  . Smoking status: Former Games developer  . Smokeless tobacco: Never Used  . Tobacco comment: did not smoke regularly  Substance Use Topics  . Alcohol use: Yes    Comment: rare social  . Drug use: No     Allergies   Dilaudid [hydromorphone hcl]   Review of Systems Review of Systems  Constitutional: Negative for chills and fever.  HENT: Negative for drooling, ear pain and sore throat.   Eyes: Negative for pain and visual disturbance.  Respiratory: Positive for cough and shortness of breath.   Cardiovascular: Negative for chest pain, palpitations and syncope.  Gastrointestinal: Positive for nausea. Negative for abdominal pain and vomiting.  Genitourinary: Negative for dysuria, frequency, hematuria and urgency.  Musculoskeletal: Positive for myalgias. Negative for arthralgias, back pain and falls.  Skin: Negative for color change and rash.  Neurological: Positive for weakness. Negative for dizziness, seizures, syncope, facial asymmetry, speech difficulty, light-headedness, numbness and headaches.  All other systems reviewed and are negative.    Physical Exam Updated Vital Signs  ED Triage Vitals  Enc Vitals Group     BP 02/01/19 1025  (!) 88/69     Pulse Rate 02/01/19 1025 (!) 59     Resp 02/01/19 1025 18     Temp 02/01/19 1025 97.9 F (36.6 C)     Temp src --      SpO2 02/01/19 1025 100 %     Weight --      Height --      Head Circumference --      Peak Flow --      Pain Score 02/01/19 1026 2     Pain Loc --      Pain Edu? --      Excl. in GC? --     Physical Exam Vitals signs and nursing note reviewed.  Constitutional:      General: She is not in acute distress.    Appearance: She is well-developed. She is not ill-appearing.  HENT:     Head: Normocephalic and atraumatic.     Mouth/Throat:     Mouth: Mucous membranes are moist.  Eyes:  Extraocular Movements: Extraocular movements intact.     Conjunctiva/sclera: Conjunctivae normal.  Neck:     Musculoskeletal: Normal range of motion and neck supple.  Cardiovascular:     Rate and Rhythm: Normal rate and regular rhythm.     Pulses: Normal pulses.     Heart sounds: Normal heart sounds. No murmur.  Pulmonary:     Effort: Pulmonary effort is normal. No respiratory distress.     Breath sounds: Normal breath sounds.  Abdominal:     General: There is no distension.     Palpations: Abdomen is soft.     Tenderness: There is no abdominal tenderness. There is no guarding.  Musculoskeletal: Normal range of motion.     Right lower leg: No edema.     Left lower leg: No edema.  Skin:    General: Skin is warm and dry.     Capillary Refill: Capillary refill takes less than 2 seconds.  Neurological:     General: No focal deficit present.     Mental Status: She is alert and oriented to person, place, and time.     Cranial Nerves: No cranial nerve deficit.     Sensory: No sensory deficit.     Motor: No weakness.     Coordination: Coordination normal.  Psychiatric:        Mood and Affect: Mood normal.      ED Treatments / Results  Labs (all labs ordered are listed, but only abnormal results are displayed) Labs Reviewed  COMPREHENSIVE METABOLIC PANEL -  Abnormal; Notable for the following components:      Result Value   Sodium 129 (*)    CO2 10 (*)    Glucose, Bld 125 (*)    BUN 69 (*)    Creatinine, Ser 5.32 (*)    Albumin 3.0 (*)    AST 13 (*)    GFR calc non Af Amer 8 (*)    GFR calc Af Amer 9 (*)    Anion gap 16 (*)    All other components within normal limits  URINALYSIS, ROUTINE W REFLEX MICROSCOPIC - Abnormal; Notable for the following components:   APPearance TURBID (*)    Hgb urine dipstick MODERATE (*)    Protein, ur 30 (*)    Leukocytes,Ua LARGE (*)    WBC, UA >50 (*)    Bacteria, UA FEW (*)    All other components within normal limits  D-DIMER, QUANTITATIVE (NOT AT [facility]) - Abnormal; Notable for the following components:   D-Dimer, Quant 0.72 (*)    All other components within normal limits  CBC WITH DIFFERENTIAL/PLATELET - Abnormal; Notable for the following components:   WBC 14.1 (*)    RBC 3.39 (*)    Hemoglobin 10.9 (*)    HCT 35.1 (*)    MCV 103.5 (*)    Neutro Abs 11.8 (*)    Monocytes Absolute 1.3 (*)    Abs Immature Granulocytes 0.10 (*)    All other components within normal limits  PHOSPHORUS - Abnormal; Notable for the following components:   Phosphorus 5.5 (*)    All other components within normal limits  PROTEIN / CREATININE RATIO, URINE - Abnormal; Notable for the following components:   Protein Creatinine Ratio 1.89 (*)    All other components within normal limits  POCT I-STAT EG7 - Abnormal; Notable for the following components:   pH, Ven 7.039 (*)    pCO2, Ven 35.0 (*)    Bicarbonate 9.4 (*)  TCO2 10 (*)    Acid-base deficit 20.0 (*)    Sodium 128 (*)    HCT 35.0 (*)    Hemoglobin 11.9 (*)    All other components within normal limits  URINE CULTURE  LACTIC ACID, PLASMA  PROTIME-INR  INFLUENZA PANEL BY PCR (TYPE A & B)  MAGNESIUM  NA AND K (SODIUM & POTASSIUM), RAND UR  ETHANOL  RAPID URINE DRUG SCREEN, HOSP PERFORMED  LACTIC ACID, PLASMA  CBC WITH DIFFERENTIAL/PLATELET  BLOOD  GAS, VENOUS  SALICYLATE LEVEL  ACETAMINOPHEN LEVEL  VOLATILES,BLD-ACETONE,ETHANOL,ISOPROP,METHANOL  SODIUM, URINE, RANDOM  CREATININE, URINE, RANDOM  I-STAT TROPONIN, ED  TYPE AND SCREEN  ABO/RH    EKG EKG Interpretation  Date/Time:  Friday February 01 2019 11:18:44 EST Ventricular Rate:  66 PR Interval:    QRS Duration: 96 QT Interval:  408 QTC Calculation: 428 R Axis:   41 Text Interpretation:  Sinus rhythm Consider left atrial enlargement Low voltage, precordial leads Confirmed by Virgina Norfolk [phone]) on 02/01/2019 11:29:32 AM   Radiology Dg Chest 2 View  Result Date: 02/01/2019 CLINICAL DATA:  Cough.  Hypotension EXAM: CHEST - 2 VIEW COMPARISON:  None. FINDINGS: There is mild scarring in the anterior right base. There is no edema or consolidation. Heart size and pulmonary vascularity are normal. No adenopathy. No bone lesions. IMPRESSION: Scarring anterior right base. No edema or consolidation. Heart size within normal limits. Electronically Signed   By: Bretta Bang III M.D.   On: 02/01/2019 11:16    Procedures .Critical Care Performed by: Virgina Norfolk, DO Authorized by: Virgina Norfolk, DO   Critical care provider statement:    Critical care time (minutes):  50   Critical care was necessary to treat or prevent imminent or life-threatening deterioration of the following conditions:  Metabolic crisis and renal failure   Critical care was time spent personally by me on the following activities:  Blood draw for specimens, discussions with consultants, discussions with primary provider, development of treatment plan with patient or surrogate, evaluation of patient's response to treatment, examination of patient, obtaining history from patient or surrogate, ordering and performing treatments and interventions, ordering and review of laboratory studies, ordering and review of radiographic studies, pulse oximetry, re-evaluation of patient's condition and review of old  charts   I assumed direction of critical care for this patient from another provider in my specialty: no     (including critical care time)  Medications Ordered in ED Medications  sodium chloride flush (NS) 0.9 % injection 3 mL (has no administration in time range)  sodium bicarbonate 150 mEq in sterile water 1,000 mL infusion ( Intravenous New Bag/Given 02/01/19 1338)  sodium chloride 0.9 % bolus 1,000 mL (0 mLs Intravenous Stopped 02/01/19 1324)  sodium bicarbonate injection 50 mEq (50 mEq Intravenous Given 02/01/19 1335)     Initial Impression / Assessment and Plan / ED Course  I have reviewed the triage vital signs and the nursing notes.  Pertinent labs & imaging results that were available during my care of the patient were reviewed by me and considered in my medical decision making (see chart for details).     Ana Ryan is a 63 year old female with history of hypertension who presents to the ED with hypotension and lethargy from urgent care.  Patient with unremarkable vitals upon my arrival.  No fever.  Patient states several days of decreased energy, cough, fatigue.  Denies any sputum production, fever, body aches, chills.  Patient denies any shortness of  breath, chest pain, abdominal pain.  Patient has normal neurological exam.  Exam is overall unremarkable.  No abdominal tenderness.  EKG showed sinus rhythm with no signs of ischemic changes.  Patient had blood pressure of 80 systolic at urgent care.  Possibly from dehydration, PE, cardiac process, infectious process.  Will get lab work including troponin, d-dimer, urine studies, chest x-ray.  Will get flu panel.  Will give normal saline bolus.  Lab work shows significant kidney injury with a creatinine of 5.23.  Bicarb of 10.  Patient with normal potassium.  Sodium of 129.  Mild anion gap of 16.  At that time patient was reinterviewed and denies any recent GI losses.  No nausea, no vomiting, no diarrhea.  Daughter states that  patient does have a history of alcohol abuse.  Has been using some over-the-counter cough medicine but does not appear to be overusing it.  Denies any change in urinary output.  Patient denies any other alcohol use including ethylene glycol, methanol.  Denies any Tylenol or salicylate use. Overall has poor diet, daughter thinks mostly ETOH based diet. Work-up was expanded to include urine electrolyte studies, Tylenol, salicylate level, toxic alcohol levels, blood gas.  Patient showed no signs of pneumonia, overall equivocal urinalysis and will get urine culture and hold on antibiotics at this time.  Patient had pH of 7.039.  Overall patient with a metabolic, anion gap acidosis.  Nephrology was consulted and they recommend bicarb infusion and admission to medicine.  Patient with multiple metabolic derangements.  Etiology overall unknown at this time.  Possible toxic related problem versus renal tubular acidosis vs starvation ketosis.  Troponin normal.  D-dimer elevated however unlikely PE given the circumstances.  Unable to get imaging at this time secondary to AKI.  Patient remained hemodynamically stable throughout my care and admitted to hospitalist service on bicarb drip.  This chart was dictated using voice recognition software.  Despite best efforts to proofread,  errors can occur which can change the documentation meaning.    Final Clinical Impressions(s) / ED Diagnoses   Final diagnoses:  AKI (acute kidney injury) (HCC)  High anion gap metabolic acidosis    ED Discharge Orders    None       Virgina Norfolk, DO 02/01/19 1359

## 2019-02-02 LAB — BASIC METABOLIC PANEL
Anion gap: 13 (ref 5–15)
BUN: 62 mg/dL — ABNORMAL HIGH (ref 8–23)
CHLORIDE: 99 mmol/L (ref 98–111)
CO2: 20 mmol/L — ABNORMAL LOW (ref 22–32)
Calcium: 8.1 mg/dL — ABNORMAL LOW (ref 8.9–10.3)
Creatinine, Ser: 4.22 mg/dL — ABNORMAL HIGH (ref 0.44–1.00)
GFR calc Af Amer: 12 mL/min — ABNORMAL LOW (ref >60)
GFR calc non Af Amer: 10 mL/min — ABNORMAL LOW (ref >60)
Glucose, Bld: 106 mg/dL — ABNORMAL HIGH (ref 70–99)
Potassium: 3.6 mmol/L (ref 3.5–5.1)
Sodium: 132 mmol/L — ABNORMAL LOW (ref 135–145)

## 2019-02-02 LAB — CBC
HEMATOCRIT: 27.6 % — AB (ref 36.0–46.0)
Hemoglobin: 9.4 g/dL — ABNORMAL LOW (ref 12.0–15.0)
MCH: 31.3 pg (ref 26.0–34.0)
MCHC: 34.1 g/dL (ref 30.0–36.0)
MCV: 92 fL (ref 80.0–100.0)
Platelets: 222 10*3/uL (ref 150–400)
RBC: 3 MIL/uL — ABNORMAL LOW (ref 3.87–5.11)
RDW: 13.9 % (ref 11.5–15.5)
WBC: 10 10*3/uL (ref 4.0–10.5)
nRBC: 0 % (ref 0.0–0.2)

## 2019-02-02 LAB — RENAL FUNCTION PANEL
Albumin: 2.4 g/dL — ABNORMAL LOW (ref 3.5–5.0)
Anion gap: 14 (ref 5–15)
BUN: 66 mg/dL — ABNORMAL HIGH (ref 8–23)
CO2: 16 mmol/L — ABNORMAL LOW (ref 22–32)
Calcium: 8 mg/dL — ABNORMAL LOW (ref 8.9–10.3)
Chloride: 101 mmol/L (ref 98–111)
Creatinine, Ser: 4.82 mg/dL — ABNORMAL HIGH (ref 0.44–1.00)
GFR calc non Af Amer: 9 mL/min — ABNORMAL LOW (ref >60)
GFR, EST AFRICAN AMERICAN: 10 mL/min — AB (ref >60)
Glucose, Bld: 105 mg/dL — ABNORMAL HIGH (ref 70–99)
Phosphorus: 4.6 mg/dL (ref 2.5–4.6)
Potassium: 2.4 mmol/L — CL (ref 3.5–5.1)
Sodium: 131 mmol/L — ABNORMAL LOW (ref 135–145)

## 2019-02-02 LAB — HIV ANTIBODY (ROUTINE TESTING W REFLEX): HIV Screen 4th Generation wRfx: NONREACTIVE

## 2019-02-02 LAB — MAGNESIUM: Magnesium: 1.8 mg/dL (ref 1.7–2.4)

## 2019-02-02 MED ORDER — MAGNESIUM SULFATE IN D5W 1-5 GM/100ML-% IV SOLN
1.0000 g | Freq: Once | INTRAVENOUS | Status: AC
  Administered 2019-02-02: 1 g via INTRAVENOUS
  Filled 2019-02-02: qty 100

## 2019-02-02 MED ORDER — POTASSIUM CHLORIDE CRYS ER 20 MEQ PO TBCR
40.0000 meq | EXTENDED_RELEASE_TABLET | ORAL | Status: AC
  Administered 2019-02-02 (×2): 40 meq via ORAL
  Filled 2019-02-02 (×2): qty 2

## 2019-02-02 MED ORDER — POTASSIUM CHLORIDE 10 MEQ/100ML IV SOLN
10.0000 meq | INTRAVENOUS | Status: AC
  Administered 2019-02-02 (×4): 10 meq via INTRAVENOUS
  Filled 2019-02-02 (×4): qty 100

## 2019-02-02 NOTE — Progress Notes (Signed)
[facility] Progress Note   Subjective:   Feeling improved.  Daughter and husband bedside.  Cough dry and minor.  Feels stronger, better mentation.  No new issues.  No f/c, no worse dysuria.  I/Os 1.9 / 0.8L yesterday  Objective Vitals:   02/01/19 1530 02/01/19 1630 02/01/19 2058 02/02/19 0544  BP:   (!) 157/87 (!) 161/88  Pulse: 80  85 81  Resp: 18   18  Temp:   98.2 F (36.8 C) 98.7 F (37.1 C)  TempSrc:   Oral Oral  SpO2: 99%  100% 98%  Weight:      Height:  5\' 2"  (1.575 m)     Physical Exam General: sitting up in bed HEENT: MMM, fine dentition, no oral ulcers Eyes: anciteric Neck: supple, no JVD Heart: RRR Lungs: normal WOB, no cough in visit Abdomen: soft, nontender GU: b no foley Extremities: no edema, normal muscle bulk Skin: no rashes noted Neuro: more alert  Additional Objective Labs: Basic Metabolic Panel: Recent Labs  Lab 02/01/19 1035 02/01/19 1219 02/01/19 1225 02/01/19 1658 02/02/19 0439  NA 129*  --  128* 132* 131*  K 4.0  --  4.0 3.6 2.4*  CL 103  --   --  106 101  CO2 10*  --   --  11* 16*  GLUCOSE 125*  --   --  106* 105*  BUN 69*  --   --  68* 66*  CREATININE 5.32*  --   --  5.20* 4.82*  CALCIUM 9.6  --   --  8.7* 8.0*  PHOS  --  5.5*  --   --  4.6   Liver Function Tests: Recent Labs  Lab 02/01/19 1035 02/02/19 0439  AST 13*  --   ALT 13  --   ALKPHOS 76  --   BILITOT 0.3  --   PROT 7.0  --   ALBUMIN 3.0* 2.4*   No results for input(s): LIPASE, AMYLASE in the last 168 hours. CBC: Recent Labs  Lab 02/01/19 1139 02/01/19 1225 02/02/19 0810  WBC 14.1*  --  10.0  NEUTROABS 11.8*  --   --   HGB 10.9* 11.9* 9.4*  HCT 35.1* 35.0* 27.6*  MCV 103.5*  --  92.0  PLT 220  --  222   Blood Culture    Component Value Date/Time   SDES URINE, CLEAN CATCH 02/01/2019 1513   SPECREQUEST  02/01/2019 1513    NONE Performed at [facility], [address], [place], [place] [address]    CULT >=100,000  COLONIES/mL GRAM NEGATIVE RODS (A) 02/01/2019 1513   REPTSTATUS PENDING 02/01/2019 1513    Cardiac Enzymes: No results for input(s): CKTOTAL, CKMB, CKMBINDEX, TROPONINI in the last 168 hours. CBG: No results for input(s): GLUCAP in the last 168 hours. Iron Studies: No results for input(s): IRON, TIBC, TRANSFERRIN, FERRITIN in the last 72 hours. @lablastinr3 @ Studies/Results: Dg Chest 2 View  Result Date: 02/01/2019 CLINICAL DATA:  Cough.  Hypotension EXAM: CHEST - 2 VIEW COMPARISON:  None. FINDINGS: There is mild scarring in the anterior right base. There is no edema or consolidation. Heart size and pulmonary vascularity are normal. No adenopathy. No bone lesions. IMPRESSION: Scarring anterior right base. No edema or consolidation. Heart size within normal limits. Electronically Signed   By: Bretta Bang III M.D.   On: 02/01/2019 11:16   US Renal  Result Date: 02/01/2019 CLINICAL DATA:  Acute renal injury EXAM: RENAL / URINARY TRACT  ULTRASOUND COMPLETE COMPARISON:  CT 03/16/2016 FINDINGS: Right Kidney: Renal measurements: 8.8 x 4.5 x 5.3 cm = volume: 109 mL . Echogenicity within normal limits. No mass or hydronephrosis visualized. Left Kidney: Renal measurements: 10.3 x 5.9 x 7.2 = volume: 230 mL. Echogenicity within normal limits. No mass or hydronephrosis visualized. Bladder: Appears normal for degree of bladder distention. IMPRESSION: No sonographic findings to explain the patient's acute renal injury. Electronically Signed   By: Tollie Eth M.D.   On: 02/01/2019 15:20   Medications: . magnesium sulfate 1 - 4 g bolus IVPB    . potassium chloride 10 mEq (02/02/19 1035)  .  sodium bicarbonate (isotonic) infusion in sterile water 125 mL/hr at 02/02/19 0622   . cephALEXin  250 mg Oral Q12H  . docusate sodium  100 mg Oral BID  . enoxaparin (LOVENOX) injection  30 mg Subcutaneous Q24H  . metoprolol succinate  50 mg Oral Daily  . sodium chloride flush  3 mL Intravenous Q12H       Assessment/Plan: **AKI:  No history of renal disease and last known Cr 0.6 in 2017.  Cr 5.32 in setting of what seems to be weeks of poor po intake + NSAIDs (heavy; chronic at least daily use) + hypotension.  Suspect this is prerenal either hypovolemia or progressed to ATN.  Unclear timeline but it seems she was well until about 3 weeks ago.  Improved to 4.82 with fluids and improved BP (now hypertensive).  Renal US normal.  Una 37, Fena 2.2.    UOP and improvement in renal function reassuring.  By rate of recovery I expect there is a component of ATN here but thankfully she's improving.   Continue supportive care.  IVF.  Holding ACE-I, no NSAIDs  **Acidosis, non AG metabolic + resp acidosis:  Most likely secondary to AKI.   Improving on bicarb gtt.  Serum bicarb 16 now.   --Continue on bicarb gtt through today.  --Will transition to orals tomorrow  **Hyponatremia, mild: presumably hypovolemic.  Improving with hydration.   **Anemia: per primary.   **UTI: culture shows > 100k cfu/mL GNR, on keflex empirically.  Has h/o UTIs.   Estill Bakes MD 02/02/2019, 11:20 AM  [facility] Pager: [phone]

## 2019-02-02 NOTE — Progress Notes (Signed)
criCRITICAL VALUE ALERT  Critical Value:  Potassium 2.4  Date & Time Notied:  02/02/2019 at 0556  Provider Notified: Donnamarie Poag  Orders Received/Actions taken: potassium chloride SA 40 meq every 4 hrs.

## 2019-02-03 LAB — URINE CULTURE: Culture: >=100000 — AB

## 2019-02-03 LAB — BASIC METABOLIC PANEL
Anion gap: 11 (ref 5–15)
BUN: 56 mg/dL — ABNORMAL HIGH (ref 8–23)
CO2: 26 mmol/L (ref 22–32)
Calcium: 7.8 mg/dL — ABNORMAL LOW (ref 8.9–10.3)
Chloride: 96 mmol/L — ABNORMAL LOW (ref 98–111)
Creatinine, Ser: 3.9 mg/dL — ABNORMAL HIGH (ref 0.44–1.00)
GFR calc Af Amer: 13 mL/min — ABNORMAL LOW (ref >60)
GFR calc non Af Amer: 11 mL/min — ABNORMAL LOW (ref >60)
Glucose, Bld: 105 mg/dL — ABNORMAL HIGH (ref 70–99)
Potassium: 2.7 mmol/L — CL (ref 3.5–5.1)
Sodium: 133 mmol/L — ABNORMAL LOW (ref 135–145)

## 2019-02-03 LAB — MAGNESIUM: Magnesium: 2 mg/dL (ref 1.7–2.4)

## 2019-02-03 LAB — POTASSIUM: Potassium: 3.7 mmol/L (ref 3.5–5.1)

## 2019-02-03 MED ORDER — POTASSIUM CHLORIDE CRYS ER 20 MEQ PO TBCR
40.0000 meq | EXTENDED_RELEASE_TABLET | Freq: Once | ORAL | Status: AC
  Administered 2019-02-03: 40 meq via ORAL
  Filled 2019-02-03: qty 2

## 2019-02-03 MED ORDER — POTASSIUM CHLORIDE 10 MEQ/100ML IV SOLN
10.0000 meq | INTRAVENOUS | Status: AC
  Administered 2019-02-03 (×2): 10 meq via INTRAVENOUS
  Filled 2019-02-03 (×2): qty 100

## 2019-02-03 MED ORDER — POTASSIUM CHLORIDE 10 MEQ/100ML IV SOLN
10.0000 meq | INTRAVENOUS | Status: DC
  Administered 2019-02-03 (×4): 10 meq via INTRAVENOUS
  Filled 2019-02-03 (×4): qty 100

## 2019-02-03 MED ORDER — AMLODIPINE BESYLATE 10 MG PO TABS
10.0000 mg | ORAL_TABLET | Freq: Every day | ORAL | Status: DC
  Administered 2019-02-03 – 2019-02-04 (×2): 10 mg via ORAL
  Filled 2019-02-03 (×2): qty 1

## 2019-02-03 MED ORDER — LACTATED RINGERS IV SOLN
INTRAVENOUS | Status: AC
  Administered 2019-02-03 (×2): via INTRAVENOUS

## 2019-02-03 NOTE — Progress Notes (Signed)
PROGRESS NOTE                                                                                                                                                                  Note patient was seen, plan was discussed with daughter bedside on 02/02/2019.  Note was missed in error.                                           Patient Demographics:    Ana Ryan, is a 63 y.o. female, DOB - March 04, 1956, PET:[id number]  Admit date - 02/01/2019   Admitting Physician Jonah Blue, MD  Outpatient Primary MD for the patient is Maple Hudson., MD  LOS - 2  Chief Complaint  Patient presents with  . Hypotension       Brief Narrative  Ana Ryan is a 63 y.o. female with medical history significant of HTN presenting with cough.  She hasn't been feeling well, has had a persistent but dry cough.  Some light-headedness.  She fell about 3 days ago.  She hurt her face in the fall.  Symptoms have been going on for about 10 days.  Her daughter saw her last weekend and her symptoms were clearly much better at that time.  She last felt "normal" about 2 weeks ago.  She has had a diminishing appetite in that time and increasing fatigue.    She was finally diagnosed with acute renal failure and admitted to the hospital.  She does have history of NSAID use and alcohol abuse in the past but last alcoholic drink was about 2 weeks prior to hospitalization.   Subjective:    Burnett Harry today has, No headache, No chest pain, No abdominal pain - No Nausea, No new weakness tingling or numbness, No Cough - SOB.     Assessment  & Plan :     1.  Acute renal failure with severe metabolic acidosis.  Likely due to combination of NSAID use and poor oral intake and dehydration.  Improving with IV fluids, good urine output, potassium level stable.  Initially required IV bicarb drip now transition to IV LR, continue monitoring renal function, renal ultrasound nonacute, if stable will discharge tomorrow home  with outpatient PCP and nephrology follow-up as needed.  Or not to use NSAIDs in the future.  2.  Metabolic acidosis.  Due to #1 above resolved.  Stop bicarb drip.  3.  Hypokalemia.  Replaced.  4.  HX of alcohol abuse.  Last drink 2 weeks ago.  Counseled to quit completely.  No signs of withdrawal.  5.  E. coli UTI.  On Keflex.  Complete course of 7 days total per nephrology as multiple events of UTI in the past.    Family Communication  :  Daughter bedside 02/02/19  Code Status :  Full  Disposition Plan  :  Home in am  Consults  :  Renal  Procedures  :    Renal ultrasound.  None acute.  DVT Prophylaxis  :  Lovenox   Lab Results  Component Value Date   PLT 222 02/02/2019    Diet :  Diet Order            Diet regular Room service appropriate? Yes; Fluid consistency: Thin  Diet effective now               Inpatient Medications Scheduled Meds: . cephALEXin  250 mg Oral Q12H  . docusate sodium  100 mg Oral BID  . enoxaparin (LOVENOX) injection  30 mg Subcutaneous Q24H  . metoprolol succinate  50 mg Oral Daily  . potassium chloride  40 mEq Oral Once  . sodium chloride flush  3 mL Intravenous Q12H   Continuous Infusions: . lactated ringers    . potassium chloride 10 mEq (02/03/19 0948)   PRN Meds:.acetaminophen **OR** [DISCONTINUED] acetaminophen, albuterol, calcium carbonate (dosed in mg elemental calcium), camphor-menthol **AND** hydrOXYzine, docusate sodium, feeding supplement (NEPRO CARB STEADY), [DISCONTINUED] ondansetron **OR** ondansetron (ZOFRAN) IV, sorbitol, zolpidem  Antibiotics  :   Anti-infectives (From admission, onward)   Start     Dose/Rate Route Frequency Ordered Stop   02/01/19 1430  cephALEXin (KEFLEX) capsule 250 mg     250 mg Oral Every 12 hours 02/01/19 1416            Objective:   Vitals:   02/02/19 1557 02/02/19 2144 02/03/19 0453 02/03/19 0548  BP: (!) 145/89 (!) 160/79 (!) 155/91   Pulse: 81 85 88   Resp: 18 20 18    Temp:  98.7 F (37.1 C) 98.7 F (37.1 C) 98.2 F (36.8 C)   TempSrc: Oral Oral Oral   SpO2: 100% 100% 98%   Weight:    73.5 kg  Height:        Wt Readings from Last 3 Encounters:  02/03/19 73.5 kg  10/03/16 72.6 kg  04/13/16 78 kg     Intake/Output Summary (Last 24 hours) at 02/03/2019 1041 Last data filed at 02/03/2019 0603 Gross per 24 hour  Intake 1337.59 ml  Output -  Net 1337.59 ml     Physical Exam  Awake Alert, Oriented X 3, No new F.N deficits, Normal affect [place].AT,PERRAL Supple Neck,No JVD, No cervical lymphadenopathy appriciated.  Symmetrical Chest wall movement, Good air movement bilaterally, CTAB RRR,No Gallops,Rubs or new Murmurs, No Parasternal Heave +ve B.Sounds, Abd Soft, No tenderness, No organomegaly appriciated, No rebound - guarding or rigidity. No Cyanosis, Clubbing or edema, No new Rash or bruise       Data Review:    CBC Recent Labs  Lab 02/01/19 1139 02/01/19 1225 02/02/19 0810  WBC 14.1*  --  10.0  HGB 10.9* 11.9* 9.4*  HCT 35.1* 35.0* 27.6*  PLT 220  --  222  MCV 103.5*  --  92.0  MCH 32.2  --  31.3  MCHC 31.1  --  34.1  RDW 14.6  --  13.9  LYMPHSABS 0.9  --   --   MONOABS 1.3*  --   --   EOSABS 0.0  --   --   BASOSABS 0.0  --   --  Chemistries  Recent Labs  Lab 02/01/19 1035 02/01/19 1219 02/01/19 1225 02/01/19 1658 02/02/19 0439 02/02/19 0810 02/02/19 1638 02/03/19 0538  NA 129*  --  128* 132* 131*  --  132* 133*  K 4.0  --  4.0 3.6 2.4*  --  3.6 2.7*  CL 103  --   --  106 101  --  99 96*  CO2 10*  --   --  11* 16*  --  20* 26  GLUCOSE 125*  --   --  106* 105*  --  106* 105*  BUN 69*  --   --  68* 66*  --  62* 56*  CREATININE 5.32*  --   --  5.20* 4.82*  --  4.22* 3.90*  CALCIUM 9.6  --   --  8.7* 8.0*  --  8.1* 7.8*  MG  --  2.3  --   --   --  1.8  --  2.0  AST 13*  --   --   --   --   --   --   --   ALT 13  --   --   --   --   --   --   --   ALKPHOS 76  --   --   --   --   --   --   --   BILITOT 0.3  --   --    --   --   --   --   --    ------------------------------------------------------------------------------------------------------------------ No results for input(s): CHOL, HDL, LDLCALC, TRIG, CHOLHDL, LDLDIRECT in the last 72 hours.  Lab Results  Component Value Date   HGBA1C 5.5 02/01/2019   ------------------------------------------------------------------------------------------------------------------ No results for input(s): TSH, T4TOTAL, T3FREE, THYROIDAB in the last 72 hours.  Invalid input(s): FREET3 ------------------------------------------------------------------------------------------------------------------ No results for input(s): VITAMINB12, FOLATE, FERRITIN, TIBC, IRON, RETICCTPCT in the last 72 hours.  Coagulation profile Recent Labs  Lab 02/01/19 1035  INR 1.03    Recent Labs    02/01/19 1050  DDIMER 0.72*    Cardiac Enzymes No results for input(s): CKMB, TROPONINI, MYOGLOBIN in the last 168 hours.  Invalid input(s): CK ------------------------------------------------------------------------------------------------------------------ No results found for: BNP  Micro Results Recent Results (from the past 240 hour(s))  Urine culture     Status: Abnormal   Collection Time: 02/01/19  3:13 PM  Result Value Ref Range Status   Specimen Description URINE, CLEAN CATCH  Final   Special Requests   Final    NONE Performed at [facility], [address] [place], [place] [address]    Culture >=100,000 COLONIES/mL ESCHERICHIA COLI (A)  Final   Report Status 02/03/2019 FINAL  Final   Organism ID, Bacteria ESCHERICHIA COLI (A)  Final      Susceptibility   Escherichia coli - MIC*    AMPICILLIN <=2 SENSITIVE Sensitive     CEFAZOLIN <=4 SENSITIVE Sensitive     CEFTRIAXONE <=1 SENSITIVE Sensitive     CIPROFLOXACIN <=0.25 SENSITIVE Sensitive     GENTAMICIN <=1 SENSITIVE Sensitive     IMIPENEM <=0.25 SENSITIVE Sensitive     NITROFURANTOIN <=16 SENSITIVE  Sensitive     TRIMETH/SULFA <=20 SENSITIVE Sensitive     AMPICILLIN/SULBACTAM <=2 SENSITIVE Sensitive     PIP/TAZO <=4 SENSITIVE Sensitive     Extended ESBL NEGATIVE Sensitive     * >=100,000 COLONIES/mL ESCHERICHIA COLI  MRSA PCR Screening     Status: None   Collection Time: 02/01/19  4:47 PM  Result Value Ref Range Status   MRSA by PCR NEGATIVE NEGATIVE Final    Comment:        The GeneXpert MRSA Assay (FDA approved for NASAL specimens only), is one component of a comprehensive MRSA colonization surveillance program. It is not intended to diagnose MRSA infection nor to guide or monitor treatment for MRSA infections. Performed at [facility], [address], [place], [place] [address]     Radiology Reports Dg Chest 2 View  Result Date: 02/01/2019 CLINICAL DATA:  Cough.  Hypotension EXAM: CHEST - 2 VIEW COMPARISON:  None. FINDINGS: There is mild scarring in the anterior right base. There is no edema or consolidation. Heart size and pulmonary vascularity are normal. No adenopathy. No bone lesions. IMPRESSION: Scarring anterior right base. No edema or consolidation. Heart size within normal limits. Electronically Signed   By: Bretta Bang III M.D.   On: 02/01/2019 11:16   US Renal  Result Date: 02/01/2019 CLINICAL DATA:  Acute renal injury EXAM: RENAL / URINARY TRACT ULTRASOUND COMPLETE COMPARISON:  CT 03/16/2016 FINDINGS: Right Kidney: Renal measurements: 8.8 x 4.5 x 5.3 cm = volume: 109 mL . Echogenicity within normal limits. No mass or hydronephrosis visualized. Left Kidney: Renal measurements: 10.3 x 5.9 x 7.2 = volume: 230 mL. Echogenicity within normal limits. No mass or hydronephrosis visualized. Bladder: Appears normal for degree of bladder distention. IMPRESSION: No sonographic findings to explain the patient's acute renal injury. Electronically Signed   By: Tollie Eth M.D.   On: 02/01/2019 15:20    Time Spent in minutes  30   Susa Raring M.D on 02/03/2019  at 10:41 AM  To page go to [email or web address] - password [email or web address]

## 2019-02-03 NOTE — Progress Notes (Signed)
[facility] Progress Note   Subjective:   Feeling improved.  Daughter at bedside. No new issues.  Dysuria gone Outs NR yesterday - she says normal vol and character.  Objective Vitals:   02/02/19 1557 02/02/19 2144 02/03/19 0453 02/03/19 0548  BP: (!) 145/89 (!) 160/79 (!) 155/91   Pulse: 81 85 88   Resp: 18 20 18    Temp: 98.7 F (37.1 C) 98.7 F (37.1 C) 98.2 F (36.8 C)   TempSrc: Oral Oral Oral   SpO2: 100% 100% 98%   Weight:    73.5 kg  Height:       Physical Exam General: sitting up in bed HEENT: MMM, fine dentition, no oral ulcers Eyes: anciteric Neck: supple, no JVD Heart: RRR Lungs: normal WOB, no cough Abdomen: soft, nontender GU: no foley Extremities: no edema, normal muscle bulk Skin: no rashes noted Neuro: normally conversant  Additional Objective Labs: Basic Metabolic Panel: Recent Labs  Lab 02/01/19 1219  02/02/19 0439 02/02/19 1638 02/03/19 0538  NA  --    < > 131* 132* 133*  K  --    < > 2.4* 3.6 2.7*  CL  --    < > 101 99 96*  CO2  --    < > 16* 20* 26  GLUCOSE  --    < > 105* 106* 105*  BUN  --    < > 66* 62* 56*  CREATININE  --    < > 4.82* 4.22* 3.90*  CALCIUM  --    < > 8.0* 8.1* 7.8*  PHOS 5.5*  --  4.6  --   --    < > = values in this interval not displayed.   Liver Function Tests: Recent Labs  Lab 02/01/19 1035 02/02/19 0439  AST 13*  --   ALT 13  --   ALKPHOS 76  --   BILITOT 0.3  --   PROT 7.0  --   ALBUMIN 3.0* 2.4*   No results for input(s): LIPASE, AMYLASE in the last 168 hours. CBC: Recent Labs  Lab 02/01/19 1139 02/01/19 1225 02/02/19 0810  WBC 14.1*  --  10.0  NEUTROABS 11.8*  --   --   HGB 10.9* 11.9* 9.4*  HCT 35.1* 35.0* 27.6*  MCV 103.5*  --  92.0  PLT 220  --  222   Blood Culture    Component Value Date/Time   SDES URINE, CLEAN CATCH 02/01/2019 1513   SPECREQUEST  02/01/2019 1513    NONE Performed at [facility], [address] [place], [place] [address]    CULT  >=100,000 COLONIES/mL ESCHERICHIA COLI (A) 02/01/2019 1513   REPTSTATUS 02/03/2019 FINAL 02/01/2019 1513    Cardiac Enzymes: No results for input(s): CKTOTAL, CKMB, CKMBINDEX, TROPONINI in the last 168 hours. CBG: No results for input(s): GLUCAP in the last 168 hours. Iron Studies: No results for input(s): IRON, TIBC, TRANSFERRIN, FERRITIN in the last 72 hours. @lablastinr3 @ Studies/Results: Dg Chest 2 View  Result Date: 02/01/2019 CLINICAL DATA:  Cough.  Hypotension EXAM: CHEST - 2 VIEW COMPARISON:  None. FINDINGS: There is mild scarring in the anterior right base. There is no edema or consolidation. Heart size and pulmonary vascularity are normal. No adenopathy. No bone lesions. IMPRESSION: Scarring anterior right base. No edema or consolidation. Heart size within normal limits. Electronically Signed   By: Bretta Bang III M.D.   On: 02/01/2019 11:16   US Renal  Result Date: 02/01/2019 CLINICAL DATA:  Acute renal injury EXAM: RENAL / URINARY TRACT ULTRASOUND COMPLETE COMPARISON:  CT 03/16/2016 FINDINGS: Right Kidney: Renal measurements: 8.8 x 4.5 x 5.3 cm = volume: 109 mL . Echogenicity within normal limits. No mass or hydronephrosis visualized. Left Kidney: Renal measurements: 10.3 x 5.9 x 7.2 = volume: 230 mL. Echogenicity within normal limits. No mass or hydronephrosis visualized. Bladder: Appears normal for degree of bladder distention. IMPRESSION: No sonographic findings to explain the patient's acute renal injury. Electronically Signed   By: Tollie Eth M.D.   On: 02/01/2019 15:20   Medications: . lactated ringers    . potassium chloride 10 mEq (02/03/19 0948)   . cephALEXin  250 mg Oral Q12H  . docusate sodium  100 mg Oral BID  . enoxaparin (LOVENOX) injection  30 mg Subcutaneous Q24H  . metoprolol succinate  50 mg Oral Daily  . potassium chloride  40 mEq Oral Once  . sodium chloride flush  3 mL Intravenous Q12H      Assessment/Plan: **AKI:  No history of renal disease  and last known Cr 0.6 in 2017.  Cr 5.32 in setting of what seems to be weeks of poor po intake + NSAIDs (heavy; chronic at least daily use) + hypotension.  Suspect this is prerenal either hypovolemia or progressed to ATN.  Unclear timeline but it seems she was well until about 3 weeks ago.  Improved to 3.9 with fluids and improved BP (now hypertensive).  Renal US normal.  Una 37, Fena 2.2.    UOP and improvement in renal function reassuring.   Continue supportive care.  D/c IVF now, watch on oral intake alone.   Avoid NSAIDs  If continued improvement tomorrow expect she can D/C.  I'll arrange for labs to be checked Thursday at [facility].   **Acidosis, non AG metabolic + resp acidosis:  Most likely secondary to AKI.  Resolved.  D/c bicarb gtt  **Hyponatremia, mild: presumably hypovolemic.  Improving with hydration.   **Anemia: per primary.   **UTI: culture shows > 100k cfu/mL E coli, on keflex empirically.  Has h/o UTIs.  Would treat for 7 days.   **HTN: presented with hypotension, hypertensive now but large Na load with IVF.  Monitor.   Estill Bakes MD 02/03/2019, 10:18 AM  [facility] Pager: [phone]

## 2019-02-03 NOTE — Progress Notes (Signed)
CRITICAL VALUE ALERT  Critical Value: Potassium 2.7  Date & Time Notied: 02/03/19 0735  Provider Notified: Susa Raring MD  Orders Received/Actions taken: Doctor notified, awaiting orders. Will continue to monitor

## 2019-02-04 LAB — BASIC METABOLIC PANEL
Anion gap: 11 (ref 5–15)
BUN: 45 mg/dL — ABNORMAL HIGH (ref 8–23)
CO2: 28 mmol/L (ref 22–32)
Calcium: 8.1 mg/dL — ABNORMAL LOW (ref 8.9–10.3)
Chloride: 95 mmol/L — ABNORMAL LOW (ref 98–111)
Creatinine, Ser: 3.24 mg/dL — ABNORMAL HIGH (ref 0.44–1.00)
GFR calc Af Amer: 17 mL/min — ABNORMAL LOW (ref >60)
GFR calc non Af Amer: 14 mL/min — ABNORMAL LOW (ref >60)
Glucose, Bld: 116 mg/dL — ABNORMAL HIGH (ref 70–99)
Potassium: 3 mmol/L — ABNORMAL LOW (ref 3.5–5.1)
Sodium: 134 mmol/L — ABNORMAL LOW (ref 135–145)

## 2019-02-04 MED ORDER — POTASSIUM CHLORIDE 10 MEQ/100ML IV SOLN
10.0000 meq | INTRAVENOUS | Status: AC
  Administered 2019-02-04 (×3): 10 meq via INTRAVENOUS
  Filled 2019-02-04 (×3): qty 100

## 2019-02-04 MED ORDER — AMLODIPINE BESYLATE 10 MG PO TABS: 10.0000 mg | ORAL_TABLET | Freq: Every day | ORAL | 11 refills | Status: DC

## 2019-02-04 MED ORDER — POTASSIUM CHLORIDE CRYS ER 20 MEQ PO TBCR
40.0000 meq | EXTENDED_RELEASE_TABLET | Freq: Once | ORAL | Status: AC
  Administered 2019-02-04: 40 meq via ORAL
  Filled 2019-02-04: qty 2

## 2019-02-04 MED ORDER — PANTOPRAZOLE SODIUM 40 MG PO TBEC
40.0000 mg | DELAYED_RELEASE_TABLET | Freq: Every day | ORAL | Status: DC
  Administered 2019-02-04: 40 mg via ORAL

## 2019-02-04 MED ORDER — CEPHALEXIN 250 MG PO CAPS: 250.0000 mg | ORAL_CAPSULE | Freq: Two times a day (BID) | ORAL | 0 refills | Status: DC

## 2019-02-04 NOTE — Plan of Care (Signed)
Nsg Discharge Note  Admit Date:  02/01/2019 Discharge date: 02/04/2019   Gigi Gin to be D/C'd Home per MD order.  AVS completed.  Copy for chart, and copy for patient signed, and dated. Patient/caregiver able to verbalize understanding.  Discharge Medication: Allergies as of 02/04/2019       Reactions   Dilaudid [hydromorphone Hcl] Other (See Comments)   hallucinations        Medication List     STOP taking these medications    ibuprofen 200 MG tablet Commonly known as:  ADVIL,MOTRIN       TAKE these medications    albuterol 108 (90 Base) MCG/ACT inhaler Commonly known as:  PROVENTIL HFA;VENTOLIN HFA Inhale 2 puffs into the lungs every 6 (six) hours as needed (Wheezing, shortness of breath or cough).   amLODipine 10 MG tablet Commonly known as:  NORVASC Take 1 tablet (10 mg total) by mouth daily.   aspirin 81 MG chewable tablet Chew 81 mg by mouth daily as needed for mild pain.   cephALEXin 250 MG capsule Commonly known as:  KEFLEX Take 1 capsule (250 mg total) by mouth every 12 (twelve) hours.   metoprolol succinate 50 MG 24 hr tablet Commonly known as:  TOPROL-XL TAKE 1 TABLET EACH DAY. What changed:  See the new instructions.   NEXIUM 40 MG capsule Generic drug:  esomeprazole TAKE 1 CAPSULE EVERY DAY. What changed:  how much to take        Discharge Assessment: Vitals:   02/04/19 0527 02/04/19 1338  BP: (!) 163/97 135/83  Pulse: 83 87  Resp:  18  Temp: 97.7 F (36.5 C) 98 F (36.7 C)  SpO2: 97% 100%   Skin clean, dry and intact without evidence of skin break down, no evidence of skin tears noted. IV catheter discontinued intact. Site without signs and symptoms of complications - no redness or edema noted at insertion site, patient denies c/o pain - only slight tenderness at site.  Dressing with slight pressure applied.  D/c Instructions-Education: Discharge instructions given to patient/family with verbalized understanding. D/c  education completed with patient/family including follow up instructions, medication list, d/c activities limitations if indicated, with other d/c instructions as indicated by MD - patient able to verbalize understanding, all questions fully answered. Patient instructed to return to ED, call 911, or call MD for any changes in condition.  Patient escorted via WC, and D/C home via private auto.  Camillo Flaming, RN 02/04/2019 2:52 PM

## 2019-02-04 NOTE — Discharge Summary (Signed)
Ana Ryan ZOX:[id number] DOB: March 18, 1956 DOA: 02/01/2019  PCP: Maple Hudson., MD  Admit date: 02/01/2019  Discharge date: 02/04/2019  Admitted From: Home   Disposition:  Home   Recommendations for Outpatient Follow-up:   Follow up with PCP in 1-2 weeks  PCP Please obtain BMP/CBC, 2 view CXR in 1week,  (see Discharge instructions)   PCP Please follow up on the following pending results: monitor BMP closely   Home Health: None   Equipment/Devices: None  Consultations: Renal Discharge Condition: Stable   CODE STATUS: Full   Diet Recommendation: Heart Healthy     Chief Complaint  Patient presents with  . Hypotension     Brief history of present illness from the day of admission and additional interim summary    Ana Ryan a 63 y.o.femalewith medical history significant ofHTN presenting with cough.She hasn't been feeling well, has had a persistent but dry cough. Some light-headedness. She fell about 3 days ago. She hurt her face in the fall. Symptoms have been going on for about 10 days. Her daughter saw her last weekend and her symptoms were clearly much better at that time. She last felt "normal" about 2 weeks ago. She has had a diminishing appetite in that time and increasing fatigue.   She was finally diagnosed with acute renal failure and admitted to the hospital.  She does have history of NSAID use and alcohol abuse in the past but last alcoholic drink was about 2 weeks prior to hospitalization.                                                                 Hospital Course   1.  Acute renal failure with severe metabolic acidosis.  Likely due to combination of NSAID use and poor oral intake and dehydration.    She was treated with aggressive IV fluid hydration and IV bicarb  drip, renal ultrasound was unremarkable, she maintains good urine output and renal function has come down and simply, she is symptom-free, case discussed with nephrologist Dr. Marisue Humble, she will be discharged, has been counseled not to use NSAIDs in the future, requested to follow with PCP in 2 days and nephrology in a week.  Request PCP to monitor her BMP closely.  2.  Metabolic acidosis.  Due to #1 above resolved.  Initially needed bicarb drip.  3.  Hypokalemia.  Replaced aggressively, requested to follow with PCP in 2 days for repeat check.  4.  HX of alcohol abuse.  Last drink 2 weeks ago.  Counseled to quit completely.  No signs of withdrawal.  5.  E. coli UTI.  On Keflex.  Complete course of 7 days total per nephrology as multiple events of UTI in the past.  6.  Essential hypertension.  Placed on combination  of Norvasc and beta-blocker.  PCP to monitor.   Discharge diagnosis     Principal Problem:   Metabolic acidosis Active Problems:   Hypercholesteremia   Acute renal failure (ARF) (HCC)   Hyperglycemia   Alcohol abuse    Discharge instructions    Discharge Instructions    Diet - low sodium heart healthy   Complete by:  As directed    Discharge instructions   Complete by:  As directed    Avoid any NSAID use  Follow with Primary MD Maple Hudson., MD in 2 days   Get CBC, BMP  checked  by Primary MD  in 2 days    Activity: As tolerated with Full fall precautions use walker/cane & assistance as needed  Disposition Home    Diet: Heart Healthy    Special Instructions: If you have smoked or chewed Tobacco  in the last 2 yrs please stop smoking, stop any regular Alcohol  and or any Recreational drug use.  On your next visit with your primary care physician please Get Medicines reviewed and adjusted.  Please request your Prim.MD to go over all Hospital Tests and Procedure/Radiological results at the follow up, please get all Hospital records sent to your  Prim MD by signing hospital release before you go home.  If you experience worsening of your admission symptoms, develop shortness of breath, life threatening emergency, suicidal or homicidal thoughts you must seek medical attention immediately by calling 911 or calling your MD immediately  if symptoms less severe.  You Must read complete instructions/literature along with all the possible adverse reactions/side effects for all the Medicines you take and that have been prescribed to you. Take any new Medicines after you have completely understood and accpet all the possible adverse reactions/side effects.   Increase activity slowly   Complete by:  As directed       Discharge Medications   Allergies as of 02/04/2019      Reactions   Dilaudid [hydromorphone Hcl] Other (See Comments)   hallucinations      Medication List    STOP taking these medications   ibuprofen 200 MG tablet Commonly known as:  ADVIL,MOTRIN     TAKE these medications   albuterol 108 (90 Base) MCG/ACT inhaler Commonly known as:  PROVENTIL HFA;VENTOLIN HFA Inhale 2 puffs into the lungs every 6 (six) hours as needed (Wheezing, shortness of breath or cough).   amLODipine 10 MG tablet Commonly known as:  NORVASC Take 1 tablet (10 mg total) by mouth daily.   aspirin 81 MG chewable tablet Chew 81 mg by mouth daily as needed for mild pain.   cephALEXin 250 MG capsule Commonly known as:  KEFLEX Take 1 capsule (250 mg total) by mouth every 12 (twelve) hours.   metoprolol succinate 50 MG 24 hr tablet Commonly known as:  TOPROL-XL TAKE 1 TABLET EACH DAY. What changed:  See the new instructions.   NEXIUM 40 MG capsule Generic drug:  esomeprazole TAKE 1 CAPSULE EVERY DAY. What changed:  how much to take       Follow-up Information    Maple Hudson., MD. Schedule an appointment as soon as possible for a visit in 1 week(s).   Specialty:  Family Medicine Contact information: [address] [place]  [address] [place] [address] [phone]        Tyler Pita, MD. Schedule an appointment as soon as possible for a visit in 3 day(s).   Specialty:  Internal  Medicine Contact information: [address] [place] [address] [phone]           Major procedures and Radiology Reports - PLEASE review detailed and final reports thoroughly  -        Dg Chest 2 View  Result Date: 02/01/2019 CLINICAL DATA:  Cough.  Hypotension EXAM: CHEST - 2 VIEW COMPARISON:  None. FINDINGS: There is mild scarring in the anterior right base. There is no edema or consolidation. Heart size and pulmonary vascularity are normal. No adenopathy. No bone lesions. IMPRESSION: Scarring anterior right base. No edema or consolidation. Heart size within normal limits. Electronically Signed   By: Bretta BangWilliam  Woodruff III M.D.   On: 02/01/2019 11:16   [place] Renal  Result Date: 02/01/2019 CLINICAL DATA:  Acute renal injury EXAM: RENAL / URINARY TRACT ULTRASOUND COMPLETE COMPARISON:  CT 03/16/2016 FINDINGS: Right Kidney: Renal measurements: 8.8 x 4.5 x 5.3 cm = volume: 109 mL . Echogenicity within normal limits. No mass or hydronephrosis visualized. Left Kidney: Renal measurements: 10.3 x 5.9 x 7.2 = volume: 230 mL. Echogenicity within normal limits. No mass or hydronephrosis visualized. Bladder: Appears normal for degree of bladder distention. IMPRESSION: No sonographic findings to explain the patient's acute renal injury. Electronically Signed   By: Tollie Ethavid  Kwon M.D.   On: 02/01/2019 15:20    Micro Results    Recent Results (from the past 240 hour(s))  Urine culture     Status: Abnormal   Collection Time: 02/01/19  3:13 PM  Result Value Ref Range Status   Specimen Description URINE, CLEAN CATCH  Final   Special Requests   Final    NONE Performed at [facility], [address] [place], [place] [address]    Culture >=100,000 COLONIES/mL ESCHERICHIA COLI (A)  Final   Report Status 02/03/2019 FINAL  Final    Organism ID, Bacteria ESCHERICHIA COLI (A)  Final      Susceptibility   Escherichia coli - MIC*    AMPICILLIN <=2 SENSITIVE Sensitive     CEFAZOLIN <=4 SENSITIVE Sensitive     CEFTRIAXONE <=1 SENSITIVE Sensitive     CIPROFLOXACIN <=0.25 SENSITIVE Sensitive     GENTAMICIN <=1 SENSITIVE Sensitive     IMIPENEM <=0.25 SENSITIVE Sensitive     NITROFURANTOIN <=16 SENSITIVE Sensitive     TRIMETH/SULFA <=20 SENSITIVE Sensitive     AMPICILLIN/SULBACTAM <=2 SENSITIVE Sensitive     PIP/TAZO <=4 SENSITIVE Sensitive     Extended ESBL NEGATIVE Sensitive     * >=100,000 COLONIES/mL ESCHERICHIA COLI  MRSA PCR Screening     Status: None   Collection Time: 02/01/19  4:47 PM  Result Value Ref Range Status   MRSA by PCR NEGATIVE NEGATIVE Final    Comment:        The GeneXpert MRSA Assay (FDA approved for NASAL specimens only), is one component of a comprehensive MRSA colonization surveillance program. It is not intended to diagnose MRSA infection nor to guide or monitor treatment for MRSA infections. Performed at [facility], [address] [place], [place] [address]     Today   Subjective    Ana Ryan today has no headache,no chest abdominal pain,no new weakness tingling or numbness, feels much better wants to go home today.    Objective   Blood pressure (!) 163/97, pulse 83, temperature 97.7 F (36.5 C), temperature source Oral, resp. rate 18, height 5\' 2"  (1.575 m), weight 73.5 kg, SpO2 97 %.   Intake/Output Summary (Last  24 hours) at 02/04/2019 0925 Last data filed at 02/03/2019 2053 Gross per 24 hour  Intake -  Output 1100 ml  Net -1100 ml    Exam Awake Alert, Oriented x 3, No new F.N deficits, Normal affect [place].AT,PERRAL Supple Neck,No JVD, No cervical lymphadenopathy appriciated.  Symmetrical Chest wall movement, Good air movement bilaterally, CTAB RRR,No Gallops,Rubs or new Murmurs, No Parasternal Heave +ve B.Sounds, Abd Soft, Non tender, No organomegaly  appriciated, No rebound -guarding or rigidity. No Cyanosis, Clubbing or edema, No new Rash or bruise   Data Review   CBC w Diff:  Lab Results  Component Value Date   WBC 10.0 02/02/2019   HGB 9.4 (L) 02/02/2019   HCT 27.6 (L) 02/02/2019   PLT 222 02/02/2019   LYMPHOPCT 6 02/01/2019   MONOPCT 9 02/01/2019   EOSPCT 0 02/01/2019   BASOPCT 0 02/01/2019    CMP:  Lab Results  Component Value Date   NA 134 (L) 02/04/2019   NA 140 08/20/2013   K 3.0 (L) 02/04/2019   CL 95 (L) 02/04/2019   CO2 28 02/04/2019   BUN 45 (H) 02/04/2019   BUN 5 08/20/2013   CREATININE 3.24 (H) 02/04/2019   GLU 105 08/20/2013   PROT 7.0 02/01/2019   ALBUMIN 2.4 (L) 02/02/2019   BILITOT 0.3 02/01/2019   ALKPHOS 76 02/01/2019   AST 13 (L) 02/01/2019   ALT 13 02/01/2019  .   Total Time in preparing paper work, data evaluation and todays exam - 35 minutes  Susa Raring M.D on 02/04/2019 at 9:25 AM  [facility]   Office  [phone]

## 2019-02-04 NOTE — Discharge Instructions (Signed)
Avoid any NSAID use  Follow with Primary MD Maple Hudson., MD in 2 days   Get CBC, BMP  checked  by Primary MD  in 2 days    Activity: As tolerated with Full fall precautions use walker/cane & assistance as needed  Disposition Home    Diet: Heart Healthy    Special Instructions: If you have smoked or chewed Tobacco  in the last 2 yrs please stop smoking, stop any regular Alcohol  and or any Recreational drug use.  On your next visit with your primary care physician please Get Medicines reviewed and adjusted.  Please request your Prim.MD to go over all Hospital Tests and Procedure/Radiological results at the follow up, please get all Hospital records sent to your Prim MD by signing hospital release before you go home.  If you experience worsening of your admission symptoms, develop shortness of breath, life threatening emergency, suicidal or homicidal thoughts you must seek medical attention immediately by calling 911 or calling your MD immediately  if symptoms less severe.  You Must read complete instructions/literature along with all the possible adverse reactions/side effects for all the Medicines you take and that have been prescribed to you. Take any new Medicines after you have completely understood and accpet all the possible adverse reactions/side effects.

## 2019-02-04 NOTE — Progress Notes (Signed)
Gigi Gin discharged Home per MD order.  Discharge instructions reviewed and discussed with the patient, all questions and concerns answered. Copy of instructions, care notes for new medication and diagnosis and scripts fax to pt. Pharmacy and given to patient.  Allergies as of 02/04/2019      Reactions   Dilaudid [hydromorphone Hcl] Other (See Comments)   hallucinations      Medication List    STOP taking these medications   ibuprofen 200 MG tablet Commonly known as:  ADVIL,MOTRIN     TAKE these medications   albuterol 108 (90 Base) MCG/ACT inhaler Commonly known as:  PROVENTIL HFA;VENTOLIN HFA Inhale 2 puffs into the lungs every 6 (six) hours as needed (Wheezing, shortness of breath or cough).   amLODipine 10 MG tablet Commonly known as:  NORVASC Take 1 tablet (10 mg total) by mouth daily.   aspirin 81 MG chewable tablet Chew 81 mg by mouth daily as needed for mild pain.   cephALEXin 250 MG capsule Commonly known as:  KEFLEX Take 1 capsule (250 mg total) by mouth every 12 (twelve) hours.   metoprolol succinate 50 MG 24 hr tablet Commonly known as:  TOPROL-XL TAKE 1 TABLET EACH DAY. What changed:  See the new instructions.   NEXIUM 40 MG capsule Generic drug:  esomeprazole TAKE 1 CAPSULE EVERY DAY. What changed:  how much to take       IV site discontinued and catheter remains intact. Site without signs and symptoms of complications. Dressing and pressure applied.  Patient escorted to car by this RN in a wheelchair,  no distress noted upon discharge.  Laural Benes, Ediberto Sens C 02/04/2019 5:27 PM

## 2019-02-05 ENCOUNTER — Telehealth: Payer: Self-pay

## 2019-02-05 NOTE — Telephone Encounter (Signed)
Transition Care Management Follow-up Telephone Call  Date of discharge and from where: [facility] on 02/04/19.  How have you been since you were released from the hospital? Doing better, does not feel weak anymore. Declines fever, pain, SOB or n/v/d.  Any questions or concerns? No   Items Reviewed:  Did the pt receive and understand the discharge instructions provided? Yes   Medications obtained and verified? Declines reviewing meds.  Any new allergies since your discharge? No   Dietary orders reviewed? Yes  Do you have support at home? Yes   Other (ie: DME, Home Health, etc) N/A  Functional Questionnaire: (I = Independent and D = Dependent)  Bathing/Dressing- I   Meal Prep- I  Eating- I  Maintaining continence- I  Transferring/Ambulation- I  Managing Meds- I   Follow up appointments reviewed:    PCP Hospital f/u appt confirmed? No, pt is awaiting BW results from Dr Glenna Fellows before scheduling an appointment.    Specialist Hospital f/u appt confirmed? Yes    Are transportation arrangements needed? No   If their condition worsens, is the pt aware to call  their PCP or go to the ED? Yes  Was the patient provided with contact information for the PCP's office or ED? Yes  Was the pt encouraged to call back with questions or concerns? Yes

## 2019-02-05 NOTE — Telephone Encounter (Signed)
No HFU scheduled currently.

## 2019-02-07 DIAGNOSIS — N179 Acute kidney failure, unspecified: Secondary | ICD-10-CM | POA: Diagnosis not present

## 2019-02-13 ENCOUNTER — Ambulatory Visit (INDEPENDENT_AMBULATORY_CARE_PROVIDER_SITE_OTHER): Payer: BLUE CROSS/BLUE SHIELD | Admitting: Family Medicine

## 2019-02-13 ENCOUNTER — Encounter: Payer: Self-pay | Admitting: Family Medicine

## 2019-02-13 VITALS — BP 110/62 | HR 68 | Temp 98.4°F | Resp 16 | Wt 149.0 lb

## 2019-02-13 DIAGNOSIS — N179 Acute kidney failure, unspecified: Secondary | ICD-10-CM

## 2019-02-13 DIAGNOSIS — I10 Essential (primary) hypertension: Secondary | ICD-10-CM

## 2019-02-13 DIAGNOSIS — K2100 Gastro-esophageal reflux disease with esophagitis, without bleeding: Secondary | ICD-10-CM

## 2019-02-13 DIAGNOSIS — F320 Major depressive disorder, single episode, mild: Secondary | ICD-10-CM | POA: Diagnosis not present

## 2019-02-13 DIAGNOSIS — F1010 Alcohol abuse, uncomplicated: Secondary | ICD-10-CM

## 2019-02-13 LAB — POCT URINALYSIS DIPSTICK
Bilirubin, UA: NEGATIVE
Glucose, UA: NEGATIVE
Ketones, UA: NEGATIVE
Nitrite, UA: NEGATIVE
PROTEIN UA: NEGATIVE
Spec Grav, UA: 1.02 (ref 1.010–1.025)
Urobilinogen, UA: 0.2 E.U./dL
pH, UA: 6.5 (ref 5.0–8.0)

## 2019-02-13 NOTE — Progress Notes (Signed)
Patient: Ana Ryan Female    DOB: Aug 31, 1956   63 y.o.   MRN: [id number] Visit Date: 02/13/2019  Today's Provider: [name], MD   Chief Complaint  Patient presents with  . Hospitalization Follow-up   Subjective:     HPI   Follow up Hospitalization  Patient was admitted to [facility] on 02/01/2019 and discharged on 02/04/2019.  This is a transition of care visit. She was treated for acute kidney injury and UTI. Treatment for this included IV fluids antibiotics (Keflex). Telephone follow up was done on 02/05/2019 She reports good compliance with treatment. She reports this condition is Improved. However, patient reports that she no longer takes metoprolol due to low BPs. Patient also mentions that she has seen nephrology since her hospital stay.     Allergies  Allergen Reactions  . Dilaudid [Hydromorphone Hcl] Other (See Comments)    hallucinations     Current Outpatient Medications:  .  albuterol (PROVENTIL HFA;VENTOLIN HFA) 108 (90 Base) MCG/ACT inhaler, Inhale 2 puffs into the lungs every 6 (six) hours as needed (Wheezing, shortness of breath or cough)., Disp: 1 Inhaler, Rfl: 2 .  aspirin 81 MG chewable tablet, Chew 81 mg by mouth daily as needed for mild pain. , Disp: , Rfl:  .  amLODipine (NORVASC) 10 MG tablet, Take 1 tablet (10 mg total) by mouth daily. (Patient not taking: Reported on 02/13/2019), Disp: 30 tablet, Rfl: 11 .  cephALEXin (KEFLEX) 250 MG capsule, Take 1 capsule (250 mg total) by mouth every 12 (twelve) hours. (Patient not taking: Reported on 02/13/2019), Disp: 10 capsule, Rfl: 0 .  metoprolol succinate (TOPROL-XL) 50 MG 24 hr tablet, TAKE 1 TABLET EACH DAY. (Patient not taking: No sig reported), Disp: 90 tablet, Rfl: 3 .  NEXIUM 40 MG capsule, TAKE 1 CAPSULE EVERY DAY. (Patient not taking: No sig reported), Disp: 90 capsule, Rfl: 3  Review of Systems  Constitutional: Positive for fatigue. Negative for activity change, appetite change,  diaphoresis, fever and unexpected weight change.  HENT: Negative.   Eyes: Negative.   Respiratory: Negative for cough and shortness of breath.   Cardiovascular: Negative for chest pain, palpitations and leg swelling.  Gastrointestinal: Negative for abdominal distention, constipation and diarrhea.  Endocrine: Negative.   Genitourinary: Negative for difficulty urinating, flank pain, frequency and urgency.  Allergic/Immunologic: Negative for environmental allergies.  Neurological: Negative for dizziness, light-headedness and headaches.  Hematological: Negative.   Psychiatric/Behavioral: Negative.     Social History   Tobacco Use  . Smoking status: Former Games developer  . Smokeless tobacco: Never Used  . Tobacco comment: did not smoke regularly  Substance Use Topics  . Alcohol use: Yes    Comment: h/o heavy use      Objective:   BP 110/62 (BP Location: Right Arm, Patient Position: Sitting, Cuff Size: Normal)   Pulse 68   Temp 98.4 F (36.9 C)   Resp 16   Wt 149 lb (67.6 kg)   SpO2 100%   BMI 27.25 kg/m  Vitals:   02/13/19 1140  BP: 110/62  Pulse: 68  Resp: 16  Temp: 98.4 F (36.9 C)  SpO2: 100%  Weight: 149 lb (67.6 kg)     Physical Exam Constitutional:      Appearance: She is well-developed.  HENT:     Head: Normocephalic and atraumatic.     Right Ear: External ear normal.     Left Ear: External ear normal.  Nose: Nose normal.  Eyes:     General: No scleral icterus.    Conjunctiva/sclera: Conjunctivae normal.  Neck:     Thyroid: No thyromegaly.  Cardiovascular:     Rate and Rhythm: Normal rate and regular rhythm.     Heart sounds: Normal heart sounds.  Pulmonary:     Effort: Pulmonary effort is normal.     Breath sounds: Normal breath sounds.  Abdominal:     Palpations: Abdomen is soft.  Skin:    General: Skin is warm and dry.  Neurological:     Mental Status: She is alert and oriented to person, place, and time.  Psychiatric:        Behavior:  Behavior normal.        Thought Content: Thought content normal.        Judgment: Judgment normal.         Assessment & Plan    1. AKI (acute kidney injury) (HCC) Secondary to dehydration and possibly sepsis.  She has been followed by nephrology. - Urine Culture - POCT urinalysis dipstick  2. Essential hypertension Off of amlodipine and metoprolol.RTC 1-3 months.  3. Mild major depression (HCC) In partial remission.  4. Esophagitis, reflux Off of Nexium and any nonsteroidals.  5. Alcohol abuse No Drink in a few weeks.    I have done the exam and reviewed the above chart and it is accurate to the best of my knowledge. Dentist has been used in this note in any air is in the dictation or transcription are unintentional.  [name], MD  [facility]

## 2019-02-14 ENCOUNTER — Other Ambulatory Visit (HOSPITAL_COMMUNITY): Payer: Self-pay | Admitting: Internal Medicine

## 2019-02-14 ENCOUNTER — Ambulatory Visit: Payer: BLUE CROSS/BLUE SHIELD | Admitting: Family Medicine

## 2019-02-14 DIAGNOSIS — N179 Acute kidney failure, unspecified: Secondary | ICD-10-CM

## 2019-02-15 LAB — URINE CULTURE

## 2019-02-18 DIAGNOSIS — E876 Hypokalemia: Secondary | ICD-10-CM | POA: Diagnosis not present

## 2019-02-22 ENCOUNTER — Other Ambulatory Visit: Payer: Self-pay | Admitting: Radiology

## 2019-02-25 ENCOUNTER — Other Ambulatory Visit: Payer: Self-pay

## 2019-02-25 ENCOUNTER — Encounter (HOSPITAL_COMMUNITY): Payer: Self-pay | Admitting: Emergency Medicine

## 2019-02-25 ENCOUNTER — Ambulatory Visit (HOSPITAL_COMMUNITY)
Admission: RE | Admit: 2019-02-25 | Discharge: 2019-02-25 | Disposition: A | Discharge status: Home / Self Care | Payer: BLUE CROSS/BLUE SHIELD | Source: Ambulatory Visit | Attending: Internal Medicine | Admitting: Internal Medicine

## 2019-02-25 ENCOUNTER — Emergency Department (HOSPITAL_COMMUNITY): Payer: BLUE CROSS/BLUE SHIELD

## 2019-02-25 ENCOUNTER — Emergency Department (HOSPITAL_COMMUNITY)
Admission: EM | Admit: 2019-02-25 | Discharge: 2019-02-25 | Disposition: A | Discharge status: Home / Self Care | Payer: BLUE CROSS/BLUE SHIELD | Attending: Emergency Medicine | Admitting: Emergency Medicine

## 2019-02-25 DIAGNOSIS — Z96641 Presence of right artificial hip joint: Secondary | ICD-10-CM | POA: Diagnosis not present

## 2019-02-25 DIAGNOSIS — Z87891 Personal history of nicotine dependence: Secondary | ICD-10-CM | POA: Insufficient documentation

## 2019-02-25 DIAGNOSIS — G8918 Other acute postprocedural pain: Secondary | ICD-10-CM | POA: Diagnosis not present

## 2019-02-25 DIAGNOSIS — F1010 Alcohol abuse, uncomplicated: Secondary | ICD-10-CM | POA: Diagnosis not present

## 2019-02-25 DIAGNOSIS — N1330 Unspecified hydronephrosis: Secondary | ICD-10-CM | POA: Diagnosis not present

## 2019-02-25 DIAGNOSIS — I10 Essential (primary) hypertension: Secondary | ICD-10-CM | POA: Insufficient documentation

## 2019-02-25 DIAGNOSIS — F329 Major depressive disorder, single episode, unspecified: Secondary | ICD-10-CM | POA: Insufficient documentation

## 2019-02-25 DIAGNOSIS — R109 Unspecified abdominal pain: Secondary | ICD-10-CM

## 2019-02-25 DIAGNOSIS — N179 Acute kidney failure, unspecified: Secondary | ICD-10-CM

## 2019-02-25 DIAGNOSIS — N19 Unspecified kidney failure: Secondary | ICD-10-CM | POA: Diagnosis not present

## 2019-02-25 DIAGNOSIS — G8928 Other chronic postprocedural pain: Secondary | ICD-10-CM | POA: Diagnosis not present

## 2019-02-25 DIAGNOSIS — F419 Anxiety disorder, unspecified: Secondary | ICD-10-CM | POA: Insufficient documentation

## 2019-02-25 DIAGNOSIS — R1032 Left lower quadrant pain: Secondary | ICD-10-CM | POA: Diagnosis present

## 2019-02-25 LAB — BASIC METABOLIC PANEL
Anion gap: 9 (ref 5–15)
BUN: 31 mg/dL — ABNORMAL HIGH (ref 8–23)
CO2: 16 mmol/L — ABNORMAL LOW (ref 22–32)
Calcium: 9.2 mg/dL (ref 8.9–10.3)
Chloride: 109 mmol/L (ref 98–111)
Creatinine, Ser: 3.01 mg/dL — ABNORMAL HIGH (ref 0.44–1.00)
GFR calc Af Amer: 18 mL/min — ABNORMAL LOW (ref >60)
GFR calc non Af Amer: 16 mL/min — ABNORMAL LOW (ref >60)
Glucose, Bld: 113 mg/dL — ABNORMAL HIGH (ref 70–99)
POTASSIUM: 3.5 mmol/L (ref 3.5–5.1)
Sodium: 134 mmol/L — ABNORMAL LOW (ref 135–145)

## 2019-02-25 LAB — URINALYSIS, ROUTINE W REFLEX MICROSCOPIC
Bilirubin Urine: NEGATIVE
GLUCOSE, UA: NEGATIVE mg/dL
Ketones, ur: 5 mg/dL — AB
Nitrite: NEGATIVE
PROTEIN: 100 mg/dL — AB
RBC / HPF: >50 RBC/hpf — ABNORMAL HIGH (ref 0–5)
Specific Gravity, Urine: 1.01 (ref 1.005–1.030)
pH: 6 (ref 5.0–8.0)

## 2019-02-25 LAB — CBC
HCT: 37.5 % (ref 36.0–46.0)
HCT: 30.5 % — ABNORMAL LOW (ref 36.0–46.0)
Hemoglobin: 11.6 g/dL — ABNORMAL LOW (ref 12.0–15.0)
Hemoglobin: 9.9 g/dL — ABNORMAL LOW (ref 12.0–15.0)
MCH: 30.7 pg (ref 26.0–34.0)
MCH: 30.8 pg (ref 26.0–34.0)
MCHC: 30.9 g/dL (ref 30.0–36.0)
MCHC: 32.5 g/dL (ref 30.0–36.0)
MCV: 99.2 fL (ref 80.0–100.0)
MCV: 95 fL (ref 80.0–100.0)
Platelets: 360 10*3/uL (ref 150–400)
Platelets: 339 10*3/uL (ref 150–400)
RBC: 3.78 MIL/uL — ABNORMAL LOW (ref 3.87–5.11)
RBC: 3.21 MIL/uL — ABNORMAL LOW (ref 3.87–5.11)
RDW: 13.5 % (ref 11.5–15.5)
RDW: 13.2 % (ref 11.5–15.5)
WBC: 5.2 10*3/uL (ref 4.0–10.5)
WBC: 8.4 10*3/uL (ref 4.0–10.5)
nRBC: 0 % (ref 0.0–0.2)
nRBC: 0.2 % (ref 0.0–0.2)

## 2019-02-25 LAB — PROTIME-INR
INR: 0.9 (ref 0.8–1.2)
PROTHROMBIN TIME: 11.9 s (ref 11.4–15.2)

## 2019-02-25 MED ORDER — GELATIN ABSORBABLE 12-7 MM EX MISC
CUTANEOUS | Status: AC
  Filled 2019-02-25: qty 1

## 2019-02-25 MED ORDER — MIDAZOLAM HCL 2 MG/2ML IJ SOLN
INTRAMUSCULAR | Status: AC | PRN
  Administered 2019-02-25 (×2): 1 mg via INTRAVENOUS

## 2019-02-25 MED ORDER — OXYCODONE-ACETAMINOPHEN 5-325 MG PO TABS
1.0000 | ORAL_TABLET | Freq: Once | ORAL | Status: AC
  Administered 2019-02-25: 1 via ORAL
  Filled 2019-02-25: qty 1

## 2019-02-25 MED ORDER — OXYCODONE-ACETAMINOPHEN 5-325 MG PO TABS: 1.0000 | ORAL_TABLET | Freq: Four times a day (QID) | ORAL | 0 refills | Status: DC | PRN

## 2019-02-25 MED ORDER — SODIUM CHLORIDE 0.9 % IV SOLN: INTRAVENOUS | Status: DC

## 2019-02-25 MED ORDER — MIDAZOLAM HCL 2 MG/2ML IJ SOLN
INTRAMUSCULAR | Status: AC
  Filled 2019-02-25: qty 2

## 2019-02-25 MED ORDER — FENTANYL CITRATE (PF) 100 MCG/2ML IJ SOLN
INTRAMUSCULAR | Status: AC
  Filled 2019-02-25: qty 2

## 2019-02-25 MED ORDER — FENTANYL CITRATE (PF) 100 MCG/2ML IJ SOLN
INTRAMUSCULAR | Status: AC | PRN
  Administered 2019-02-25: 50 ug via INTRAVENOUS

## 2019-02-25 MED ORDER — LIDOCAINE HCL (PF) 1 % IJ SOLN
INTRAMUSCULAR | Status: AC
  Filled 2019-02-25: qty 30

## 2019-02-25 NOTE — Progress Notes (Addendum)
Pt with blood and clot in urine, Karie Mainland PA paged. Will monitor a bit longer and keep her updated.   Pt urinated again 20 min later, still bloody but no clot present. PA paged.  Dr. Loreta Ave called. Since pt is pain free, VSS, have pt monitor urine and call with any issues. Pt dressing for D/C.

## 2019-02-25 NOTE — ED Provider Notes (Signed)
[facility] EMERGENCY DEPARTMENT Provider Note   CSN: [id number] Arrival date & time: 02/25/19  1507    History   Chief Complaint Chief Complaint  Patient presents with  . Flank Pain    HPI Ana Ryan is a 63 y.o. female.     HPI Patient had a left-sided renal biopsy done today.  Has had worsening renal function that led to the biopsy.  Had been doing okay with some mild hematuria after it and then developed severe left flank pain.  States she had nausea and some vomiting.  Feeling a little bit better now.  Had a long wait in the waiting room before coming back.  No fevers.  She was on no pain medicines at home. Past Medical History:  Diagnosis Date  . ARF (acute renal failure) (HCC) 01/2019  . GERD (gastroesophageal reflux disease)   . H/O seasonal allergies    uses OTC meds as needed, eyes waters, sneezes  . Hypertension   . Incarcerated incisional hernia 04/13/2016    Patient Active Problem List   Diagnosis Date Noted  . Metabolic acidosis 02/01/2019  . Acute renal failure (ARF) (HCC) 02/01/2019  . Hyperglycemia 02/01/2019  . Alcohol abuse 02/01/2019  . Incarcerated incisional hernia 04/13/2016  . Narrowing of intervertebral disc space 07/06/2015  . Spondylosis 07/06/2015  . Ex-cigarette smoker 07/06/2015  . BP (high blood pressure) 07/06/2015  . Adaptive colitis 07/06/2015  . Mild major depression (HCC) 07/06/2015  . Asymptomatic postmenopausal status 07/06/2015  . Overweight 07/06/2015  . Cannot sleep 06/30/2010  . Anxiety 02/09/2010  . Esophagitis, reflux 03/10/2008  . Hypercholesteremia 03/10/2008    Past Surgical History:  Procedure Laterality Date  . ABDOMINAL HYSTERECTOMY    . APPENDECTOMY    . CESAREAN SECTION     x3  . INCISIONAL HERNIA REPAIR N/A 04/13/2016   Procedure: LAPAROSCOPIC INCISIONAL HERNIA WITH MESH ;  Surgeon: Claud Kelp, MD;  Location: [facility];  Service: General;  Laterality: N/A;  . INSERTION OF MESH N/A  04/13/2016   Procedure: INSERTION OF MES,lysis of adhesions;  Surgeon: Claud Kelp, MD;  Location: [facility]S;  Service: General;  Laterality: N/A;  . TOE SURGERY Right   . TOTAL HIP ARTHROPLASTY Right    9'9357; 2016  . WISDOM TOOTH EXTRACTION       OB History   No obstetric history on file.      Home Medications    Prior to Admission medications   Medication Sig Start Date End Date Taking? Authorizing Provider  famotidine (PEPCID) 20 MG tablet Take 20 mg by mouth daily as needed for heartburn or indigestion.   Yes [provider]  oxyCODONE-acetaminophen (PERCOCET/ROXICET) 5-325 MG tablet Take 1-2 tablets by mouth every 6 (six) hours as needed for severe pain. 02/25/19   Benjiman Core, MD    Family History Family History  Problem Relation Age of Onset  . Hyperlipidemia Mother   . Osteoarthritis Father   . Parkinson's disease Father   . Thyroid disease Son   . Kidney disease Neg Hx     Social History Social History   Tobacco Use  . Smoking status: Former Games developer  . Smokeless tobacco: Never Used  . Tobacco comment: did not smoke regularly  Substance Use Topics  . Alcohol use: Yes    Comment: h/o heavy use  . Drug use: No     Allergies   Dilaudid [hydromorphone hcl]   Review of Systems Review of Systems  Constitutional: Negative  for appetite change.  HENT: Negative for congestion.   Respiratory: Negative for shortness of breath.   Cardiovascular: Negative for chest pain.  Gastrointestinal: Positive for nausea and vomiting.  Genitourinary: Positive for flank pain and hematuria.  Musculoskeletal: Negative for back pain.  Skin: Negative for rash.  Neurological: Negative for weakness.  Psychiatric/Behavioral: Negative for confusion.     Physical Exam Updated Vital Signs BP 124/84 (BP Location: Right Arm)   Pulse (!) 130   Temp 98.6 F (37 C) (Oral)   Resp 15   SpO2 100%   Physical Exam HENT:     Head: Normocephalic.  Neck:      Musculoskeletal: Neck supple.  Cardiovascular:     Rate and Rhythm: Normal rate.  Pulmonary:     Effort: Pulmonary effort is normal.  Abdominal:     Tenderness: There is no abdominal tenderness.  Genitourinary:    Comments: Dressing over left CVA area.  Slight area of dried blood.  No clear CVA tenderness. Musculoskeletal: Normal range of motion.  Skin:    Capillary Refill: Capillary refill takes less than 2 seconds.  Neurological:     General: No focal deficit present.     Mental Status: She is alert.      ED Treatments / Results  Labs (all labs ordered are listed, but only abnormal results are displayed) Labs Reviewed  URINALYSIS, ROUTINE W REFLEX MICROSCOPIC - Abnormal; Notable for the following components:      Result Value   Color, Urine RED (*)    APPearance CLOUDY (*)    Hgb urine dipstick MODERATE (*)    Ketones, ur 5 (*)    Protein, ur 100 (*)    Leukocytes,Ua TRACE (*)    RBC / HPF >50 (*)    Bacteria, UA RARE (*)    All other components within normal limits  BASIC METABOLIC PANEL - Abnormal; Notable for the following components:   Sodium 134 (*)    CO2 16 (*)    Glucose, Bld 113 (*)    BUN 31 (*)    Creatinine, Ser 3.01 (*)    GFR calc non Af Amer 16 (*)    GFR calc Af Amer 18 (*)    All other components within normal limits  CBC - Abnormal; Notable for the following components:   RBC 3.21 (*)    Hemoglobin 9.9 (*)    HCT 30.5 (*)    All other components within normal limits    EKG None  Radiology Ct Abdomen Pelvis Wo Contrast  Result Date: 02/25/2019 CLINICAL DATA:  LEFT flank pain after renal biopsy done this morning. Nausea and vomiting. History of hernia repair, hysterectomy, appendectomy. EXAM: CT ABDOMEN AND PELVIS WITHOUT CONTRAST TECHNIQUE: Multidetector CT imaging of the abdomen and pelvis was performed following the standard protocol without IV contrast. COMPARISON:  CT abdomen and pelvis March 16, 2016 FINDINGS: LOWER CHEST: Lung bases are  clear. The visualized heart size is normal. No pericardial effusion. HEPATOBILIARY: Normal. PANCREAS: Normal. SPLEEN: Normal. ADRENALS/URINARY TRACT: Kidneys are orthotopic, demonstrating normal size. New lobular contour RIGHT upper pole concerning for renal mass. Mild LEFT hydronephrosis with dense presumed blood products and proximal ureter para a few bubbles of intra or pararenal postprocedural gas. Small volume LEFT perinephric dense free fluid. Limited assessment for renal masses by nonenhanced CT. The unopacified ureters are normal in course and caliber. Urinary bladder is well distended with single bubble of presumably postprocedural gas. STOMACH/BOWEL: The stomach, small and large bowel  are normal in course and caliber without inflammatory changes, sensitivity decreased by lack of enteric contrast. VASCULAR/LYMPHATIC: Aortoiliac vessels are normal in course and caliber. No lymphadenopathy by CT size criteria. REPRODUCTIVE: Status post hysterectomy. OTHER: No intraperitoneal free fluid or free air. 14 mm fatty mass of punctate calcifications in LEFT pelvis most compatible with old omental infarct or/epiploic appendagitis. MUSCULOSKELETAL: Non-acute. Streak artifact from bilateral hip arthroplasties. New 13 mm midline nodule within the upper abdominal subcutaneous fat. Recommend direct inspection. Mild rectus abdominus diastasis, status post umbilical herniorrhaphy. Severe L5-S1 degenerative disc. IMPRESSION: 1. Small volume LEFT perinephric hemorrhage and dense presumably ureteral blood. Status post LEFT renal biopsy today. 2. **An incidental finding of potential clinical significance has been found. New suspected RIGHT upper pole renal mass. Recommend CT or MRI renal mass protocol on a NONEMERGENT basis if not recently performed. This recommendation follows ACR consensus guidelines: Management of the Incidental Renal Mass on CT: A White Paper of the ACR Incidental Findings Committee. J Am Coll Radiol  [phone]. ** Electronically Signed   By: Awilda Metro M.D.   On: 02/25/2019 22:25   US Biopsy (kidney)  Result Date: 02/25/2019 INDICATION: 63 year old female with a history of renal failure EXAM: IMAGE GUIDED MEDICAL RENAL BIOPSY MEDICATIONS: None. ANESTHESIA/SEDATION: Moderate (conscious) sedation was employed during this procedure. A total of Versed 2.0 mg and Fentanyl 50 mcg was administered intravenously. Moderate Sedation Time: 11 minutes. The patient's level of consciousness and vital signs were monitored continuously by radiology nursing throughout the procedure under my direct supervision. FLUOROSCOPY TIME:  None COMPLICATIONS: None PROCEDURE: Informed written consent was obtained from the patient after a thorough discussion of the procedural risks, benefits and alternatives. All questions were addressed. Maximal Sterile Barrier Technique was utilized including caps, mask, sterile gowns, sterile gloves, sterile drape, hand hygiene and skin antiseptic. A timeout was performed prior to the initiation of the procedure. Patient was positioned prone position on the gantry table. Images were stored sent to PACs. Once the patient is prepped and draped in the usual sterile fashion, the skin and subcutaneous tissues overlying the left kidney were generously infiltrated 1% lidocaine for local anesthesia. Using ultrasound guidance, a 15 gauge guide needle was advanced into the lower cortex of the left kidney. Once we confirmed location of the needle tip, 3 separate 16 gauge core biopsy were achieved. Two Gel-Foam pledgets were infused with a small amount of saline. The needle was removed. Final images were stored. The patient tolerated the procedure well and remained hemodynamically stable throughout. No complications were encountered and no significant blood loss encountered. IMPRESSION: Status post ultrasound-guided medical renal biopsy of left kidney Signed, Yvone Neu. Reyne Dumas, RPVI [facility] Electronically Signed   By: Gilmer Mor D.O.   On: 02/25/2019 10:55    Procedures Procedures (including critical care time)  Medications Ordered in ED Medications  oxyCODONE-acetaminophen (PERCOCET/ROXICET) 5-325 MG per tablet 1 tablet (1 tablet Oral Given 02/25/19 2150)     Initial Impression / Assessment and Plan / ED Course  I have reviewed the triage vital signs and the nursing notes.  Pertinent labs & imaging results that were available during my care of the patient were reviewed by me and considered in my medical decision making (see chart for details).        Patient with left flank pain post renal biopsy.  I think pain likely related to hydronephrosis from the blood.  Labs reassuring.  Hemoglobin reassuring.  Only  small bleed.  Feels much better after treatment.  Heart rate down on my exam.  Discharge home.  Discussed with Dr. Ardelle Anton, who had done the procedure.  Also had potential mass on contralateral kidney that will need outpatient follow-up and patient was informed of this.  Final Clinical Impressions(s) / ED Diagnoses   Final diagnoses:  Flank pain  Post procedure discomfort    ED Discharge Orders         Ordered    oxyCODONE-acetaminophen (PERCOCET/ROXICET) 5-325 MG tablet  Every 6 hours PRN     02/25/19 2256           Benjiman Core, MD 02/25/19 2302

## 2019-02-25 NOTE — Discharge Instructions (Addendum)
Try and keep yourself hydrated.  There was something on your right kidney that will need to be followed.  Your doctors can follow it or follow with urology.

## 2019-02-25 NOTE — Procedures (Signed)
Interventional Radiology Procedure Note  Procedure:  US guided medical renal biopsy, left kidney.  Complications: None Recommendations:  - Ok to shower tomorrow - Do not submerge for 7 days - Routine care   Signed,  Yvone Neu. Loreta Ave, DO

## 2019-02-25 NOTE — H&P (Signed)
Chief Complaint: Patient was seen in consultation today for No chief complaint on file.  at the request of Kruska,Lindsay A  Referring Physician(s): Tyler Pita  Supervising Physician: Gilmer Mor  Patient Status: [facility] - Out-pt  History of Present Illness: Ana Ryan is a 63 y.o. female being worked up for AKI. She is referred for random renal biopsy PMHx, meds, labs, imaging, allergies reviewed. Feels well, no recent fevers, chills, illness. Has been NPO today as directed. Family at bedside.   Past Medical History:  Diagnosis Date  . ARF (acute renal failure) (HCC) 01/2019  . GERD (gastroesophageal reflux disease)   . H/O seasonal allergies    uses OTC meds as needed, eyes waters, sneezes  . Hypertension   . Incarcerated incisional hernia 04/13/2016    Past Surgical History:  Procedure Laterality Date  . ABDOMINAL HYSTERECTOMY    . APPENDECTOMY    . CESAREAN SECTION     x3  . INCISIONAL HERNIA REPAIR N/A 04/13/2016   Procedure: LAPAROSCOPIC INCISIONAL HERNIA WITH MESH ;  Surgeon: Claud Kelp, MD;  Location: [facility];  Service: General;  Laterality: N/A;  . INSERTION OF MESH N/A 04/13/2016   Procedure: INSERTION OF MES,lysis of adhesions;  Surgeon: Claud Kelp, MD;  Location: [facility];  Service: General;  Laterality: N/A;  . TOE SURGERY Right   . TOTAL HIP ARTHROPLASTY Right    5'8099; 2016  . WISDOM TOOTH EXTRACTION      Allergies: Dilaudid [hydromorphone hcl]  Medications: Prior to Admission medications   Medication Sig Start Date End Date Taking? Authorizing Provider  famotidine (PEPCID) 20 MG tablet Take 20 mg by mouth daily as needed for heartburn or indigestion.   Yes [provider]  albuterol (PROVENTIL HFA;VENTOLIN HFA) 108 (90 Base) MCG/ACT inhaler Inhale 2 puffs into the lungs every 6 (six) hours as needed (Wheezing, shortness of breath or cough). 01/10/18   Maple Hudson., MD  amLODipine (NORVASC) 10 MG tablet Take 1  tablet (10 mg total) by mouth daily. Patient not taking: Reported on 02/13/2019 02/04/19 02/04/20  Leroy Sea, MD  cephALEXin (KEFLEX) 250 MG capsule Take 1 capsule (250 mg total) by mouth every 12 (twelve) hours. Patient not taking: Reported on 02/13/2019 02/04/19   Leroy Sea, MD  metoprolol succinate (TOPROL-XL) 50 MG 24 hr tablet TAKE 1 TABLET EACH DAY. Patient not taking: No sig reported 01/13/19   Maple Hudson., MD  NEXIUM 40 MG capsule TAKE 1 CAPSULE EVERY DAY. Patient not taking: No sig reported 12/01/18   Maple Hudson., MD     Family History  Problem Relation Age of Onset  . Hyperlipidemia Mother   . Osteoarthritis Father   . Parkinson's disease Father   . Thyroid disease Son   . Kidney disease Neg Hx     Social History   Socioeconomic History  . Marital status: Married    Spouse name: Not on file  . Number of children: Not on file  . Years of education: Not on file  . Highest education level: Not on file  Occupational History  . Not on file  Social Needs  . Financial resource strain: Not on file  . Food insecurity:    Worry: Not on file    Inability: Not on file  . Transportation needs:    Medical: Not on file    Non-medical: Not on file  Tobacco Use  . Smoking status: Former Games developer  . Smokeless tobacco:  Never Used  . Tobacco comment: did not smoke regularly  Substance and Sexual Activity  . Alcohol use: Yes    Comment: h/o heavy use  . Drug use: No  . Sexual activity: Not on file  Lifestyle  . Physical activity:    Days per week: Not on file    Minutes per session: Not on file  . Stress: Not on file  Relationships  . Social connections:    Talks on phone: Not on file    Gets together: Not on file    Attends religious service: Not on file    Active member of club or organization: Not on file    Attends meetings of clubs or organizations: Not on file    Relationship status: Not on file  Other Topics Concern  . Not on file    Social History Narrative  . Not on file    Review of Systems: A 12 point ROS discussed and pertinent positives are indicated in the HPI above.  All other systems are negative.  Review of Systems  Vital Signs: BP (!) 141/95   Pulse 89   Temp 98.1 F (36.7 C) (Oral)   Resp 18   Ht  (1.575 m)   Wt 65.8 kg   SpO2 100%   BMI 26.52 kg/m   Physical Exam Constitutional:      Appearance: Normal appearance.  HENT:     Mouth/Throat:     Mouth: Mucous membranes are moist.     Pharynx: Oropharynx is clear.  Cardiovascular:     Rate and Rhythm: Normal rate and regular rhythm.     Heart sounds: Normal heart sounds.  Pulmonary:     Effort: Pulmonary effort is normal. No respiratory distress.     Breath sounds: Normal breath sounds.  Abdominal:     General: Abdomen is flat. There is no distension.     Tenderness: There is no abdominal tenderness.  Skin:    General: Skin is warm and dry.  Neurological:     General: No focal deficit present.     Mental Status: She is alert and oriented to person, place, and time.  Psychiatric:        Mood and Affect: Mood normal.        Judgment: Judgment normal.      Imaging: Dg Chest 2 View  Result Date: 02/01/2019 CLINICAL DATA:  Cough.  Hypotension EXAM: CHEST - 2 VIEW COMPARISON:  None. FINDINGS: There is mild scarring in the anterior right base. There is no edema or consolidation. Heart size and pulmonary vascularity are normal. No adenopathy. No bone lesions. IMPRESSION: Scarring anterior right base. No edema or consolidation. Heart size within normal limits. Electronically Signed   By: Bretta Bang III M.D.   On: 02/01/2019 11:16   US Renal  Result Date: 02/01/2019 CLINICAL DATA:  Acute renal injury EXAM: RENAL / URINARY TRACT ULTRASOUND COMPLETE COMPARISON:  CT 03/16/2016 FINDINGS: Right Kidney: Renal measurements: 8.8 x 4.5 x 5.3 cm = volume: 109 mL . Echogenicity within normal limits. No mass or hydronephrosis visualized.  Left Kidney: Renal measurements: 10.3 x 5.9 x 7.2 = volume: 230 mL. Echogenicity within normal limits. No mass or hydronephrosis visualized. Bladder: Appears normal for degree of bladder distention. IMPRESSION: No sonographic findings to explain the patient's acute renal injury. Electronically Signed   By: Tollie Eth M.D.   On: 02/01/2019 15:20    Labs:  CBC: Recent Labs    02/01/19 1139 02/01/19  1225 02/02/19 0810 02/25/19 0700  WBC 14.1*  --  10.0 5.2  HGB 10.9* 11.9* 9.4* 11.6*  HCT 35.1* 35.0* 27.6* 37.5  PLT 220  --  222 360    COAGS: Recent Labs    02/01/19 1035 02/25/19 0700  INR 1.03 0.9    BMP: Recent Labs    02/02/19 0439 02/02/19 1638 02/03/19 0538 02/03/19 1501 02/04/19 0513  NA 131* 132* 133*  --  134*  K 2.4* 3.6 2.7* 3.7 3.0*  CL 101 99 96*  --  95*  CO2 16* 20* 26  --  28  GLUCOSE 105* 106* 105*  --  116*  BUN 66* 62* 56*  --  45*  CALCIUM 8.0* 8.1* 7.8*  --  8.1*  CREATININE 4.82* 4.22* 3.90*  --  3.24*  GFRNONAA 9* 10* 11*  --  14*  GFRAA 10* 12* 13*  --  17*    LIVER FUNCTION TESTS: Recent Labs    02/01/19 1035 02/02/19 0439  BILITOT 0.3  --   AST 13*  --   ALT 13  --   ALKPHOS 76  --   PROT 7.0  --   ALBUMIN 3.0* 2.4*    TUMOR MARKERS: No results for input(s): AFPTM, CEA, CA199, CHROMGRNA in the last 8760 hours.  Assessment and Plan: AKI For US guided random renal biopsy Labs ok Risks and benefits of renal biopsy was discussed with the patient and/or patient's family including, but not limited to bleeding, infection, damage to adjacent structures or low yield requiring additional tests.  All of the questions were answered and there is agreement to proceed.  Consent signed and in chart.    Thank you for this interesting consult.  I greatly enjoyed meeting Ana Ryan and look forward to participating in their care.  A copy of this report was sent to the requesting provider on this date.  Electronically Signed: Brayton El, PA-C 02/25/2019, 8:10 AM   I spent a total of 20 minutes in face to face in clinical consultation, greater than 50% of which was counseling/coordinating care for renal biopsy

## 2019-02-25 NOTE — ED Triage Notes (Signed)
Pt here with right flank pain and hematuria. Reports she had a kidney biopsy this morning, everything was normal, woke up from a nap with "excruciating pain", urine initially pink tinged, and later became "pure blood". Advised to come to ED by doctor.

## 2019-02-25 NOTE — Discharge Instructions (Signed)
Percutaneous Kidney Biopsy, Care After This sheet gives you information about how to care for yourself after your procedure. Your health care provider may also give you more specific instructions. If you have problems or questions, contact your health care provider. What can I expect after the procedure? After the procedure, it is common to have:  Pain or soreness near the area where the needle went through your skin (biopsy site).  Bright pink or cloudy urine for 24 hours after the procedure. Follow these instructions at home: Activity  Return to your normal activities as told by your health care provider. Ask your health care provider what activities are safe for you.  Do not drive for 24 hours if you were given a medicine to help you relax (sedative).  Remove bandaid in 24 hours. Then you may shower.  Do not lift anything that is heavier than 10 lb (4.5 kg) until your health care provider tells you that it is safe.  Avoid activities that take a lot of effort (are strenuous) until your health care provider approves. Most people will have to wait 2 weeks before returning to activities such as exercise or sexual intercourse. General instructions   Take over-the-counter and prescription medicines only as told by your health care provider.  You may eat and drink after your procedure. Follow instructions from your health care provider about eating or drinking restrictions.  Check your biopsy site every day for signs of infection. Check for: ? More redness, swelling, or pain. ? More fluid or blood. ? Warmth. ? Pus or a bad smell.  Keep all follow-up visits as told by your health care provider. This is important. Contact a health care provider if:  You have more redness, swelling, or pain around your biopsy site.  You have more fluid or blood coming from your biopsy site.  Your biopsy site feels warm to the touch.  You have pus or a bad smell coming from your biopsy site.  You  have blood in your urine more than 24 hours after your procedure. Get help right away if:  You have dark red or brown urine.  You have a fever.  You are unable to urinate.  You feel burning when you urinate.  You feel faint.  You have severe pain in your abdomen or side. This information is not intended to replace advice given to you by your health care provider. Make sure you discuss any questions you have with your health care provider. Document Released: 08/07/2013 Document Revised: 09/16/2016 Document Reviewed: 09/16/2016 Elsevier Interactive Patient Education  2019 Elsevier Inc.  Moderate Conscious Sedation, Adult, Care After These instructions provide you with information about caring for yourself after your procedure. Your health care provider may also give you more specific instructions. Your treatment has been planned according to current medical practices, but problems sometimes occur. Call your health care provider if you have any problems or questions after your procedure. What can I expect after the procedure? After your procedure, it is common:  To feel sleepy for several hours.  To feel clumsy and have poor balance for several hours.  To have poor judgment for several hours.  To vomit if you eat too soon. Follow these instructions at home: For at least 24 hours after the procedure:   Do not: ? Participate in activities where you could fall or become injured. ? Drive. ? Use heavy machinery. ? Drink alcohol. ? Take sleeping pills or medicines that cause drowsiness. ? Make important  decisions or sign legal documents. ? Take care of children on your own.  Rest. Eating and drinking  Follow the diet recommended by your health care provider.  If you vomit: ? Drink water, juice, or soup when you can drink without vomiting. ? Make sure you have little or no nausea before eating solid foods. General instructions  Have a responsible adult stay with you until  you are awake and alert.  Take over-the-counter and prescription medicines only as told by your health care provider.  If you smoke, do not smoke without supervision.  Keep all follow-up visits as told by your health care provider. This is important. Contact a health care provider if:  You keep feeling nauseous or you keep vomiting.  You feel light-headed.  You develop a rash.  You have a fever. Get help right away if:  You have trouble breathing. This information is not intended to replace advice given to you by your health care provider. Make sure you discuss any questions you have with your health care provider. Document Released: 09/25/2013 Document Revised: 05/09/2016 Document Reviewed: 03/26/2016 Elsevier Interactive Patient Education  2019 [employer].

## 2019-02-25 NOTE — ED Notes (Signed)
Patient transported to CT scan . 

## 2019-03-12 ENCOUNTER — Encounter: Payer: Self-pay | Admitting: Internal Medicine

## 2019-03-12 DIAGNOSIS — N12 Tubulo-interstitial nephritis, not specified as acute or chronic: Secondary | ICD-10-CM | POA: Insufficient documentation

## 2019-03-13 DIAGNOSIS — N390 Urinary tract infection, site not specified: Secondary | ICD-10-CM | POA: Diagnosis not present

## 2019-03-13 DIAGNOSIS — N10 Acute tubulo-interstitial nephritis: Secondary | ICD-10-CM | POA: Diagnosis not present

## 2019-03-13 DIAGNOSIS — N179 Acute kidney failure, unspecified: Secondary | ICD-10-CM | POA: Diagnosis not present

## 2019-03-14 ENCOUNTER — Encounter (HOSPITAL_COMMUNITY): Payer: Self-pay

## 2019-03-20 DIAGNOSIS — N179 Acute kidney failure, unspecified: Secondary | ICD-10-CM | POA: Diagnosis not present

## 2019-03-20 DIAGNOSIS — N10 Acute tubulo-interstitial nephritis: Secondary | ICD-10-CM | POA: Diagnosis not present

## 2019-03-20 DIAGNOSIS — E875 Hyperkalemia: Secondary | ICD-10-CM | POA: Diagnosis not present

## 2019-03-20 DIAGNOSIS — I129 Hypertensive chronic kidney disease with stage 1 through stage 4 chronic kidney disease, or unspecified chronic kidney disease: Secondary | ICD-10-CM | POA: Diagnosis not present

## 2019-04-12 DIAGNOSIS — N390 Urinary tract infection, site not specified: Secondary | ICD-10-CM | POA: Diagnosis not present

## 2019-04-12 DIAGNOSIS — N179 Acute kidney failure, unspecified: Secondary | ICD-10-CM | POA: Diagnosis not present

## 2019-04-19 DIAGNOSIS — N179 Acute kidney failure, unspecified: Secondary | ICD-10-CM | POA: Diagnosis not present

## 2019-04-19 DIAGNOSIS — I129 Hypertensive chronic kidney disease with stage 1 through stage 4 chronic kidney disease, or unspecified chronic kidney disease: Secondary | ICD-10-CM | POA: Diagnosis not present

## 2019-04-19 DIAGNOSIS — E875 Hyperkalemia: Secondary | ICD-10-CM | POA: Diagnosis not present

## 2019-04-19 DIAGNOSIS — N10 Acute tubulo-interstitial nephritis: Secondary | ICD-10-CM | POA: Diagnosis not present

## 2019-05-03 DIAGNOSIS — N179 Acute kidney failure, unspecified: Secondary | ICD-10-CM | POA: Diagnosis not present

## 2019-05-27 DIAGNOSIS — N10 Acute tubulo-interstitial nephritis: Secondary | ICD-10-CM | POA: Diagnosis not present

## 2019-05-27 DIAGNOSIS — N179 Acute kidney failure, unspecified: Secondary | ICD-10-CM | POA: Diagnosis not present

## 2019-05-27 DIAGNOSIS — I129 Hypertensive chronic kidney disease with stage 1 through stage 4 chronic kidney disease, or unspecified chronic kidney disease: Secondary | ICD-10-CM | POA: Diagnosis not present

## 2019-05-27 DIAGNOSIS — D649 Anemia, unspecified: Secondary | ICD-10-CM | POA: Diagnosis not present

## 2019-05-28 DIAGNOSIS — N390 Urinary tract infection, site not specified: Secondary | ICD-10-CM | POA: Diagnosis not present

## 2019-05-29 ENCOUNTER — Ambulatory Visit
Admission: RE | Admit: 2019-05-29 | Discharge: 2019-05-29 | Disposition: A | Discharge status: Home / Self Care | Payer: BLUE CROSS/BLUE SHIELD | Source: Ambulatory Visit | Attending: Internal Medicine | Admitting: Internal Medicine

## 2019-05-29 ENCOUNTER — Other Ambulatory Visit: Payer: Self-pay | Admitting: Internal Medicine

## 2019-05-29 DIAGNOSIS — N2889 Other specified disorders of kidney and ureter: Secondary | ICD-10-CM

## 2019-05-29 DIAGNOSIS — N179 Acute kidney failure, unspecified: Secondary | ICD-10-CM | POA: Diagnosis not present

## 2019-05-31 DIAGNOSIS — N201 Calculus of ureter: Secondary | ICD-10-CM | POA: Diagnosis not present

## 2019-06-10 ENCOUNTER — Ambulatory Visit: Payer: Self-pay | Admitting: Family Medicine

## 2019-09-30 ENCOUNTER — Other Ambulatory Visit: Payer: Self-pay | Admitting: *Deleted

## 2019-09-30 MED ORDER — METOPROLOL SUCCINATE ER 50 MG PO TB24: ORAL_TABLET | ORAL | 3 refills | Status: DC

## 2019-09-30 NOTE — Telephone Encounter (Signed)
Pharmacy requesting refills. Thanks!  

## 2019-10-09 IMAGING — US US RENAL
1 series · 14 of 25 positions shown · non-contrast
Comparison: CT 03/16/2016

CLINICAL DATA: Acute renal injury

EXAM:
RENAL / URINARY TRACT ULTRASOUND COMPLETE

[Series 1: us renal · 14 of 41 slices shown]
[im 1/41]
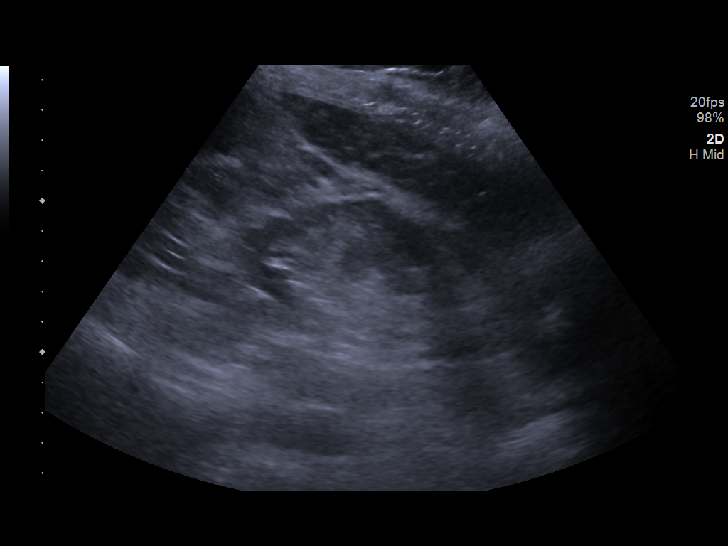
[im 4/41]
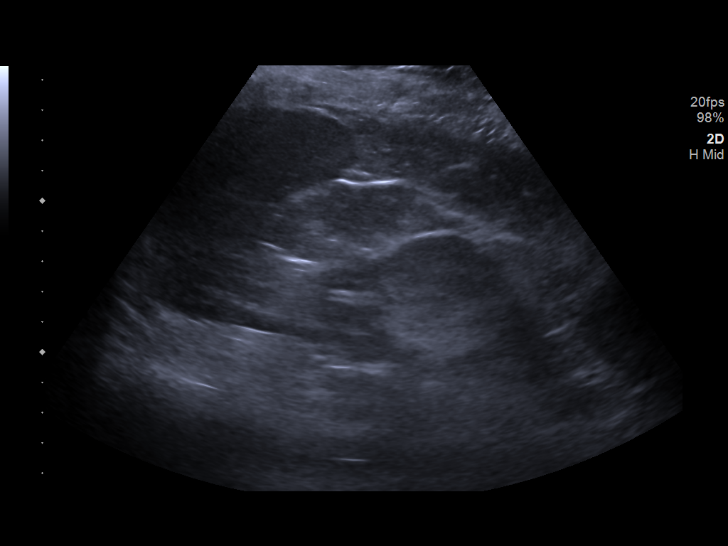
[im 7/41]
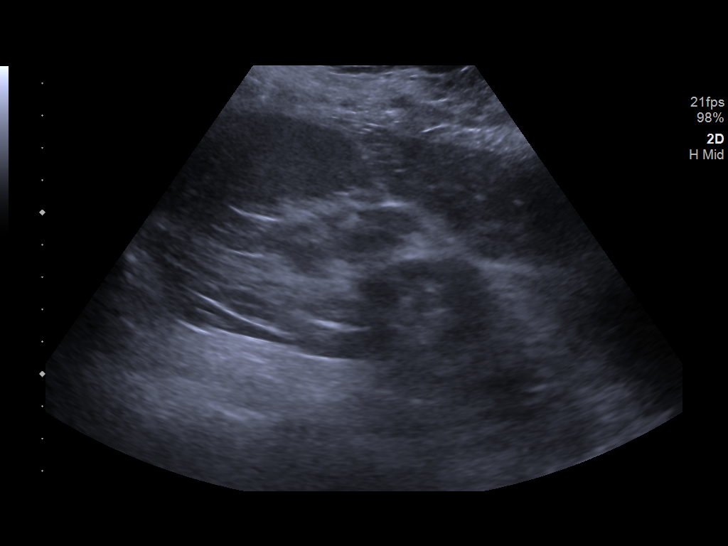
[im 11/41]
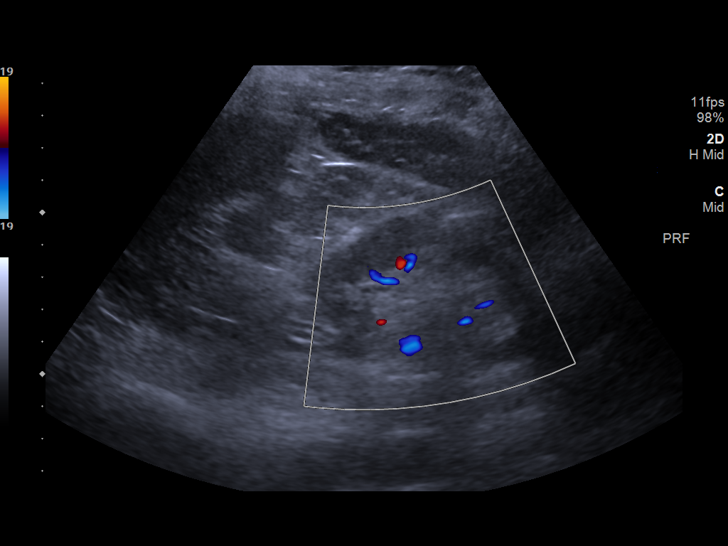
[im 14/41]
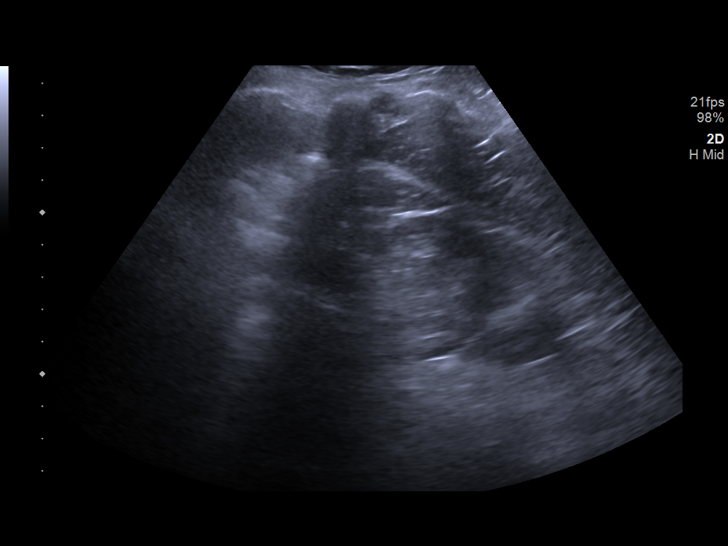
[im 16/41]
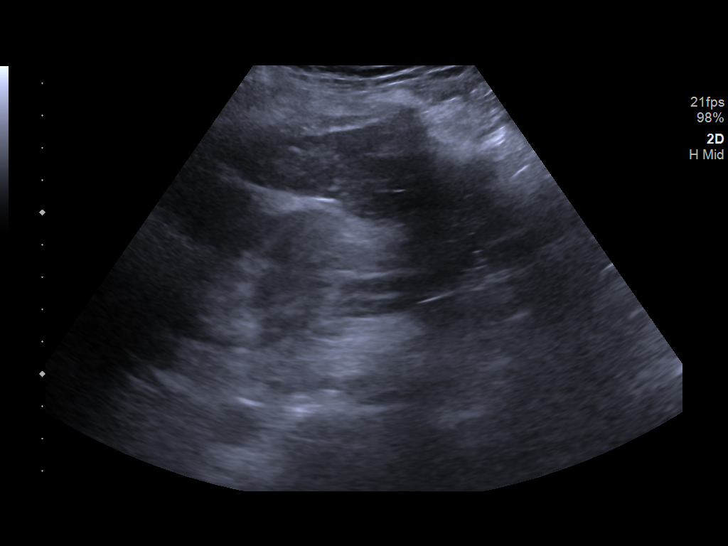
[im 19/41]
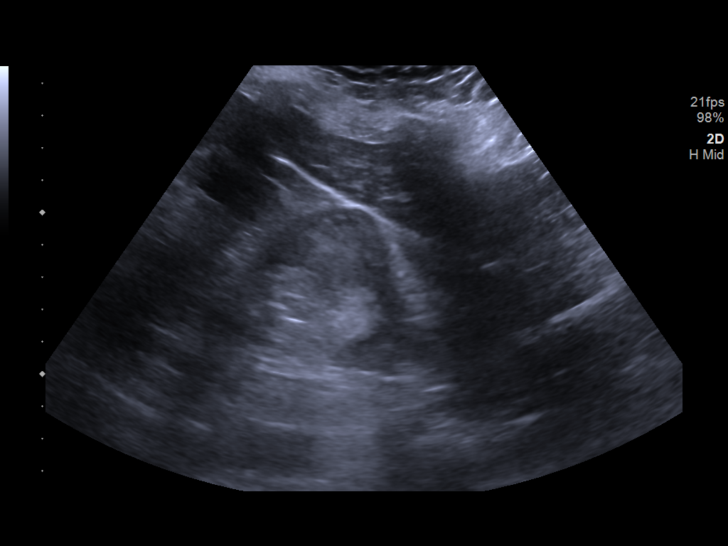
[im 22/41]
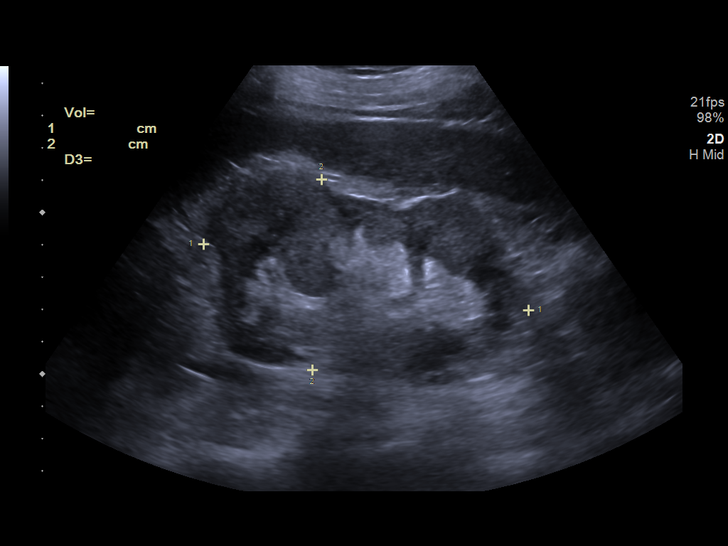
[im 26/41]
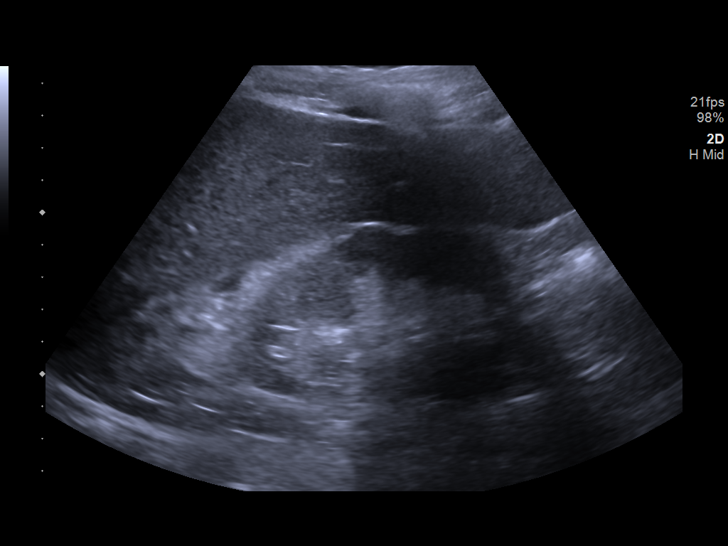
[im 27/41]
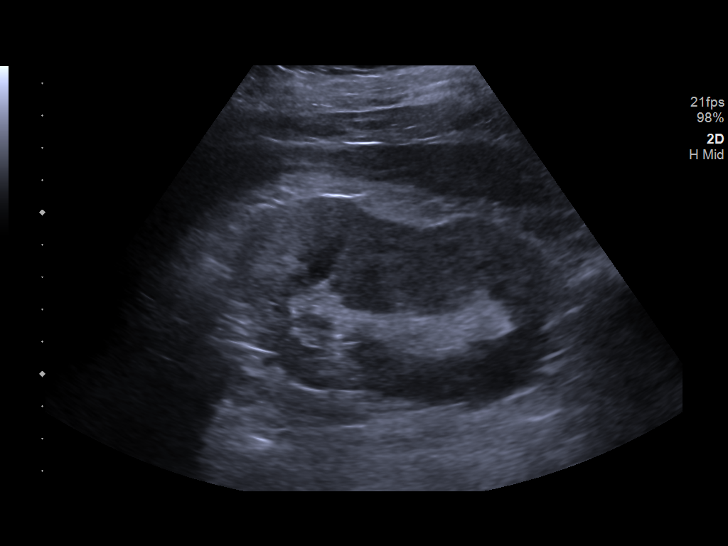
[im 31/41]
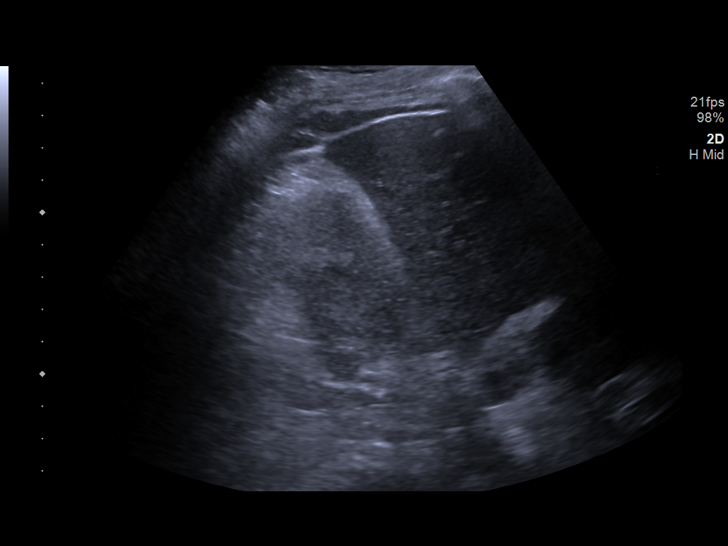
[im 34/41]
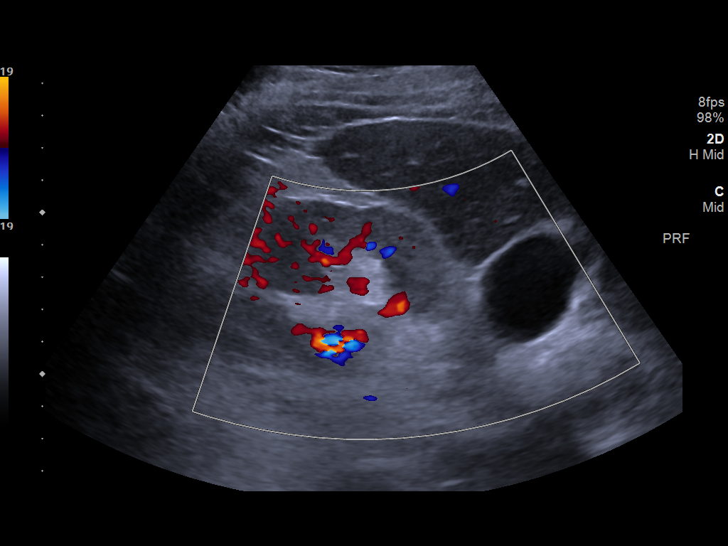
[im 37/41]
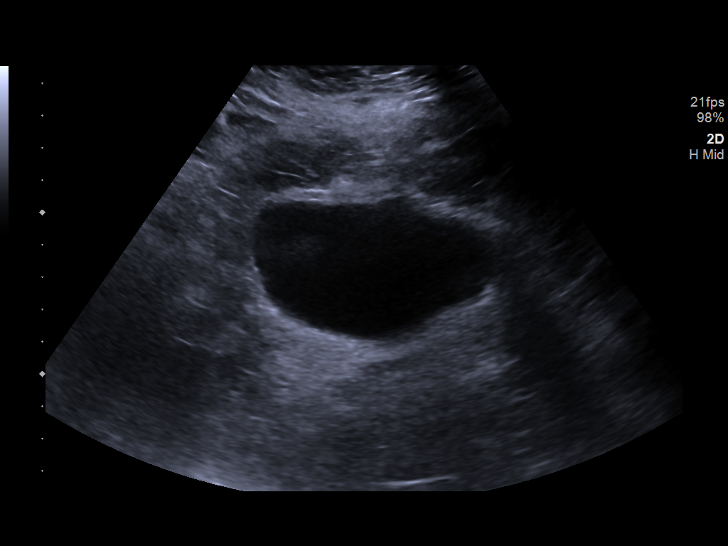
[im 41/41]
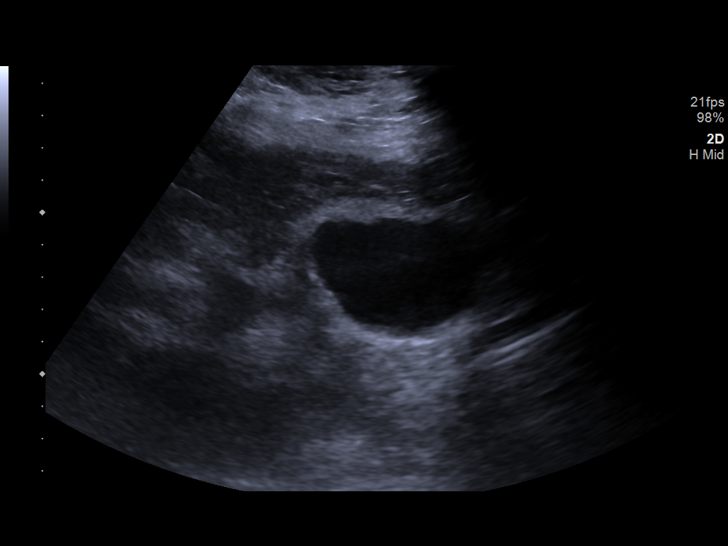

[14 of 25 positions shown; findings below may reference images not displayed]

FINDINGS: Right Kidney:

Renal measurements: 8.8 x 4.5 x 5.3 cm = volume: 109 mL .
Echogenicity within normal limits. No mass or hydronephrosis
visualized.

Left Kidney:

Renal measurements: 10.3 x 5.9 x 7.2 = volume: 230 mL. Echogenicity
within normal limits. No mass or hydronephrosis visualized.

Bladder:

Appears normal for degree of bladder distention.
IMPRESSION: No sonographic findings to explain the patient's acute renal injury.

## 2020-01-10 ENCOUNTER — Ambulatory Visit: Payer: BLUE CROSS/BLUE SHIELD | Attending: Internal Medicine

## 2020-01-10 DIAGNOSIS — Z23 Encounter for immunization: Secondary | ICD-10-CM | POA: Insufficient documentation

## 2020-01-10 NOTE — Progress Notes (Signed)
   Covid-19 Vaccination Clinic  Name:  Ana Ryan    MRN: [id number] DOB: Aug 26, 1956  01/10/2020  [name] was observed post Covid-19 immunization for 15 minutes without incidence. She was provided with Vaccine Information Sheet and instruction to access the V-Safe system.   [name] was instructed to call 911 with any severe reactions post vaccine: Marland Kitchen Difficulty breathing  . Swelling of your face and throat  . A fast heartbeat  . A bad rash all over your body  . Dizziness and weakness    Immunizations Administered    Name Date Dose VIS Date Route   Pfizer COVID-19 Vaccine 01/10/2020  1:59 PM 0.3 mL 11/29/2019 Intramuscular   Manufacturer: [employer], [employer]   Lot: [id number]   NDC: [address]-2

## 2020-01-31 ENCOUNTER — Ambulatory Visit: Payer: BLUE CROSS/BLUE SHIELD | Attending: Internal Medicine

## 2020-01-31 DIAGNOSIS — Z23 Encounter for immunization: Secondary | ICD-10-CM | POA: Insufficient documentation

## 2020-01-31 NOTE — Progress Notes (Signed)
   Covid-19 Vaccination Clinic  Name:  Ana Ryan    MRN: [id number] DOB: 1956-04-13  01/31/2020  [name] was observed post Covid-19 immunization for 15 minutes without incidence. She was provided with Vaccine Information Sheet and instruction to access the V-Safe system.   [name] was instructed to call 911 with any severe reactions post vaccine: Marland Kitchen Difficulty breathing  . Swelling of your face and throat  . A fast heartbeat  . A bad rash all over your body  . Dizziness and weakness    Immunizations Administered    Name Date Dose VIS Date Route   Pfizer COVID-19 Vaccine 01/31/2020  1:16 PM 0.3 mL 11/29/2019 Intramuscular   Manufacturer: [employer], [employer]   Lot: [id number]   NDC: [address]-4

## 2020-02-03 IMAGING — CT CT ABDOMEN AND PELVIS WITHOUT CONTRAST
1 series · 13 of 27 positions shown, 17 images · non-contrast
Comparison: 02/25/2019 CT abdomen/pelvis.

CLINICAL DATA: Non targeted left renal cortex biopsy 02/25/2019.
Acute renal failure. Concern for upper right renal mass on prior
noncontrast CT. Patient reports abdominal and right flank pain and
diarrhea.

EXAM:
CT ABDOMEN AND PELVIS WITHOUT CONTRAST
TECHNIQUE: Multidetector CT imaging of the abdomen and pelvis was performed
following the standard protocol without IV contrast.

[Series 9: routine abdomen pelvis without 5.00 br60 s3 ax · axial · non-contrast · 0.61mm/px · z∈[+1375,+1490]mm · 13 of 27 slices shown, 17 images]
[im 3/27  soft-tissue]
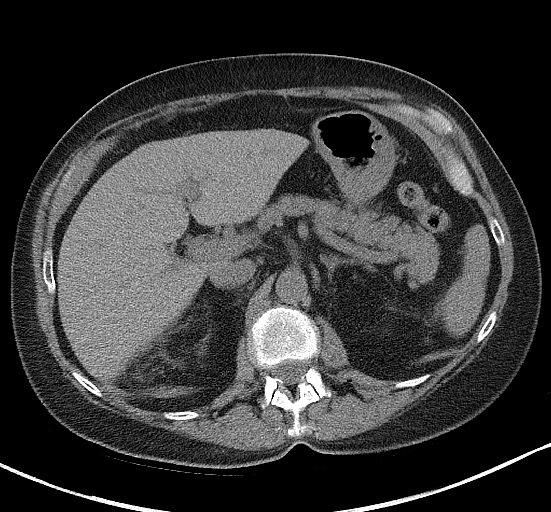
[im 3/27  bone]
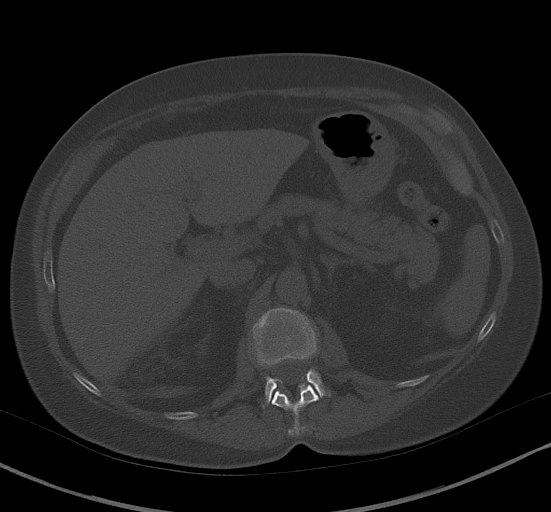
[im 5/27  soft-tissue]
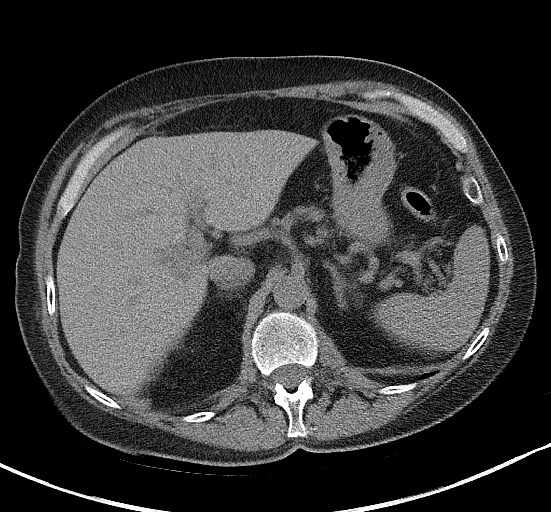
[im 7/27  soft-tissue]
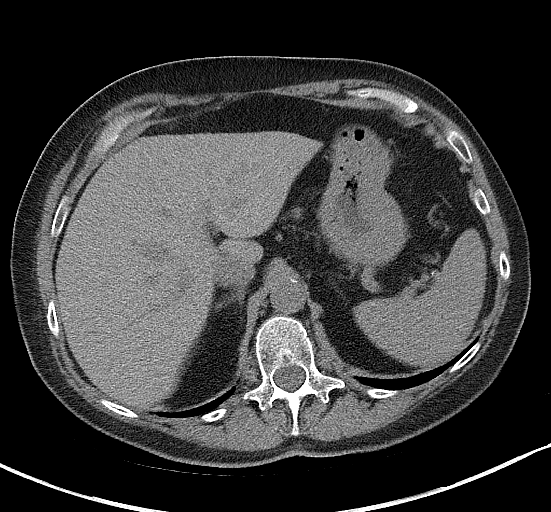
[im 9/27  soft-tissue]
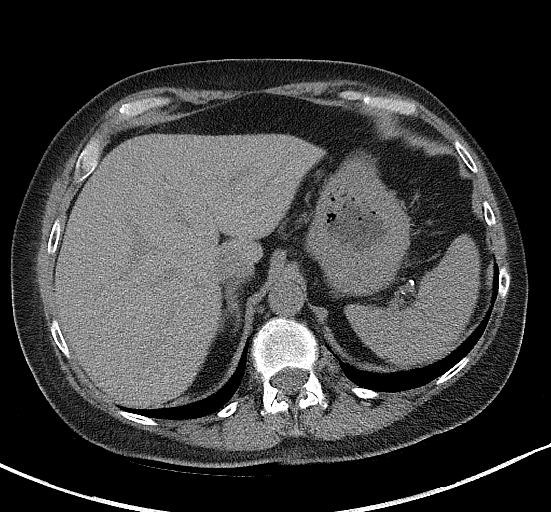
[im 12/27  soft-tissue]
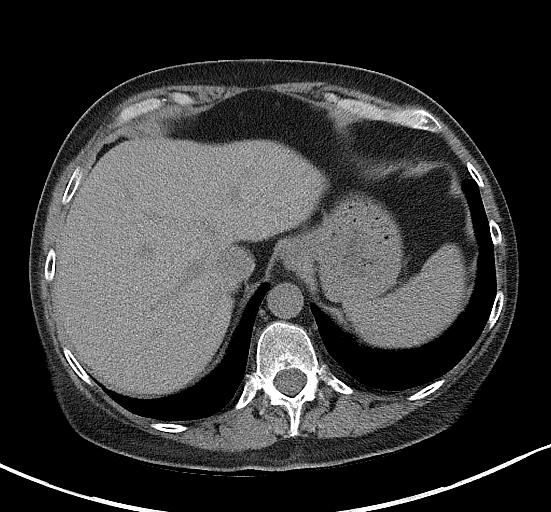
[im 14/27  soft-tissue]
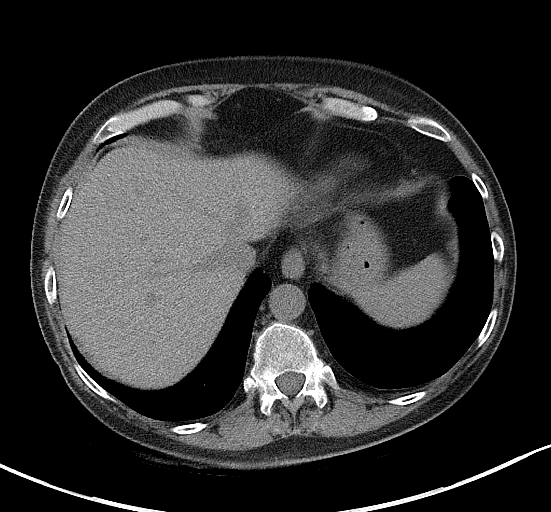
[im 16/27  soft-tissue]
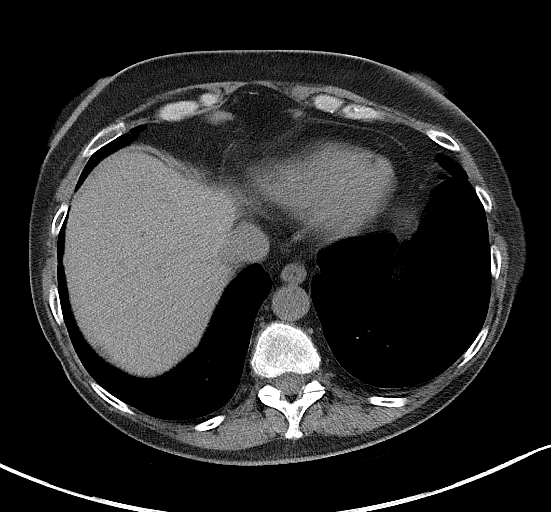
[im 19/27  soft-tissue]
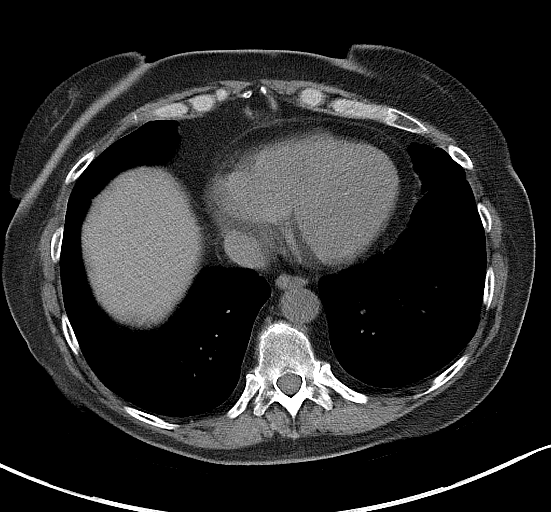
[im 21/27  soft-tissue]
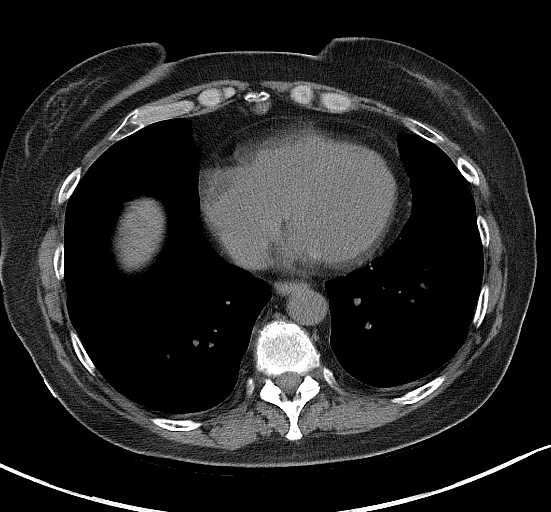
[im 21/27  bone]
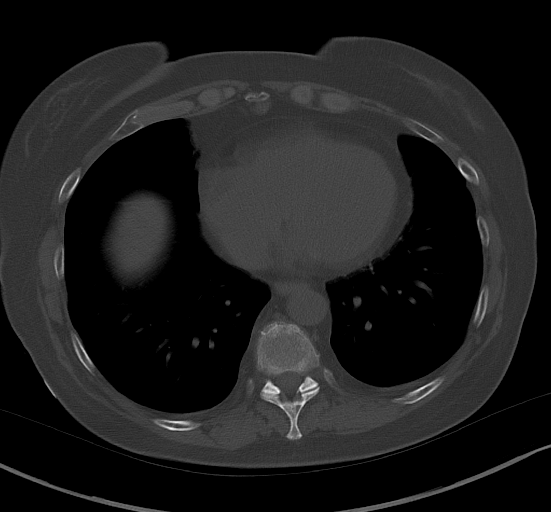
[im 23/27  soft-tissue]
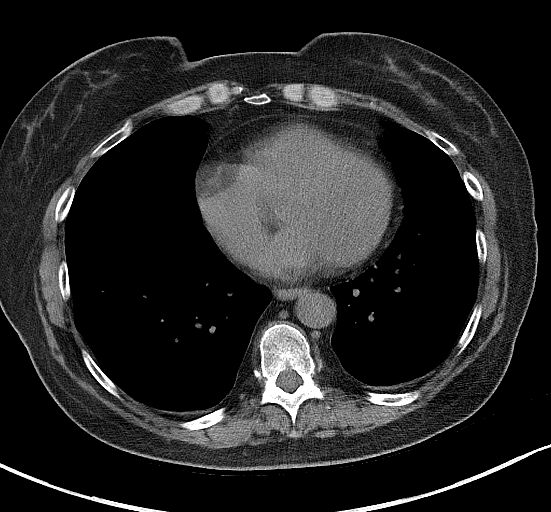
[im 23/27  lung]
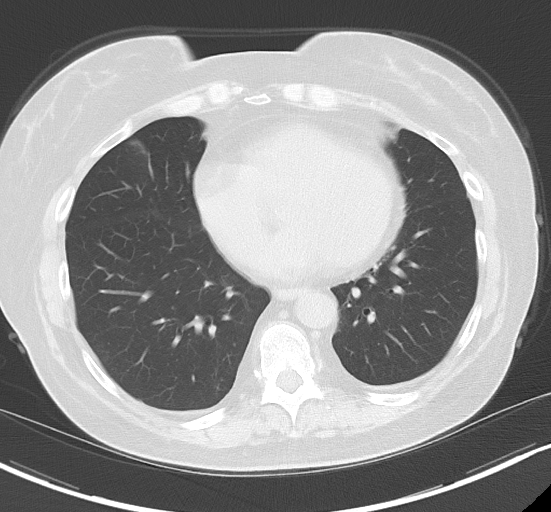
[im 24/27  lung]
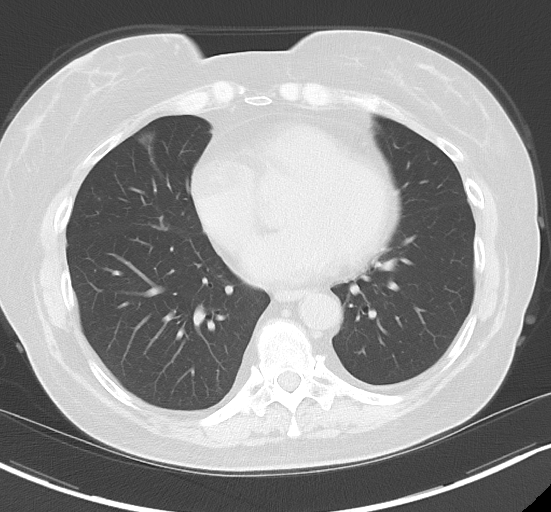
[im 25/27  soft-tissue]
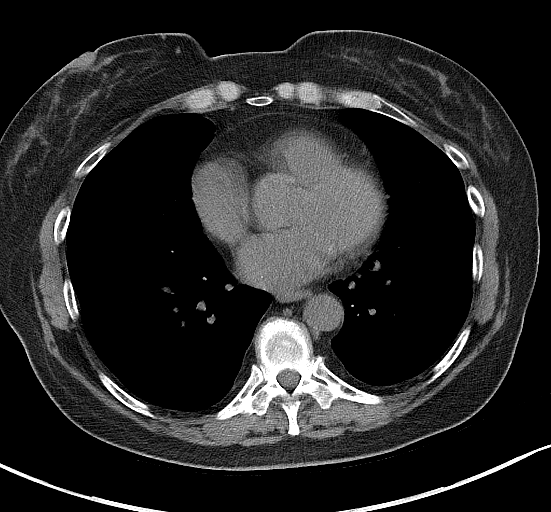
[im 25/27  lung]
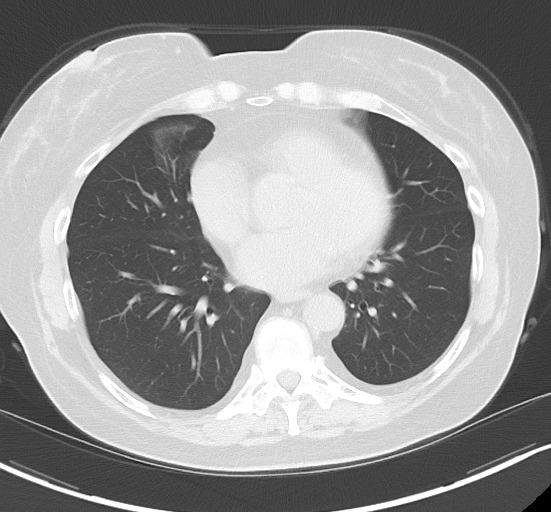
[im 26/27  lung]
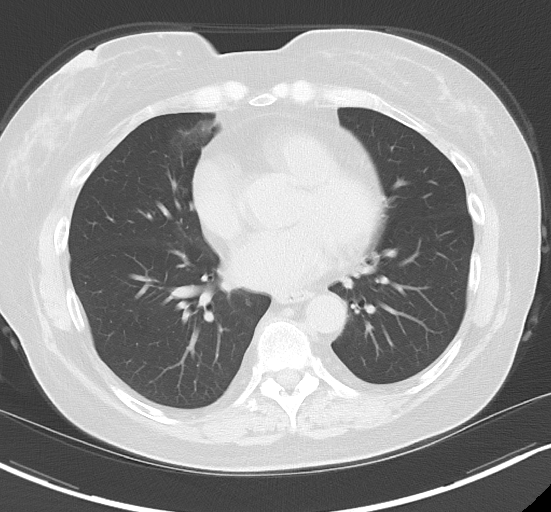

[13 of 27 positions shown; findings below may reference images not displayed]

FINDINGS: Lower chest: No significant pulmonary nodules or acute consolidative
airspace disease.

Hepatobiliary: Normal liver size. No liver mass. Normal gallbladder
with no radiopaque cholelithiasis. No biliary ductal dilatation.

Pancreas: Normal, with no mass or duct dilation.

Spleen: Normal size. No mass.

Adrenals/Urinary Tract: Normal adrenals. There is new mild right
hydroureteronephrosis to the level of the right ureterovesical
junction. The bladder and distal ureters are obscured by extensive
streak artifact from bilateral hip hardware. No stones are seen in
the renal collecting systems or visualized portions of the ureters
bilaterally. No left hydronephrosis. Normal caliber left ureter.
Asymmetric left renal cortical atrophy. Lobulated outer contour of
the kidneys bilaterally, most prominent in the upper right kidney
posteriorly (series 2/image 30), unchanged from prior.

Stomach/Bowel: Normal non-distended stomach. Normal caliber small
bowel with no small bowel wall thickening. Appendectomy. Normal
large bowel with no diverticulosis, large bowel wall thickening or
pericolonic fat stranding.

Vascular/Lymphatic: Minimally atherosclerotic nonaneurysmal
abdominal aorta. No pathologically enlarged lymph nodes in the
abdomen or pelvis.

Reproductive: Deep pelvis is obscured by extensive streak artifact.
Hysterectomy. No appreciable adnexal masses.

Other: No pneumoperitoneum, ascites or focal fluid collection.
Status post ventral abdominal hernia mesh repair with no evidence of
recurrent hernia.

Musculoskeletal: No aggressive appearing focal osseous lesions.
Bilateral total hip arthroplasty. Moderate lumbar spondylosis.
IMPRESSION: 1. New mild right hydroureteronephrosis to the level of the right
UVJ. The distal ureters and bladder cannot be evaluated on this scan
due to extensive streak artifact from bilateral hip hardware.
Obstructing right UVJ stone or mass not excluded. Urology
consultation suggested.
2. Lobulated outer contour of the kidneys bilaterally, most
prominent in the upper right kidney posteriorly, unchanged from
02/25/2019 CT, cannot exclude a renal cortical mass on this
noncontrast CT. Short-term outpatient MRI abdomen suggested for
further evaluation, which may be performed without IV contrast if
contraindicated due to renal dysfunction.
3.  Aortic Atherosclerosis ([id number]).

These results will be called to the ordering clinician or
representative by the [HOSPITAL] at the imaging location.

## 2020-05-07 ENCOUNTER — Ambulatory Visit: Payer: Self-pay | Admitting: *Deleted

## 2020-05-07 ENCOUNTER — Other Ambulatory Visit: Payer: Self-pay | Admitting: Family Medicine

## 2020-05-07 DIAGNOSIS — K2100 Gastro-esophageal reflux disease with esophagitis, without bleeding: Secondary | ICD-10-CM

## 2020-05-07 MED ORDER — ESOMEPRAZOLE MAGNESIUM 40 MG PO CPDR: 40.0000 mg | DELAYED_RELEASE_CAPSULE | Freq: Every day | ORAL | 12 refills | Status: DC

## 2020-05-07 NOTE — Telephone Encounter (Signed)
Okay to refill Nexium for 3 months.  Agree with cream that she needs appointment for any rash

## 2020-05-07 NOTE — Telephone Encounter (Signed)
Patient wore new piece of clothing and began itching immediately but fell asleep in it and woke up with hives all over. That was Saturday. Most have resolved using benadryl and cortisone cream. Several small red itchy areas on both upper arms and chest that won't clear up. No SOB/CP/Fever. None are fluid filled and are barely noticeable but are bothersome.  She is requesting a prescription creme to apply to the areas. Care Advice including continue benadryl, cortisone cream and cold compress to the areas.  Pharmacy on [facility].  Reason for Disposition . [1] Redness or itching where jewelry (or metal) touches skin AND [2] jewelry contains nickel  Answer Assessment - Initial Assessment Questions 1.) CALLER DIAGNOSIS: "What do you think is causing the rash?" (e.g., Chickenpox, Hives, Impetigo, Athlete's Foot, etc.)  Wore a piece of clothing and became itchy immediately then hives 2.) LOCATION:  "Is it widespread or localized?"  All over 3.) NEW MEDICATIONS: "Are you taking any new medicine?" no  Protocols used: RASH OR REDNESS - LOCALIZED-A-AH, RASH - GUIDELINE SELECTION-A-AH

## 2020-05-07 NOTE — Telephone Encounter (Signed)
Copied from CRM [id number]. Topic: Quick Communication - Rx Refill/Question >> May 07, 2020  9:08 AM Dalphine Handing A wrote: Medication: NEXIUM 40 MG capsule , triamcinolone acetonide cream (Patient requesting new prescription be sent to pharmacy for nexium and stated that she needs the cream sent to pharmacy for rash )  Has the patient contacted their pharmacy? Yes (Agent: If no, request that the patient contact the pharmacy for the refill.) (Agent: If yes, when and what did the pharmacy advise?)Contact PCP  Preferred Pharmacy (with phone number or street name): [facility] - [place], [place] - [address]  Phone:  [phone] Fax:  [phone]     Agent: Please be advised that RX refills may take up to 3 business days. We ask that you follow-up with your pharmacy.

## 2020-05-07 NOTE — Telephone Encounter (Signed)
Please advise message below  °

## 2020-05-07 NOTE — Telephone Encounter (Signed)
Call to patient- left message on VM:  It has been several years since patient has used Nexium- want to discuss reason for request. Also - no history of use/Rx for triamcinolone acetonide cream in medication record. If she has rash that needs treatment- needs appointment. Advised patient call back and schedule appointment.

## 2020-05-07 NOTE — Telephone Encounter (Signed)
Please advise 

## 2020-05-08 ENCOUNTER — Encounter: Payer: Self-pay | Admitting: Family Medicine

## 2020-05-28 ENCOUNTER — Other Ambulatory Visit: Payer: Self-pay | Admitting: Family Medicine

## 2020-06-30 DIAGNOSIS — N390 Urinary tract infection, site not specified: Secondary | ICD-10-CM | POA: Diagnosis not present

## 2020-06-30 DIAGNOSIS — B9620 Unspecified Escherichia coli [E. coli] as the cause of diseases classified elsewhere: Secondary | ICD-10-CM | POA: Diagnosis not present

## 2020-06-30 DIAGNOSIS — N201 Calculus of ureter: Secondary | ICD-10-CM | POA: Diagnosis not present

## 2020-06-30 DIAGNOSIS — R1084 Generalized abdominal pain: Secondary | ICD-10-CM | POA: Diagnosis not present

## 2020-07-09 DIAGNOSIS — N10 Acute tubulo-interstitial nephritis: Secondary | ICD-10-CM | POA: Diagnosis not present

## 2020-07-09 DIAGNOSIS — N184 Chronic kidney disease, stage 4 (severe): Secondary | ICD-10-CM | POA: Diagnosis not present

## 2020-07-09 DIAGNOSIS — K219 Gastro-esophageal reflux disease without esophagitis: Secondary | ICD-10-CM | POA: Diagnosis not present

## 2020-07-09 DIAGNOSIS — I129 Hypertensive chronic kidney disease with stage 1 through stage 4 chronic kidney disease, or unspecified chronic kidney disease: Secondary | ICD-10-CM | POA: Diagnosis not present

## 2020-07-13 ENCOUNTER — Other Ambulatory Visit: Payer: Self-pay | Admitting: Family Medicine

## 2020-07-13 DIAGNOSIS — R1084 Generalized abdominal pain: Secondary | ICD-10-CM | POA: Diagnosis not present

## 2020-07-13 DIAGNOSIS — N390 Urinary tract infection, site not specified: Secondary | ICD-10-CM | POA: Diagnosis not present

## 2020-07-13 DIAGNOSIS — B9620 Unspecified Escherichia coli [E. coli] as the cause of diseases classified elsewhere: Secondary | ICD-10-CM | POA: Diagnosis not present

## 2020-07-13 NOTE — Telephone Encounter (Signed)
Requested medication (s) are due for refill today: yes  Requested medication (s) are on the active medication list: yes  Last refill:  06/15/2020  Future visit scheduled: no  Notes to clinic: Patient needs follow up   Requested Prescriptions  Pending Prescriptions Disp Refills   metoprolol succinate (TOPROL-XL) 50 MG 24 hr tablet [Pharmacy Med Name: METOPROLOL SUCC ER 50 MG TAB] 30 tablet 0    Sig: TAKE 1 TABLET DAILY WITH OR IMMEDIATELY FOLLOWING A MEAL.      Cardiovascular:  Beta Blockers Failed - 07/13/2020 10:58 AM      Failed - Last Heart Rate in normal range    Pulse Readings from Last 1 Encounters:  02/25/19 (!) 130          Failed - Valid encounter within last 6 months    Recent Outpatient Visits           1 year ago AKI (acute kidney injury) [facility])   [facility] Maple Hudson., [facility]   1 year ago Urinary tract infection with hematuria, site unspecified   [facility] Maple Hudson., MD   2 years ago Other fatigue   [facility] Maple Hudson., MD   2 years ago Chronic bronchitis, unspecified chronic bronchitis type [facility])   [facility] Maple Hudson., MD   3 years ago Primary osteoarthritis of left hip   [facility] Maple Hudson., MD              Passed - Last BP in normal range    BP Readings from Last 1 Encounters:  02/25/19 124/84

## 2020-08-04 ENCOUNTER — Other Ambulatory Visit: Payer: Self-pay | Admitting: Family Medicine

## 2020-08-04 MED ORDER — METOPROLOL SUCCINATE ER 50 MG PO TB24: ORAL_TABLET | ORAL | 0 refills | Status: DC

## 2020-08-04 NOTE — Telephone Encounter (Signed)
Pt request refill  metoprolol succinate (TOPROL-XL) 50 MG 24 hr tablet  Pt has made appt for 9/01 but would like a 30 day to get her through to this appt.  [facility] - [place], [place] - [address] Phone:  [phone]  Fax:  [phone]

## 2020-08-14 NOTE — Progress Notes (Signed)
Inetta Fermo Cummings,acting as a scribe for [name], MD.,have documented all relevant documentation on the behalf of [name], MD,as directed by  [name], MD while in the presence of [name], MD.   Established patient visit   Patient: Ana Ryan   DOB: 1956/02/21   64 y.o. Female  MRN: [id number] Visit Date: 08/19/2020  Today's healthcare provider: [name], MD   Chief Complaint  Patient presents with  . Hypertension   Subjective    HPI  Overall patient is doing well.  She sees nephrology of every few months because of CKD from 2020 episode.  She needs refill on her metoprolol.  Overall she feels well her blood pressures been well controlled. Hypertension, follow-up  BP Readings from Last 3 Encounters:  08/19/20 (!) 146/92  02/25/19 124/84  02/25/19 (!) 156/91   Wt Readings from Last 3 Encounters:  08/19/20 155 lb 3.2 oz (70.4 kg)  02/25/19 145 lb (65.8 kg)  02/13/19 149 lb (67.6 kg)     She was last seen for hypertension 02/13/2019.  BP at that visit was 110/62. Management since that visit includes; Off of amlodipine and metoprolol.RTC 1-3 months. She reports excellent compliance with treatment. She is not having side effects.  She is exercising. She is adherent to low salt diet.   Outside blood pressures are being checked .  She does not smoke.  Use of agents associated with hypertension: none.   ---------------------------------------------------------------------------------------------------  AKI (acute kidney injury) (HCC) From 02/13/2019-Secondary to dehydration and possibly sepsis.  She has been followed by nephrology.  Mild major depression (HCC) From 02/13/2019-In partial remission.  Alcohol abuse From 02/13/2019-No Drink in a few weeks.   Social History   Tobacco Use  . Smoking status: Former Games developer  . Smokeless tobacco: Never Used  . Tobacco comment: did not smoke regularly  Vaping Use   . Vaping Use: Never used  Substance Use Topics  . Alcohol use: Yes    Comment: h/o heavy use  . Drug use: No       Medications: Outpatient Medications Prior to Visit  Medication Sig  . esomeprazole (NEXIUM) 40 MG capsule Take 1 capsule (40 mg total) by mouth daily. No product substitutions"Nexium only"  . metoprolol succinate (TOPROL-XL) 50 MG 24 hr tablet TAKE 1 TABLET DAILY WITH OR IMMEDIATELY FOLLOWING A MEAL.  . famotidine (PEPCID) 20 MG tablet Take 20 mg by mouth daily as needed for heartburn or indigestion. (Patient not taking: Reported on 08/19/2020)  . oxyCODONE-acetaminophen (PERCOCET/ROXICET) 5-325 MG tablet Take 1-2 tablets by mouth every 6 (six) hours as needed for severe pain. (Patient not taking: Reported on 08/19/2020)   No facility-administered medications prior to visit.    Review of Systems  Constitutional: Negative for appetite change, chills, fatigue and fever.  Respiratory: Negative for chest tightness and shortness of breath.   Cardiovascular: Negative for chest pain and palpitations.  Gastrointestinal: Negative for abdominal pain, nausea and vomiting.  Neurological: Negative for dizziness and weakness.       Objective    BP (!) 146/92 (BP Location: Right Arm, Patient Position: Sitting, Cuff Size: Normal)   Pulse 60   Temp (!) 97.4 F (36.3 C) (Oral)   Ht 5\' 2"  (1.575 m)   Wt 155 lb 3.2 oz (70.4 kg)   BMI 28.39 kg/m  BP Readings from Last 3 Encounters:  08/19/20 (!) 146/92  02/25/19 124/84  02/25/19 (!) 156/91   Wt Readings  from Last 3 Encounters:  08/19/20 155 lb 3.2 oz (70.4 kg)  02/25/19 145 lb (65.8 kg)  02/13/19 149 lb (67.6 kg)      Physical Exam Vitals reviewed.  Constitutional:      Appearance: She is well-developed.  HENT:     Head: Normocephalic and atraumatic.     Right Ear: External ear normal.     Left Ear: External ear normal.     Nose: Nose normal.  Eyes:     General: No scleral icterus.    Conjunctiva/sclera:  Conjunctivae normal.  Neck:     Thyroid: No thyromegaly.  Cardiovascular:     Rate and Rhythm: Normal rate and regular rhythm.     Heart sounds: Normal heart sounds.  Pulmonary:     Effort: Pulmonary effort is normal.     Breath sounds: Normal breath sounds.  Abdominal:     Palpations: Abdomen is soft.  Skin:    General: Skin is warm and dry.  Neurological:     Mental Status: She is alert and oriented to person, place, and time.  Psychiatric:        Behavior: Behavior normal.        Thought Content: Thought content normal.        Judgment: Judgment normal.       No results found for any visits on 08/19/20.  Assessment & Plan     1. Essential hypertension Fair control - metoprolol succinate (TOPROL-XL) 50 MG 24 hr tablet; TAKE 1 TABLET DAILY WITH OR IMMEDIATELY FOLLOWING A MEAL.  Dispense: 90 tablet; Refill: 1  2. Mild major depression (HCC) In  remission presently on no medication.  3. AKI (acute kidney injury) (HCC) She was hospitalized a year ago and is followed by nephrology for CKD due to the AKI  4. Chronic renal impairment, stage 3 (moderate), unspecified whether stage 3a or 3b CKD Saw nephrology 1 month ago  5. H/o Alcohol abuse Clinically patient states she is doing well.   No follow-ups on file.      I, [name], MD, have reviewed all documentation for this visit. The documentation on 09/01/20 for the exam, diagnosis, procedures, and orders are all accurate and complete.    Nitesh Pitstick Wendelyn Breslow, MD  [facility] [phone] (phone) [phone] (fax)  [facility]

## 2020-08-19 ENCOUNTER — Other Ambulatory Visit: Payer: Self-pay

## 2020-08-19 ENCOUNTER — Ambulatory Visit (INDEPENDENT_AMBULATORY_CARE_PROVIDER_SITE_OTHER): Payer: BC Managed Care – PPO | Admitting: Family Medicine

## 2020-08-19 ENCOUNTER — Encounter: Payer: Self-pay | Admitting: Family Medicine

## 2020-08-19 VITALS — BP 146/92 | HR 60 | Temp 97.4°F | Ht 62.0 in | Wt 155.2 lb

## 2020-08-19 DIAGNOSIS — F320 Major depressive disorder, single episode, mild: Secondary | ICD-10-CM | POA: Diagnosis not present

## 2020-08-19 DIAGNOSIS — I10 Essential (primary) hypertension: Secondary | ICD-10-CM | POA: Diagnosis not present

## 2020-08-19 DIAGNOSIS — F1010 Alcohol abuse, uncomplicated: Secondary | ICD-10-CM

## 2020-08-19 DIAGNOSIS — N179 Acute kidney failure, unspecified: Secondary | ICD-10-CM | POA: Diagnosis not present

## 2020-08-19 DIAGNOSIS — N183 Chronic kidney disease, stage 3 unspecified: Secondary | ICD-10-CM | POA: Diagnosis not present

## 2020-08-19 MED ORDER — METOPROLOL SUCCINATE ER 50 MG PO TB24: ORAL_TABLET | ORAL | 1 refills | Status: DC

## 2020-09-18 DIAGNOSIS — N201 Calculus of ureter: Secondary | ICD-10-CM | POA: Diagnosis not present

## 2021-01-01 DIAGNOSIS — Z1152 Encounter for screening for COVID-19: Secondary | ICD-10-CM | POA: Diagnosis not present

## 2021-01-04 ENCOUNTER — Telehealth: Payer: Self-pay

## 2021-01-04 NOTE — Telephone Encounter (Signed)
Copied from CRM [id number]. Topic: General - Other >> Jan 04, 2021 10:06 AM [name] wrote: Reason for CRM: Pt stated she was tested for Covid but told the results are inconclusive. Pt stated she would like Dr. Sullivan Lone or his nurse to call her back for advice

## 2021-01-06 NOTE — Telephone Encounter (Signed)
Pt returning call. Please advise. °

## 2021-01-06 NOTE — Telephone Encounter (Signed)
Please check to see if we need to do anything to help the patient.  Thank you

## 2021-01-06 NOTE — Telephone Encounter (Signed)
Left message to call back  

## 2021-01-07 NOTE — Telephone Encounter (Signed)
Returned call to patient. Patient stated she doesn't need anything, she just wanted our opinion on how long she needed to quarantine.

## 2021-01-29 ENCOUNTER — Other Ambulatory Visit: Payer: Self-pay | Admitting: Family Medicine

## 2021-01-29 DIAGNOSIS — I10 Essential (primary) hypertension: Secondary | ICD-10-CM

## 2021-02-04 ENCOUNTER — Telehealth: Payer: Self-pay | Admitting: Family Medicine

## 2021-02-04 NOTE — Telephone Encounter (Signed)
Patient states boil is in between her breasts where the bra tests.  Boil has been inflamed for around 1 week. Boil is about the size a quarter, red, slightly swollen, painful and has pus in it. Please advise?

## 2021-02-04 NOTE — Telephone Encounter (Signed)
Hot compresses as frequently as possible.  May need to be seen to have a drained.

## 2021-02-04 NOTE — Telephone Encounter (Signed)
Pt called and asked for a call from nurse or Dr. Sullivan Lone Pt has a painful boil and wanted to know what she can put on it/ Pt didn't want to come in or schedule a virtual / please advise

## 2021-02-04 NOTE — Telephone Encounter (Signed)
Patient was advised.  

## 2021-02-08 MED ORDER — AMOXICILLIN-POT CLAVULANATE 875-125 MG PO TABS: 1.0000 | ORAL_TABLET | Freq: Two times a day (BID) | ORAL | 0 refills | Status: DC

## 2021-02-08 NOTE — Telephone Encounter (Signed)
Augmentin 875 twice daily for 1 week.  Probably need appointment for to possibly be drained.

## 2021-02-08 NOTE — Telephone Encounter (Signed)
PT called in and stated she is still worried about the boil, PT wanted to know if it would be possible to send in an antibiotic for her or something to help. Please advise.

## 2021-02-08 NOTE — Addendum Note (Signed)
Addended by: Marlene Lard on: 02/08/2021 02:45 PM   Modules accepted: Orders

## 2021-02-09 NOTE — Telephone Encounter (Signed)
Rx sent to pharmacy   

## 2021-02-17 ENCOUNTER — Ambulatory Visit: Payer: Self-pay | Admitting: Family Medicine

## 2021-02-23 ENCOUNTER — Ambulatory Visit: Payer: Self-pay | Admitting: Family Medicine

## 2021-03-02 ENCOUNTER — Ambulatory Visit: Payer: Self-pay | Admitting: Family Medicine

## 2021-04-05 ENCOUNTER — Ambulatory Visit: Payer: Self-pay | Admitting: *Deleted

## 2021-04-05 NOTE — Telephone Encounter (Signed)
I returned pt's call.  She was exposed to covid over the weekend.   She had covid in Jan 2022. I went over the CDC latest guidelines with her.   She is up to date on her covid vaccines.  Not having symptoms.   She does not need to quarantine.    I instructed her to be tested at least 5 days after exposure and to monitor for symptoms for 10 days from the date of exposure.   If she develops symptoms to isolate and be tested per [facility] guidelines Jan. 2022.    She asked if her daughter who is high risk should do anything special.   I instructed her to have her daughter call her primary care physician since they would be familiar with her daughter's medical history for further advice.   She thanked me for my help.    Reason for Disposition . [1] CLOSE CONTACT COVID-19 EXPOSURE within last 14 days AND [2] NO symptoms  Answer Assessment - Initial Assessment Questions 1. COVID-19 EXPOSURE: "Please describe how you were exposed to someone with a COVID-19 infection."     I had covid in Jan. 2022.    Over the weekend my granddaughter was sick.   She has tested positive for covid.   Do I need to quarantine?  I'm up to date with my vaccines.  2 primary shots and 1 booster. 2. PLACE of CONTACT: "Where were you when you were exposed to COVID-19?" (e.g., home, school, medical waiting room; which city?)     Daughter's house 3. TYPE of CONTACT: "How much contact was there?" (e.g., sitting next to, live in same house, work in same office, same building)     There for the weekend 4. DURATION of CONTACT: "How long were you in contact with the COVID-19 patient?" (e.g., a few seconds, passed by person, a few minutes, 15 minutes or longer, live with the patient)     All weekend 5. MASK: "Were you wearing a mask?" "Was the other person wearing a mask?" Note: wearing a mask reduces the risk of an otherwise close contact.     No 6. DATE of CONTACT: "When did you have contact with a COVID-19 patient?" (e.g., how many  days ago)     4/16 7. COMMUNITY SPREAD: "Are there lots of cases of COVID-19 (community spread) where you live?" (See public health department website, if unsure)       Mild 8. SYMPTOMS: "Do you have any symptoms?" (e.g., fever, cough, breathing difficulty, loss of taste or smell)     No 9. VACCINE: "Have you gotten the COVID-19 vaccine?" If Yes, ask: "Which one, how many shots, when did you get it?"     Yes 2 primary shots and 1 booster 10. BOOSTER: "Have you received your COVID-19 booster?" If Yes, ask: "Which one and when did you get it?"       Yes 11. PREGNANCY OR POSTPARTUM: "Is there any chance you are pregnant?" "When was your last menstrual period?" "Did you deliver in the last 2 weeks?"       No 12. HIGH RISK: "Do you have any heart or lung problems?" (e.g., asthma , COPD, heart failure) "Do you have a weak immune system or other risk factors?" (e.g., HIV positive, chemotherapy, renal failure, diabetes mellitus, sickle cell anemia, obesity)       Not asked 13. TRAVEL: "Have you traveled out of the country recently?" If Yes, ask: "When and where?"  Note: Travel becomes  less relevant if there is widespread community transmission where the patient lives.       No  Protocols used: CORONAVIRUS (COVID-19) EXPOSURE-A-AH

## 2021-07-24 ENCOUNTER — Other Ambulatory Visit: Payer: Self-pay | Admitting: Family Medicine

## 2021-07-24 DIAGNOSIS — I10 Essential (primary) hypertension: Secondary | ICD-10-CM

## 2021-07-24 NOTE — Telephone Encounter (Signed)
Last RF 01/29/21 #90 1 RF RF due, no future visit Sent pt a message to pt via MyChart to make appt Requested Prescriptions  Pending Prescriptions Disp Refills   metoprolol succinate (TOPROL-XL) 50 MG 24 hr tablet [Pharmacy Med Name: metoprolol succinate ER 50 mg tablet,extended release 24 hr] 90 tablet 1    Sig: TAKE 1 TABLET DAILY WITH OR IMMEDIATELY FOLLOWING A MEAL.      Cardiovascular:  Beta Blockers Failed - 07/24/2021 12:25 PM      Failed - Last BP in normal range    BP Readings from Last 1 Encounters:  08/19/20 (!) 146/92          Failed - Valid encounter within last 6 months    Recent Outpatient Visits           11 months ago Essential hypertension   [facility] Maple Hudson., MD   2 years ago AKI (acute kidney injury) [facility])   [facility] Maple Hudson., MD   2 years ago Urinary tract infection with hematuria, site unspecified   [facility] Maple Hudson., MD   3 years ago Other fatigue   [facility] Maple Hudson., MD   3 years ago Chronic bronchitis, unspecified chronic bronchitis type [facility])   [facility] Maple Hudson., MD                Passed - Last Heart Rate in normal range    Pulse Readings from Last 1 Encounters:  08/19/20 60

## 2021-07-24 NOTE — Telephone Encounter (Signed)
Last RF 01/29/21 #90 1 RF Pt needs appt- sent pt message MyChart message to make appt. Requested Prescriptions  Pending Prescriptions Disp Refills   metoprolol succinate (TOPROL-XL) 50 MG 24 hr tablet [Pharmacy Med Name: metoprolol succinate ER 50 mg tablet,extended release 24 hr] 90 tablet 1    Sig: TAKE 1 TABLET DAILY WITH OR IMMEDIATELY FOLLOWING A MEAL.      Cardiovascular:  Beta Blockers Failed - 07/24/2021  8:00 AM      Failed - Last BP in normal range    BP Readings from Last 1 Encounters:  08/19/20 (!) 146/92          Failed - Valid encounter within last 6 months    Recent Outpatient Visits           11 months ago Essential hypertension   [facility] Maple Hudson., MD   2 years ago AKI (acute kidney injury) [facility])   [facility] Maple Hudson., MD   2 years ago Urinary tract infection with hematuria, site unspecified   [facility] Maple Hudson., MD   3 years ago Other fatigue   [facility] Maple Hudson., MD   3 years ago Chronic bronchitis, unspecified chronic bronchitis type [facility])   [facility] Maple Hudson., MD                Passed - Last Heart Rate in normal range    Pulse Readings from Last 1 Encounters:  08/19/20 60

## 2021-07-26 ENCOUNTER — Other Ambulatory Visit: Payer: Self-pay | Admitting: Family Medicine

## 2021-07-26 DIAGNOSIS — I10 Essential (primary) hypertension: Secondary | ICD-10-CM

## 2021-08-16 ENCOUNTER — Other Ambulatory Visit: Payer: Self-pay | Admitting: Family Medicine

## 2021-08-16 ENCOUNTER — Telehealth: Payer: Self-pay | Admitting: Family Medicine

## 2021-08-16 DIAGNOSIS — I10 Essential (primary) hypertension: Secondary | ICD-10-CM

## 2021-08-16 NOTE — Telephone Encounter (Signed)
Please review. Patient next appt is 09/13/21.

## 2021-08-16 NOTE — Telephone Encounter (Signed)
Please see other refill encounter on 07/27/21. Patient requesting a month supply due to going out of town tomorrow. Patient overdue for follow-up. Next appt 09/13/21.

## 2021-08-16 NOTE — Telephone Encounter (Signed)
Pt called saying he was going out of town tomorrow and wants to know if dr. Sullivan Lone will give her one months supply of the Metoprolol 50 mg    [facility]

## 2021-08-17 ENCOUNTER — Other Ambulatory Visit: Payer: Self-pay

## 2021-08-18 ENCOUNTER — Ambulatory Visit: Payer: BC Managed Care – PPO | Admitting: Family Medicine

## 2021-09-09 ENCOUNTER — Ambulatory Visit: Payer: BC Managed Care – PPO | Admitting: Family Medicine

## 2021-09-13 ENCOUNTER — Ambulatory Visit: Payer: BC Managed Care – PPO | Admitting: Family Medicine

## 2021-09-30 ENCOUNTER — Ambulatory Visit: Payer: BC Managed Care – PPO | Admitting: Family Medicine

## 2021-10-06 ENCOUNTER — Other Ambulatory Visit: Payer: Self-pay

## 2021-10-06 ENCOUNTER — Ambulatory Visit (INDEPENDENT_AMBULATORY_CARE_PROVIDER_SITE_OTHER): Payer: BC Managed Care – PPO | Admitting: Family Medicine

## 2021-10-06 ENCOUNTER — Encounter: Payer: Self-pay | Admitting: Family Medicine

## 2021-10-06 VITALS — BP 142/86 | HR 60 | Temp 98.5°F | Resp 16 | Ht 62.0 in | Wt 151.0 lb

## 2021-10-06 DIAGNOSIS — I10 Essential (primary) hypertension: Secondary | ICD-10-CM

## 2021-10-06 DIAGNOSIS — E7800 Pure hypercholesterolemia, unspecified: Secondary | ICD-10-CM | POA: Diagnosis not present

## 2021-10-06 DIAGNOSIS — R739 Hyperglycemia, unspecified: Secondary | ICD-10-CM

## 2021-10-06 DIAGNOSIS — H6121 Impacted cerumen, right ear: Secondary | ICD-10-CM | POA: Diagnosis not present

## 2021-10-06 NOTE — Progress Notes (Signed)
I,Ana Ryan,acting as a scribe for Ana Mans, MD.,have documented all relevant documentation on the behalf of Ana Mans, MD,as directed by  Ana Mans, MD while in the presence of Ana Mans, MD.   Established patient visit   Patient: Ana Ryan   DOB: August 06, 1956   65 y.o. Female  MRN: [id number] Visit Date: 10/06/2021  Today's healthcare provider: [name], MD   Chief Complaint  Patient presents with   Follow-up   Hypertension   Hyperlipidemia   Subjective    HPI  Patient comes in today for follow-up.  She feels well overall.  She is taking very little medication now other than Nexium metoprolol. Ear fullness on the right with decreased hearing is the only problem she is having. Hypertension, follow-up  BP Readings from Last 3 Encounters:  10/06/21 (!) 142/86  08/19/20 (!) 146/92  02/25/19 124/84   Wt Readings from Last 3 Encounters:  10/06/21 151 lb (68.5 kg)  08/19/20 155 lb 3.2 oz (70.4 kg)  02/25/19 145 lb (65.8 kg)     She was last seen for hypertension 12 months ago.  BP at that visit was as above. Management since that visit includes none.  She reports good compliance with treatment. She is not having side effects. none She is following a Regular diet. She is exercising. She does not smoke.  Use of agents associated with hypertension: none.   Outside blood pressures are home bp's are up and down.  Pertinent labs: Lab Results  Component Value Date   CHOL 205 (A) 08/20/2013   HDL 67 08/20/2013   LDLCALC 112 08/20/2013   TRIG 129 08/20/2013   Lab Results  Component Value Date   NA 134 (L) 02/25/2019   K 3.5 02/25/2019   CREATININE 3.01 (H) 02/25/2019   GFRNONAA 16 (L) 02/25/2019   GLUCOSE 113 (H) 02/25/2019     The ASCVD Risk score (Arnett DK, et al., 2019) failed to calculate for the following reasons:   Cannot find a previous HDL lab   Cannot find a previous total cholesterol lab    --------------------------------------------------------------------------------------------------- Lipid/Cholesterol, Follow-up  Last lipid panel Other pertinent labs  Lab Results  Component Value Date   CHOL 205 (A) 08/20/2013   HDL 67 08/20/2013   LDLCALC 112 08/20/2013   TRIG 129 08/20/2013   Lab Results  Component Value Date   ALT 13 02/01/2019   AST 13 (L) 02/01/2019   PLT 339 02/25/2019   TSH 4.22 08/20/2013     She was last seen for this 12 months ago.  Management since that visit includes none.  She reports good compliance with treatment. She is not having side effects. none  Current diet: well balanced Current exercise: walking  The ASCVD Risk score (Arnett DK, et al., 2019) failed to calculate for the following reasons:   Cannot find a previous HDL lab   Cannot find a previous total cholesterol lab  ---------------------------------------------------------------------------------------------------    Medications: Outpatient Medications Prior to Visit  Medication Sig   esomeprazole (NEXIUM) 40 MG capsule Take 1 capsule (40 mg total) by mouth daily. No product substitutions"Nexium only"   metoprolol succinate (TOPROL-XL) 50 MG 24 hr tablet TAKE 1 TABLET DAILY WITH OR IMMEDIATELY FOLLOWING A MEAL. Schedule doctor's appointment for future refills   [DISCONTINUED] amoxicillin-clavulanate (AUGMENTIN) 875-125 MG tablet Take 1 tablet by mouth 2 (two) times daily.   [DISCONTINUED] famotidine (PEPCID) 20 MG tablet Take 20 mg by mouth  daily as needed for heartburn or indigestion. (Patient not taking: No sig reported)   [DISCONTINUED] oxyCODONE-acetaminophen (PERCOCET/ROXICET) 5-325 MG tablet Take 1-2 tablets by mouth every 6 (six) hours as needed for severe pain. (Patient not taking: No sig reported)   No facility-administered medications prior to visit.    Review of Systems      Objective    BP (!) 142/86 (BP Location: Right Arm, Patient Position: Sitting,  Cuff Size: Normal)   Pulse 60   Temp 98.5 F (36.9 C) (Temporal)   Resp 16   Ht 5\' 2"  (1.575 m)   Wt 151 lb (68.5 kg)   SpO2 96%   BMI 27.62 kg/m  BP Readings from Last 3 Encounters:  10/06/21 (!) 142/86  08/19/20 (!) 146/92  02/25/19 124/84   Wt Readings from Last 3 Encounters:  10/06/21 151 lb (68.5 kg)  08/19/20 155 lb 3.2 oz (70.4 kg)  02/25/19 145 lb (65.8 kg)      Physical Exam Vitals reviewed.  Constitutional:      General: She is not in acute distress.    Appearance: She is well-developed.  HENT:     Head: Normocephalic and atraumatic.     Right Ear: Hearing and external ear normal.     Left Ear: Hearing, tympanic membrane, ear canal and external ear normal.     Ears:     Comments: Right EAC blocked with cerumen    Nose: Nose normal.  Eyes:     General: Lids are normal. No scleral icterus.       Right eye: No discharge.        Left eye: No discharge.     Conjunctiva/sclera: Conjunctivae normal.  Cardiovascular:     Rate and Rhythm: Normal rate and regular rhythm.     Heart sounds: Normal heart sounds.  Pulmonary:     Effort: Pulmonary effort is normal. No respiratory distress.  Skin:    Findings: No lesion or rash.  Neurological:     General: No focal deficit present.     Mental Status: She is alert and oriented to person, place, and time.  Psychiatric:        Mood and Affect: Mood normal.        Speech: Speech normal.        Behavior: Behavior normal.        Thought Content: Thought content normal.        Judgment: Judgment normal.      No results found for any visits on 10/06/21.  Assessment & Plan     1. Ceruminosis, right Gated and clear and the patient feels much better  2. Primary hypertension Follow home blood pressure readings on metoprolol  3. Hyperglycemia Follow-up A1c  4. Hypercholesteremia Follow-up lipids in the future   No follow-ups on file.      I, 10/08/21, MD, have reviewed all documentation for this  visit. The documentation on 10/12/21 for the exam, diagnosis, procedures, and orders are all accurate and complete.    Ameya Kutz 10/14/21, MD  [facility] [phone] (phone) [phone] (fax)  [facility]

## 2021-12-02 ENCOUNTER — Ambulatory Visit: Payer: Self-pay | Admitting: *Deleted

## 2021-12-02 NOTE — Telephone Encounter (Signed)
°  Chief Complaint: Medication request: Tessalon pearl Symptoms: dry cough Frequency: 2 weeks Pertinent Negatives: Patient denies fever, chest congestion, nasal drainage Disposition: [] ED /[] Urgent Care (no appt availability in office) / [] Appointment(In office/virtual)/ []  [facility]/ [x] Home Care/ [] Refused Recommended Disposition  Additional Notes: Patient states she is at end of Upper respiratory virus and she just has a lingering dry cough. Patient is requesting tessalon pearl Rx for cough. Advised PCP is not in office- will send request for review.

## 2021-12-02 NOTE — Telephone Encounter (Signed)
Summary: advice - cough   Pt called in stating she has a nagging cough, stated Dr Sullivan Lone would normally send in something for her, pt stated she is at the end of her cold, no fever, but still cant get rid of the cough, pt needed advice.      Reason for Disposition  Cough  Answer Assessment - Initial Assessment Questions 1. ONSET: "When did the cough begin?"      2 weeks 2. SEVERITY: "How bad is the cough today?"      Hard to stop when she starts 3. SPUTUM: "Describe the color of your sputum" (none, dry cough; clear, white, yellow, green)     No sputum 4. HEMOPTYSIS: "Are you coughing up any blood?" If so ask: "How much?" (flecks, streaks, tablespoons, etc.)     no 5. DIFFICULTY BREATHING: "Are you having difficulty breathing?" If Yes, ask: "How bad is it?" (e.g., mild, moderate, severe)    - MILD: No SOB at rest, mild SOB with walking, speaks normally in sentences, can lie down, no retractions, pulse < 100.    - MODERATE: SOB at rest, SOB with minimal exertion and prefers to sit, cannot lie down flat, speaks in phrases, mild retractions, audible wheezing, pulse 100-120.    - SEVERE: Very SOB at rest, speaks in single words, struggling to breathe, sitting hunched forward, retractions, pulse > 120      normal 6. FEVER: "Do you have a fever?" If Yes, ask: "What is your temperature, how was it measured, and when did it start?"     no 7. CARDIAC HISTORY: "Do you have any history of heart disease?" (e.g., heart attack, congestive heart failure)      hypertension 8. LUNG HISTORY: "Do you have any history of lung disease?"  (e.g., pulmonary embolus, asthma, emphysema)     no 9. PE RISK FACTORS: "Do you have a history of blood clots?" (or: recent major surgery, recent prolonged travel, bedridden)     no 10. OTHER SYMPTOMS: "Do you have any other symptoms?" (e.g., runny nose, wheezing, chest pain)       Hoarse in the morning 11. PREGNANCY: "Is there any chance you are pregnant?" "When was your  last menstrual period?"       na 12. TRAVEL: "Have you traveled out of the country in the last month?" (e.g., travel history, exposures)       no  Protocols used: Cough - Acute Non-Productive-A-AH

## 2021-12-02 NOTE — Telephone Encounter (Signed)
Please review for Dr. Gilbert  Thanks,   -Ronaldo Crilly  

## 2021-12-03 ENCOUNTER — Other Ambulatory Visit: Payer: Self-pay | Admitting: *Deleted

## 2021-12-03 MED ORDER — BENZONATATE 100 MG PO CAPS: 100.0000 mg | ORAL_CAPSULE | Freq: Two times a day (BID) | ORAL | 0 refills | Status: DC | PRN

## 2021-12-03 NOTE — Telephone Encounter (Signed)
Patient tried Mucinex and cough drops. Patient states have not helped lingering cough.

## 2021-12-06 NOTE — Telephone Encounter (Signed)
Rx was sent to pharmacy. 

## 2022-04-13 ENCOUNTER — Encounter: Payer: BC Managed Care – PPO | Admitting: Family Medicine

## 2022-05-12 ENCOUNTER — Other Ambulatory Visit: Payer: Self-pay | Admitting: Family Medicine

## 2022-05-12 DIAGNOSIS — I10 Essential (primary) hypertension: Secondary | ICD-10-CM

## 2022-05-12 NOTE — Telephone Encounter (Signed)
Pt called to report that she is leaving town tomorrow morning, she had an accident with her current prescription. It fell in the sink and she has zero pills left. Please advise, pt is hoping to receive this today prior to her departure tomorrow.

## 2022-05-26 ENCOUNTER — Ambulatory Visit (INDEPENDENT_AMBULATORY_CARE_PROVIDER_SITE_OTHER): Payer: BC Managed Care – PPO | Admitting: Family Medicine

## 2022-05-26 ENCOUNTER — Encounter: Payer: Self-pay | Admitting: Family Medicine

## 2022-05-26 ENCOUNTER — Other Ambulatory Visit: Payer: Self-pay

## 2022-05-26 VITALS — BP 128/94 | HR 60 | Temp 98.0°F | Resp 16 | Wt 155.8 lb

## 2022-05-26 DIAGNOSIS — N12 Tubulo-interstitial nephritis, not specified as acute or chronic: Secondary | ICD-10-CM | POA: Diagnosis not present

## 2022-05-26 DIAGNOSIS — E7800 Pure hypercholesterolemia, unspecified: Secondary | ICD-10-CM | POA: Diagnosis not present

## 2022-05-26 DIAGNOSIS — I10 Essential (primary) hypertension: Secondary | ICD-10-CM | POA: Diagnosis not present

## 2022-05-26 DIAGNOSIS — K2100 Gastro-esophageal reflux disease with esophagitis, without bleeding: Secondary | ICD-10-CM | POA: Diagnosis not present

## 2022-05-26 MED ORDER — METOPROLOL SUCCINATE ER 50 MG PO TB24: ORAL_TABLET | ORAL | 3 refills | Status: DC

## 2022-05-26 NOTE — Progress Notes (Signed)
I,Jana Robinson,acting as a scribe for [name], MD.,have documented all relevant documentation on the behalf of [name], MD,as directed by  [name], MD while in the presence of [name], MD.   Established patient visit   Patient: Ana Ryan   DOB: 1956/02/12   66 y.o. Female  MRN: [id number] Visit Date: 05/26/2022  Today's healthcare provider: [name], MD    Subjective    HPI  Overall patient feels well and has no complaints. She needs refill of her medications .  Hypertension, follow-up  BP Readings from Last 3 Encounters:  05/26/22 (!) 128/94  10/06/21 (!) 142/86  08/19/20 (!) 146/92   Wt Readings from Last 3 Encounters:  05/26/22 155 lb 12.8 oz (70.7 kg)  10/06/21 151 lb (68.5 kg)  08/19/20 155 lb 3.2 oz (70.4 kg)     She was last seen for hypertension 8 months ago.  Management since that visit includes; Follow home blood pressure readings on metoprolol.  Outside blood pressures: takes occasionally   ---------------------------------------------------------------------------------------------------   Medications: Outpatient Medications Prior to Visit  Medication Sig   esomeprazole (NEXIUM) 40 MG capsule Take 1 capsule (40 mg total) by mouth daily. No product substitutions"Nexium only"   metoprolol succinate (TOPROL-XL) 50 MG 24 hr tablet TAKE 1 TABLET DAILY WITH OR IMMEDIATELY FOLLOWING A MEAL. Schedule doctor's appointment for future refills   benzonatate (TESSALON) 100 MG capsule Take 1 capsule (100 mg total) by mouth 2 (two) times daily as needed for cough.   No facility-administered medications prior to visit.    Review of Systems  Constitutional:  Negative for appetite change, chills, fatigue and fever.  Respiratory:  Negative for chest tightness and shortness of breath.   Cardiovascular:  Negative for chest pain and palpitations.  Gastrointestinal:  Negative for abdominal pain, nausea and  vomiting.  Neurological:  Negative for dizziness and weakness.    Last metabolic panel Lab Results  Component Value Date   GLUCOSE 113 (H) 02/25/2019   NA 134 (L) 02/25/2019   K 3.5 02/25/2019   CL 109 02/25/2019   CO2 16 (L) 02/25/2019   BUN 31 (H) 02/25/2019   CREATININE 3.01 (H) 02/25/2019   GFRNONAA 16 (L) 02/25/2019   CALCIUM 9.2 02/25/2019   PHOS 4.6 02/02/2019   PROT 7.0 02/01/2019   ALBUMIN 2.4 (L) 02/02/2019   BILITOT 0.3 02/01/2019   ALKPHOS 76 02/01/2019   AST 13 (L) 02/01/2019   ALT 13 02/01/2019   ANIONGAP 9 02/25/2019       Objective    BP (!) 128/94 (BP Location: Right Arm, Patient Position: Sitting, Cuff Size: Normal)   Pulse 60   Temp 98 F (36.7 C) (Oral)   Resp 16   Wt 155 lb 12.8 oz (70.7 kg)   SpO2 96%   BMI 28.50 kg/m  BP Readings from Last 3 Encounters:  05/26/22 (!) 128/94  10/06/21 (!) 142/86  08/19/20 (!) 146/92   Wt Readings from Last 3 Encounters:  05/26/22 155 lb 12.8 oz (70.7 kg)  10/06/21 151 lb (68.5 kg)  08/19/20 155 lb 3.2 oz (70.4 kg)      Physical Exam Vitals reviewed.  Constitutional:      General: She is not in acute distress.    Appearance: She is well-developed.  HENT:     Head: Normocephalic and atraumatic.     Right Ear: Hearing normal.     Left Ear: Hearing normal.  Nose: Nose normal.  Eyes:     General: Lids are normal. No scleral icterus.       Right eye: No discharge.        Left eye: No discharge.     Conjunctiva/sclera: Conjunctivae normal.  Cardiovascular:     Rate and Rhythm: Normal rate and regular rhythm.     Heart sounds: Normal heart sounds.  Pulmonary:     Effort: Pulmonary effort is normal. No respiratory distress.  Skin:    Findings: No lesion or rash.  Neurological:     General: No focal deficit present.     Mental Status: She is alert and oriented to person, place, and time.  Psychiatric:        Mood and Affect: Mood normal.        Speech: Speech normal.        Behavior: Behavior  normal.        Thought Content: Thought content normal.        Judgment: Judgment normal.       No results found for any visits on 05/26/22.  Assessment & Plan     1. Essential hypertension  Controlled.  2. Primary hypertension   3. Gastroesophageal reflux disease with esophagitis without hemorrhage Patient states she needs PPI  4. Interstitial nephritis Follow renal panel  5. Hypercholesteremia     No follow-ups on file.      I, [name], MD, have reviewed all documentation for this visit. The documentation on 06/05/22 for the exam, diagnosis, procedures, and orders are all accurate and complete.    Lexani Corona Wendelyn Breslow, MD  [facility] [phone] (phone) [phone] (fax)  [facility]

## 2022-05-26 NOTE — Patient Instructions (Signed)
Come to physical fasting.

## 2022-11-29 ENCOUNTER — Encounter: Payer: BC Managed Care – PPO | Admitting: Family Medicine

## 2023-02-03 ENCOUNTER — Other Ambulatory Visit: Payer: Self-pay | Admitting: Family Medicine

## 2023-02-03 DIAGNOSIS — I10 Essential (primary) hypertension: Secondary | ICD-10-CM

## 2023-02-03 MED ORDER — METOPROLOL SUCCINATE ER 50 MG PO TB24: ORAL_TABLET | ORAL | 0 refills | Status: DC

## 2023-02-03 NOTE — Telephone Encounter (Signed)
Medication Refill - Medication: metoprolol succinate (TOPROL-XL) 50 MG 24 hr table   Has the patient contacted their pharmacy? Yes.   Pt was referred to PCP. Pt use to be a pt of Dr. Marlan Palau, pt has no refills for medication and is out. Pt did schedule an appointment with Dr. Quentin Cornwall 02/24/23 @ 11am.  Pt is wanting to see if she can get a short supply of medication until her appt.    Preferred Pharmacy (with phone number or street name):  [facility] Phone: [phone]  Fax: [phone]     Has the patient been seen for an appointment in the last year OR does the patient have an upcoming appointment? Yes.    Agent: Please be advised that RX refills may take up to 3 business days. We ask that you follow-up with your pharmacy.

## 2023-02-03 NOTE — Telephone Encounter (Signed)
Requested Prescriptions  Pending Prescriptions Disp Refills   metoprolol succinate (TOPROL-XL) 50 MG 24 hr tablet 90 tablet 0    Sig: Take with or immediately following a meal.     Cardiovascular:  Beta Blockers Failed - 02/03/2023 12:57 PM      Failed - Last BP in normal range    BP Readings from Last 1 Encounters:  05/26/22 (!) 128/94         Failed - Valid encounter within last 6 months    Recent Outpatient Visits           8 months ago Essential hypertension   [place] Eulas Post, MD   1 year ago Ceruminosis, right   [facility] Eulas Post, MD   2 years ago Essential hypertension   [place] Eulas Post, MD   3 years ago AKI (acute kidney injury) [facility])   [place] Eulas Post, MD   4 years ago Urinary tract infection with hematuria, site unspecified   [place] Eulas Post, MD       Future Appointments             In 3 weeks Simmons-Robinson, Riki Sheer, MD [facility]            Passed - Last Heart Rate in normal range    Pulse Readings from Last 1 Encounters:  05/26/22 60

## 2023-02-24 ENCOUNTER — Ambulatory Visit: Payer: BC Managed Care – PPO | Admitting: Family Medicine

## 2023-04-07 DIAGNOSIS — I10 Essential (primary) hypertension: Secondary | ICD-10-CM | POA: Diagnosis not present

## 2023-04-07 DIAGNOSIS — R739 Hyperglycemia, unspecified: Secondary | ICD-10-CM | POA: Diagnosis not present

## 2023-04-07 DIAGNOSIS — E7800 Pure hypercholesterolemia, unspecified: Secondary | ICD-10-CM | POA: Diagnosis not present

## 2023-04-07 DIAGNOSIS — F419 Anxiety disorder, unspecified: Secondary | ICD-10-CM | POA: Diagnosis not present

## 2024-10-30 ENCOUNTER — Emergency Department (HOSPITAL_COMMUNITY)

## 2024-10-30 ENCOUNTER — Encounter (HOSPITAL_COMMUNITY): Payer: Self-pay

## 2024-10-30 ENCOUNTER — Other Ambulatory Visit: Payer: Self-pay

## 2024-10-30 ENCOUNTER — Inpatient Hospital Stay (HOSPITAL_COMMUNITY)
Admission: EM | Admit: 2024-10-30 | Discharge: 2024-11-06 | DRG: 682 | Disposition: A | Discharge status: Home Health | Attending: Emergency Medicine | Admitting: Emergency Medicine

## 2024-10-30 DIAGNOSIS — N184 Chronic kidney disease, stage 4 (severe): Secondary | ICD-10-CM | POA: Diagnosis present

## 2024-10-30 DIAGNOSIS — Z96641 Presence of right artificial hip joint: Secondary | ICD-10-CM | POA: Diagnosis present

## 2024-10-30 DIAGNOSIS — I10 Essential (primary) hypertension: Secondary | ICD-10-CM

## 2024-10-30 DIAGNOSIS — Z8379 Family history of other diseases of the digestive system: Secondary | ICD-10-CM

## 2024-10-30 DIAGNOSIS — Z83438 Family history of other disorder of lipoprotein metabolism and other lipidemia: Secondary | ICD-10-CM

## 2024-10-30 DIAGNOSIS — Z6828 Body mass index (BMI) 28.0-28.9, adult: Secondary | ICD-10-CM

## 2024-10-30 DIAGNOSIS — Z79899 Other long term (current) drug therapy: Secondary | ICD-10-CM

## 2024-10-30 DIAGNOSIS — F1011 Alcohol abuse, in remission: Secondary | ICD-10-CM | POA: Diagnosis not present

## 2024-10-30 DIAGNOSIS — E663 Overweight: Secondary | ICD-10-CM | POA: Diagnosis present

## 2024-10-30 DIAGNOSIS — R531 Weakness: Secondary | ICD-10-CM

## 2024-10-30 DIAGNOSIS — N2889 Other specified disorders of kidney and ureter: Secondary | ICD-10-CM | POA: Diagnosis present

## 2024-10-30 DIAGNOSIS — R8271 Bacteriuria: Secondary | ICD-10-CM | POA: Diagnosis present

## 2024-10-30 DIAGNOSIS — N179 Acute kidney failure, unspecified: Principal | ICD-10-CM | POA: Diagnosis present

## 2024-10-30 DIAGNOSIS — D539 Nutritional anemia, unspecified: Secondary | ICD-10-CM | POA: Diagnosis present

## 2024-10-30 DIAGNOSIS — Z8261 Family history of arthritis: Secondary | ICD-10-CM

## 2024-10-30 DIAGNOSIS — J479 Bronchiectasis, uncomplicated: Secondary | ICD-10-CM

## 2024-10-30 DIAGNOSIS — R7401 Elevation of levels of liver transaminase levels: Secondary | ICD-10-CM | POA: Diagnosis present

## 2024-10-30 DIAGNOSIS — K219 Gastro-esophageal reflux disease without esophagitis: Secondary | ICD-10-CM | POA: Diagnosis present

## 2024-10-30 DIAGNOSIS — J45909 Unspecified asthma, uncomplicated: Secondary | ICD-10-CM | POA: Diagnosis present

## 2024-10-30 DIAGNOSIS — Z820 Family history of epilepsy and other diseases of the nervous system: Secondary | ICD-10-CM

## 2024-10-30 DIAGNOSIS — J180 Bronchopneumonia, unspecified organism: Secondary | ICD-10-CM | POA: Diagnosis present

## 2024-10-30 DIAGNOSIS — Z885 Allergy status to narcotic agent status: Secondary | ICD-10-CM

## 2024-10-30 DIAGNOSIS — E038 Other specified hypothyroidism: Secondary | ICD-10-CM | POA: Diagnosis present

## 2024-10-30 DIAGNOSIS — Z8679 Personal history of other diseases of the circulatory system: Secondary | ICD-10-CM

## 2024-10-30 DIAGNOSIS — N3000 Acute cystitis without hematuria: Secondary | ICD-10-CM | POA: Diagnosis not present

## 2024-10-30 DIAGNOSIS — Z87891 Personal history of nicotine dependence: Secondary | ICD-10-CM

## 2024-10-30 DIAGNOSIS — J470 Bronchiectasis with acute lower respiratory infection: Secondary | ICD-10-CM | POA: Diagnosis present

## 2024-10-30 DIAGNOSIS — D631 Anemia in chronic kidney disease: Secondary | ICD-10-CM | POA: Diagnosis present

## 2024-10-30 DIAGNOSIS — I959 Hypotension, unspecified: Secondary | ICD-10-CM | POA: Diagnosis present

## 2024-10-30 DIAGNOSIS — N189 Chronic kidney disease, unspecified: Secondary | ICD-10-CM

## 2024-10-30 DIAGNOSIS — I129 Hypertensive chronic kidney disease with stage 1 through stage 4 chronic kidney disease, or unspecified chronic kidney disease: Secondary | ICD-10-CM | POA: Diagnosis present

## 2024-10-30 DIAGNOSIS — E871 Hypo-osmolality and hyponatremia: Secondary | ICD-10-CM | POA: Diagnosis present

## 2024-10-30 DIAGNOSIS — F1010 Alcohol abuse, uncomplicated: Secondary | ICD-10-CM | POA: Diagnosis present

## 2024-10-30 LAB — CBC
HCT: 26.7 % — ABNORMAL LOW (ref 36.0–46.0)
Hemoglobin: 8.3 g/dL — ABNORMAL LOW (ref 12.0–15.0)
MCH: 31.9 pg (ref 26.0–34.0)
MCHC: 31.1 g/dL (ref 30.0–36.0)
MCV: 102.7 fL — ABNORMAL HIGH (ref 80.0–100.0)
Platelets: 215 K/uL (ref 150–400)
RBC: 2.6 MIL/uL — ABNORMAL LOW (ref 3.87–5.11)
RDW: 15 % (ref 11.5–15.5)
WBC: 6.4 K/uL (ref 4.0–10.5)
nRBC: 0.6 % — ABNORMAL HIGH (ref 0.0–0.2)

## 2024-10-30 LAB — D-DIMER, QUANTITATIVE: D-Dimer, Quant: 0.65 ug{FEU}/mL — ABNORMAL HIGH (ref 0.00–0.50)

## 2024-10-30 LAB — BRAIN NATRIURETIC PEPTIDE: B Natriuretic Peptide: 149.5 pg/mL — ABNORMAL HIGH (ref 0.0–100.0)

## 2024-10-30 LAB — COMPREHENSIVE METABOLIC PANEL WITH GFR
ALT: 70 U/L — ABNORMAL HIGH (ref 0–44)
AST: 76 U/L — ABNORMAL HIGH (ref 15–41)
Albumin: 2.1 g/dL — ABNORMAL LOW (ref 3.5–5.0)
Alkaline Phosphatase: 171 U/L — ABNORMAL HIGH (ref 38–126)
Anion gap: 14 (ref 5–15)
BUN: 22 mg/dL (ref 8–23)
CO2: 19 mmol/L — ABNORMAL LOW (ref 22–32)
Calcium: 8.1 mg/dL — ABNORMAL LOW (ref 8.9–10.3)
Chloride: 95 mmol/L — ABNORMAL LOW (ref 98–111)
Creatinine, Ser: 2.96 mg/dL — ABNORMAL HIGH (ref 0.44–1.00)
GFR, Estimated: 17 mL/min — ABNORMAL LOW (ref >60)
Glucose, Bld: 117 mg/dL — ABNORMAL HIGH (ref 70–99)
Potassium: 4.4 mmol/L (ref 3.5–5.1)
Sodium: 128 mmol/L — ABNORMAL LOW (ref 135–145)
Total Bilirubin: 2.2 mg/dL — ABNORMAL HIGH (ref 0.0–1.2)
Total Protein: 4.5 g/dL — ABNORMAL LOW (ref 6.5–8.1)

## 2024-10-30 LAB — ETHANOL: Alcohol, Ethyl (B): <15 mg/dL (ref <15)

## 2024-10-30 LAB — URINALYSIS, W/ REFLEX TO CULTURE (INFECTION SUSPECTED)
Bilirubin Urine: NEGATIVE
Glucose, UA: NEGATIVE mg/dL
Hgb urine dipstick: NEGATIVE
Ketones, ur: NEGATIVE mg/dL
Nitrite: NEGATIVE
Protein, ur: NEGATIVE mg/dL
Specific Gravity, Urine: 1.011 (ref 1.005–1.030)
WBC, UA: >50 WBC/hpf (ref 0–5)
pH: 5 (ref 5.0–8.0)

## 2024-10-30 LAB — TROPONIN I (HIGH SENSITIVITY)
Troponin I (High Sensitivity): 14 ng/L (ref <18)
Troponin I (High Sensitivity): 14 ng/L (ref <18)

## 2024-10-30 LAB — PROTIME-INR
INR: 1.1 (ref 0.8–1.2)
Prothrombin Time: 15 s (ref 11.4–15.2)

## 2024-10-30 LAB — LIPASE, BLOOD: Lipase: 31 U/L (ref 11–51)

## 2024-10-30 MED ORDER — SODIUM CHLORIDE 0.9 % IV SOLN
1.0000 g | Freq: Once | INTRAVENOUS | Status: AC
  Administered 2024-10-30: 1 g via INTRAVENOUS
  Filled 2024-10-30: qty 10

## 2024-10-30 MED ORDER — MELATONIN 3 MG PO TABS
3.0000 mg | ORAL_TABLET | Freq: Every evening | ORAL | Status: DC | PRN
  Administered 2024-11-02 – 2024-11-05 (×3): 3 mg via ORAL
  Filled 2024-10-30 (×3): qty 1

## 2024-10-30 MED ORDER — SODIUM CHLORIDE 0.9 % IV BOLUS
1000.0000 mL | Freq: Once | INTRAVENOUS | Status: AC
  Administered 2024-10-31: 1000 mL via INTRAVENOUS

## 2024-10-30 MED ORDER — ACETAMINOPHEN 325 MG PO TABS: 650.0000 mg | ORAL_TABLET | Freq: Four times a day (QID) | ORAL | Status: DC | PRN

## 2024-10-30 MED ORDER — ACETAMINOPHEN 650 MG RE SUPP: 650.0000 mg | Freq: Four times a day (QID) | RECTAL | Status: DC | PRN

## 2024-10-30 MED ORDER — ONDANSETRON HCL 4 MG/2ML IJ SOLN
4.0000 mg | Freq: Four times a day (QID) | INTRAMUSCULAR | Status: DC | PRN
  Administered 2024-11-05: 4 mg via INTRAVENOUS
  Filled 2024-10-30: qty 2

## 2024-10-30 MED ORDER — LACTATED RINGERS IV SOLN: INTRAVENOUS | Status: AC

## 2024-10-30 NOTE — ED Notes (Signed)
 Patient transported to CT

## 2024-10-30 NOTE — ED Triage Notes (Signed)
 Pt c.o SOB x 3 weeks with productive cough. Pt also c.o intermittent diarrhea.

## 2024-10-30 NOTE — ED Provider Notes (Signed)
 [place] EMERGENCY DEPARTMENT AT [facility] Provider Note  CSN: [id number] Arrival date & time: 10/30/24 1902  Chief Complaint(s) Shortness of Breath and Diarrhea  HPI Ana Ryan is a 68 y.o. female history of alcohol use, hypertension presenting to the emergency department shortness of breath.  Patient reports shortness of breath which has been progressive over the past few weeks to months.  She reports she gets fatigued with even minimal exertion.  Denies orthopnea.  Reports dry cough, no sputum.  No fevers or chills.  No chest pain or back pain.  No abdominal pain.  She reports some diarrhea as well.  No blood in her stool.  Patient was brought in by family today, they were concerned that she had possibly relapsed on alcohol and was not caring for herself, reports house was in disarray   Past Medical History Past Medical History:  Diagnosis Date   ARF (acute renal failure) 01/2019   GERD (gastroesophageal reflux disease)    H/O seasonal allergies    uses OTC meds as needed, eyes waters, sneezes   Hypertension    Incarcerated incisional hernia 04/13/2016   Patient Active Problem List   Diagnosis Date Noted   Interstitial nephritis 03/12/2019   Metabolic acidosis 02/01/2019   Acute renal failure (ARF) 02/01/2019   Hyperglycemia 02/01/2019   Alcohol abuse 02/01/2019   Incarcerated incisional hernia 04/13/2016   Narrowing of intervertebral disc space 07/06/2015   Spondylosis 07/06/2015   Ex-cigarette smoker 07/06/2015   BP (high blood pressure) 07/06/2015   Adaptive colitis 07/06/2015   Mild major depression 07/06/2015   Asymptomatic postmenopausal status 07/06/2015   Overweight 07/06/2015   Cannot sleep 06/30/2010   Anxiety 02/09/2010   Esophagitis, reflux 03/10/2008   Hypercholesteremia 03/10/2008   Home Medication(s) Prior to Admission medications   Medication Sig Start Date End Date Taking? Authorizing Provider  benzonatate  (TESSALON ) 100 MG capsule  Take 1 capsule (100 mg total) by mouth 2 (two) times daily as needed for cough. 12/03/21   Mecum, Erin E, PA-C  esomeprazole  (NEXIUM ) 40 MG capsule Take 1 capsule (40 mg total) by mouth daily. No product substitutionsNexium only 05/07/20   Bertrum Charlie CROME, MD  metoprolol  succinate (TOPROL -XL) 50 MG 24 hr tablet Take with or immediately following a meal. 02/03/23   Simmons-Robinson, Rockie, MD                                                                                                                                    Past Surgical History Past Surgical History:  Procedure Laterality Date   ABDOMINAL HYSTERECTOMY     APPENDECTOMY     CESAREAN SECTION     x3   INCISIONAL HERNIA REPAIR N/A 04/13/2016   Procedure: LAPAROSCOPIC INCISIONAL HERNIA WITH MESH ;  Surgeon: Elon Pacini, MD;  Location: [facility];  Service: General;  Laterality: N/A;   INSERTION OF MESH N/A 04/13/2016   Procedure:  INSERTION OF MES,lysis of adhesions;  Surgeon: Elon Pacini, MD;  Location: [facility];  Service: General;  Laterality: N/A;   TOE SURGERY Right    TOTAL HIP ARTHROPLASTY Right    0'7988; 2016   WISDOM TOOTH EXTRACTION     Family History Family History  Problem Relation Age of Onset   Hyperlipidemia Mother    Osteoarthritis Father    Parkinson's disease Father    Thyroid disease Son    Kidney disease Neg Hx     Social History Social History   Tobacco Use   Smoking status: Former   Smokeless tobacco: Never   Tobacco comments:    did not smoke regularly  Vaping Use   Vaping status: Never Used  Substance Use Topics   Alcohol use: Yes    Comment: h/o heavy use   Drug use: No   Allergies Dilaudid [hydromorphone hcl]  Review of Systems Review of Systems  All other systems reviewed and are negative.   Physical Exam Vital Signs  I have reviewed the triage vital signs BP (!) 88/52   Pulse 79   Temp 98.2 F (36.8 C) (Oral)   Resp 19   Ht 5' 2 (1.575 m)   Wt 70.7 kg   SpO2 99%    BMI 28.51 kg/m  Physical Exam Vitals and nursing note reviewed.  Constitutional:      General: She is not in acute distress.    Appearance: She is well-developed.  HENT:     Head: Normocephalic and atraumatic.     Mouth/Throat:     Mouth: Mucous membranes are moist.  Eyes:     Pupils: Pupils are equal, round, and reactive to light.  Cardiovascular:     Rate and Rhythm: Normal rate and regular rhythm.     Heart sounds: No murmur heard. Pulmonary:     Effort: Pulmonary effort is normal. No respiratory distress.     Breath sounds: Examination of the right-lower field reveals rales. Examination of the left-lower field reveals rales. Rales present.  Abdominal:     General: Abdomen is flat.     Palpations: Abdomen is soft.     Tenderness: There is no abdominal tenderness.  Musculoskeletal:        General: No tenderness.     Right lower leg: Edema present.     Left lower leg: Edema present.  Skin:    General: Skin is warm and dry.  Neurological:     General: No focal deficit present.     Mental Status: She is alert. Mental status is at baseline.  Psychiatric:        Mood and Affect: Mood normal.        Behavior: Behavior normal.     ED Results and Treatments Labs (all labs ordered are listed, but only abnormal results are displayed) Labs Reviewed  COMPREHENSIVE METABOLIC PANEL WITH GFR - Abnormal; Notable for the following components:      Result Value   Sodium 128 (*)    Chloride 95 (*)    CO2 19 (*)    Glucose, Bld 117 (*)    Creatinine, Ser 2.96 (*)    Calcium  8.1 (*)    Total Protein 4.5 (*)    Albumin 2.1 (*)    AST 76 (*)    ALT 70 (*)    Alkaline Phosphatase 171 (*)    Total Bilirubin 2.2 (*)    GFR, Estimated 17 (*)    All other components within normal  limits  D-DIMER, QUANTITATIVE - Abnormal; Notable for the following components:   D-Dimer, Quant 0.65 (*)    All other components within normal limits  BRAIN NATRIURETIC PEPTIDE - Abnormal; Notable for the  following components:   B Natriuretic Peptide 149.5 (*)    All other components within normal limits  URINALYSIS, W/ REFLEX TO CULTURE (INFECTION SUSPECTED) - Abnormal; Notable for the following components:   Color, Urine AMBER (*)    APPearance CLOUDY (*)    Leukocytes,Ua LARGE (*)    Bacteria, UA MANY (*)    All other components within normal limits  CBC - Abnormal; Notable for the following components:   RBC 2.60 (*)    Hemoglobin 8.3 (*)    HCT 26.7 (*)    MCV 102.7 (*)    nRBC 0.6 (*)    All other components within normal limits  URINE CULTURE  ETHANOL  LIPASE, BLOOD  PROTIME-INR  CBC WITH DIFFERENTIAL/PLATELET  TROPONIN I (HIGH SENSITIVITY)  TROPONIN I (HIGH SENSITIVITY)                                                                                                                          Radiology CT CHEST WO CONTRAST Result Date: 10/30/2024 CLINICAL DATA:  Shortness of breath productive cough EXAM: CT CHEST WITHOUT CONTRAST TECHNIQUE: Multidetector CT imaging of the chest was performed following the standard protocol without IV contrast. RADIATION DOSE REDUCTION: This exam was performed according to the departmental dose-optimization program which includes automated exposure control, adjustment of the mA and/or kV according to patient size and/or use of iterative reconstruction technique. COMPARISON:  Chest x-ray 10/30/2024, CT 05/29/2019 FINDINGS: Cardiovascular: Limited without intravenous contrast. Normal cardiac size. No pericardial effusion. Nonaneurysmal aorta Mediastinum/Nodes: Patent trachea. No thyroid mass. No suspicious lymph nodes. Esophagus within normal limits. Lungs/Pleura: Mild bronchiectasis within the bilateral lungs. Right greater than left subpleural reticulations and bandlike densities with areas of mild distortion, favored to represent post infectious or post inflammatory scarring. No acute confluent airspace disease, pleural effusion or pneumothorax.  Scattered small solid pulmonary nodules measuring less than 5 mm, for example punctate 2 mm superior left lower lobe pulmonary nodule on series 3, image 62. 3 mm right upper lobe pulmonary nodule on series 3, image 61. Upper Abdomen: Elevation of the right diaphragm. Marked low-density liver consistent with steatosis. The liver is enlarged, measuring at least 19 cm craniocaudal with the inferior extent non included. The kidneys are atrophic. Complex lesions within the bilateral kidneys, on the right measuring 16 mm and on the left measuring 12 mm. Musculoskeletal: No acute osseous abnormality. IMPRESSION: 1. Mild bronchiectasis within the bilateral lungs with right greater than left subpleural reticulations and bandlike densities and areas of mild distortion, favored to represent post infectious or post inflammatory scarring. No acute confluent airspace disease. 2. Scattered small solid pulmonary nodules measuring less than 5 mm. No follow-up needed if patient is low-risk (and has no known or suspected primary neoplasm). Non-contrast  chest CT can be considered in 12 months if patient is high-risk. This recommendation follows the consensus statement: Guidelines for Management of Incidental Pulmonary Nodules Detected on CT Images: From the Fleischner Society 2017; Radiology 2017; 284:228-243. 3. Hepatomegaly with marked hepatic steatosis. 4. Atrophic kidneys with suspected complex lesions within the bilateral kidneys, incompletely characterized on this noncontrast study and also incompletely visualized. Consider initial evaluation with nonemergent renal ultrasound though renal CT or MRI might ultimately be required. Electronically Signed   By: Luke Bun M.D.   On: 10/30/2024 21:44   DG Chest Portable 1 View Result Date: 10/30/2024 EXAM: 1 VIEW(S) XRAY OF THE CHEST 10/30/2024 08:30:00 PM COMPARISON: Comparison with 02/01/2019. CLINICAL HISTORY: shob shob FINDINGS: LUNGS AND PLEURA: Streak opacities in the right  mid and lower lung and left lower lung favor atelectasis or scarring. No pleural effusion. No pneumothorax. HEART AND MEDIASTINUM: Stable cardiomediastinal silhouette. BONES AND SOFT TISSUES: No acute osseous abnormality. IMPRESSION: 1. Streaky opacities in the right mid/lower and left lower lung, favoring atelectasis or scarring. Infiltrates not excluded. Electronically signed by: Norman Gatlin MD 10/30/2024 08:32 PM EST RP Workstation: [id number]    Pertinent labs & imaging results that were available during my care of the patient were reviewed by me and considered in my medical decision making (see MDM for details).  Medications Ordered in ED Medications  sodium chloride  0.9 % bolus 1,000 mL (has no administration in time range)  cefTRIAXone  (ROCEPHIN ) 1 g in sodium chloride  0.9 % 100 mL IVPB (1 g Intravenous New Bag/Given 10/30/24 2305)                                                                                                                                     Procedures Procedures  (including critical care time)  Medical Decision Making / ED Course   MDM:  68 year old presenting to the emergency department with cough, shortness of breath.  Patient generally well-appearing, initial vital signs notable for mild hypotension although resolved on recheck.  Differential includes pneumonia, CHF, COPD, pulmonary embolism, ACS, anemia, acidosis.  Will check chest x-ray, labs including D-dimer, BNP and troponin.  Will reassess.  Clinical Course as of 10/30/24 2349  Wed Oct 30, 2024  2347 Workup notable for bronchiectasis seen on CT which may be the cause of the patient's shortness of breath.  Labs also notable for possible AKI on CKD, creatinine around 1 year ago was around 1.8.  UA also concerning for UTI.  Given age and weakness, feel patient would probably be best served by admission.  Discussed with Dr. Marcene who will admit the patient. [WS]    Clinical Course User Index [WS]  Francesca Elsie CROME, MD     Additional history obtained: -Additional history obtained from ems -External records from outside source obtained and reviewed including: Chart review including previous notes, labs, imaging, consultation notes including prior notes    Lab Tests: -I  ordered, reviewed, and interpreted labs.   The pertinent results include:   Labs Reviewed  COMPREHENSIVE METABOLIC PANEL WITH GFR - Abnormal; Notable for the following components:      Result Value   Sodium 128 (*)    Chloride 95 (*)    CO2 19 (*)    Glucose, Bld 117 (*)    Creatinine, Ser 2.96 (*)    Calcium  8.1 (*)    Total Protein 4.5 (*)    Albumin 2.1 (*)    AST 76 (*)    ALT 70 (*)    Alkaline Phosphatase 171 (*)    Total Bilirubin 2.2 (*)    GFR, Estimated 17 (*)    All other components within normal limits  D-DIMER, QUANTITATIVE - Abnormal; Notable for the following components:   D-Dimer, Quant 0.65 (*)    All other components within normal limits  BRAIN NATRIURETIC PEPTIDE - Abnormal; Notable for the following components:   B Natriuretic Peptide 149.5 (*)    All other components within normal limits  URINALYSIS, W/ REFLEX TO CULTURE (INFECTION SUSPECTED) - Abnormal; Notable for the following components:   Color, Urine AMBER (*)    APPearance CLOUDY (*)    Leukocytes,Ua LARGE (*)    Bacteria, UA MANY (*)    All other components within normal limits  CBC - Abnormal; Notable for the following components:   RBC 2.60 (*)    Hemoglobin 8.3 (*)    HCT 26.7 (*)    MCV 102.7 (*)    nRBC 0.6 (*)    All other components within normal limits  URINE CULTURE  ETHANOL  LIPASE, BLOOD  PROTIME-INR  CBC WITH DIFFERENTIAL/PLATELET  TROPONIN I (HIGH SENSITIVITY)  TROPONIN I (HIGH SENSITIVITY)    Notable for see MDM  EKG   EKG Interpretation Date/Time:  Wednesday October 30 2024 19:12:23 EST Ventricular Rate:  80 PR Interval:  168 QRS Duration:  70 QT Interval:  376 QTC Calculation: 433 R  Axis:   33  Text Interpretation: Normal sinus rhythm Cannot rule out Anterior infarct , age undetermined Abnormal ECG Confirmed by Francesca Fallow ([id number]) on 10/30/2024 7:57:35 PM         Imaging Studies ordered: I ordered imaging studies including CT chest On my interpretation imaging demonstrates bronchiectasis  I independently visualized and interpreted imaging. I agree with the radiologist interpretation   Medicines ordered and prescription drug management: Meds ordered this encounter  Medications   cefTRIAXone  (ROCEPHIN ) 1 g in sodium chloride  0.9 % 100 mL IVPB    Antibiotic Indication::   UTI   sodium chloride  0.9 % bolus 1,000 mL    -I have reviewed the patients home medicines and have made adjustments as needed   Consultations Obtained: I requested consultation with the hospitalist,  and discussed lab and imaging findings as well as pertinent plan - they recommend: admission   Cardiac Monitoring: The patient was maintained on a cardiac monitor.  I personally viewed and interpreted the cardiac monitored which showed an underlying rhythm of: NSR  Social Determinants of Health:  Diagnosis or treatment significantly limited by social determinants of health: alcohol use and lives alone   Reevaluation: After the interventions noted above, I reevaluated the patient and found that their symptoms have improved  Co morbidities that complicate the patient evaluation  Past Medical History:  Diagnosis Date   ARF (acute renal failure) 01/2019   GERD (gastroesophageal reflux disease)    H/O seasonal allergies    uses  OTC meds as needed, eyes waters, sneezes   Hypertension    Incarcerated incisional hernia 04/13/2016      Dispostion: Disposition decision including need for hospitalization was considered, and patient admitted to the hospital.    Final Clinical Impression(s) / ED Diagnoses Final diagnoses:  Acute cystitis without hematuria  Acute kidney injury  superimposed on CKD  Generalized weakness  Bronchiectasis without complication (HCC)     This chart was dictated using voice recognition software.  Despite best efforts to proofread,  errors can occur which can change the documentation meaning.    Francesca Elsie CROME, MD 10/30/24 [phone]

## 2024-10-30 NOTE — H&P (Signed)
 History and Physical      Ana Ryan FMW:[id number] DOB: 09/02/1956 DOA: 10/30/2024; DOS: 10/30/2024  PCP: Bertrum Charlie CROME, MD  Patient coming from: home   I have personally briefly reviewed patient's old medical records in [facility]  Chief Complaint: Generalized weakness  HPI: Ana Ryan is a 68 y.o. female with medical history significant for essential hypertension, CKD stage IV with baseline creatinine 1.8-2.5, who is admitted to [facility] on 10/30/2024 with generalized weakness after presenting from home to [facility] ED complaining of generalized weakness.   The patient reports generalized weakness over the last 4 to 5 days, in the absence of any associated acute focal weakness.  She lives at home by herself, and notes that her generalized weakness over the last few days, has resulted in diminished ability for her to independently perform her ADLs, which, she is capable of doing at baseline.  The setting of her generalized weakness, she notes decline in oral intake of both food and water  over the last few days.  Denies any recent abdominal discomfort.   Patient has reported history of prior alcohol abuse, but had more recently been abstinent of alcohol.  However, the patient's family went to check on her today, and noted that there was alcohol in the patient's house, which is unusual for her after she stopped drinking.  Family also noted that the patient's house was not as tidy as it typically is, and in the context of the patient's report of recent generalized weakness, family brought the patient to [facility] emergency department for further evaluation management of the above.  Her medical history includes CKD stage IV with baseline creatinine 1.8-2.5, with most recent prior creatinine data point noted to be 1.8 on 09/08/2023.  Additionally, per chart review, most recent prior liver enzymes were checked on 09/08/2023, and were notable for the following values at that  time: Alkaline phosphatase 78, total bilirubin 0.4, AST 15, ALT 16.  The patient reports a chronic nonproductive cough, also notes that she has chronic mild shortness of breath, without any significant change in the symptoms over the course of the last week.  Denies any recent chest pain.     ED Course:  Vital signs in the ED were notable for the following: Afebrile; heart rates in the 70s to 80s; systolic blood pressures in the high 80s to low 100s; respiratory rate 15-21, oxygen saturation 94 to 100% on room air.  Labs were notable for the following: CMP was notable for the following: Sodium 128 compared to most recent prior value of 137 in September 2024, potassium 4.4, chloride 95, BUN 22, creatinine 2.96, glucose 117, calcium  adjusted for mild hypoalbuminemia noted to be 9.6, avidin 2.1, alkaline phosphatase 171, total bilirubin 2.2, AST 76, ALT 70.  Lipase 31.  BNP 149.5 without a prior BNP data point available for point comparison.  High sensitive troponin I x 2 values are both out of the 14.  D-dimer 0.65.  Serum ethanol level less than 15.  CBC notable for will with cell count 6400, hemoglobin 8.46 with macrocytic and normochromic findings and relative to most recent prior hemoglobin value of 13.7 in September 2024, platelet count 2.5.  INR 1.1.  Urinalysis was SOSi with a cloudy appearing specimen it was notable for greater than 50 white blood cells, many bacteria, large leukocyte esterase, no evidence of succumbs epithelial cells, and no hemoglobin as well as no RBCs.  Urine culture collected prior to initiation of  IV antibiotics.  Per my interpretation, EKG in ED demonstrated the following: In comparison to most recent prior EKG from 02/02/2019, today's EKG shows sinus rhythm with heart rate 80, normal intervals, nonspecific T wave inversions in leads V1 through V3, of which T wave inversion in V1 appears unchanged from most recent prior EKG, demonstrate no evidence of ST changes, including no  evidence of ST elevation.  Imaging in the ED, per corresponding formal radiology read, was notable for the following: 1 view chest x-ray showed streaky opacities in the right middle and lower lung as well as the left lower lung, favoring atelectasis, without overt evidence of infiltrate, edema, effusion, or pneumothorax.  CT chest without contrast showed mild bronchiectasis within the bilateral lungs, with right greater than left subpleural reticulations, favored to represent postinfectious or postinflammatory scarring, without evidence of airspace disease, including no evidence of infiltrate, edema, effusion, or pneumothorax.  TTE chest also showed hepatomegaly with hepatic steatosis, without characterization of gallbladder or biliary ducts.  CT abdomen/pelvis also showed atrophic kidneys with suspected complex lesions, within the bilateral kidneys, although the kidneys were incompletely characterized on this imaging.  While in the ED, the following were administered: Rocephin , normal saline x 1 other bolus.  Subsequently, the patient was admitted for further evaluation management of presenting generalized weakness in the setting of acute cystitis, with labs also notable for acute kidney injury superimposed on CKD stage IV, transaminitis, new diagnosis of hyponatremia, as well as new diagnosis of macrocytic anemia.     Review of Systems: As per HPI otherwise 10 point review of systems negative.   Past Medical History:  Diagnosis Date   ARF (acute renal failure) 01/2019   GERD (gastroesophageal reflux disease)    H/O seasonal allergies    uses OTC meds as needed, eyes waters, sneezes   Hypertension    Incarcerated incisional hernia 04/13/2016    Past Surgical History:  Procedure Laterality Date   ABDOMINAL HYSTERECTOMY     APPENDECTOMY     CESAREAN SECTION     x3   INCISIONAL HERNIA REPAIR N/A 04/13/2016   Procedure: LAPAROSCOPIC INCISIONAL HERNIA WITH MESH ;  Surgeon: Elon Pacini, MD;   Location: [facility] ORS;  Service: General;  Laterality: N/A;   INSERTION OF MESH N/A 04/13/2016   Procedure: INSERTION OF MES,lysis of adhesions;  Surgeon: Elon Pacini, MD;  Location: [facility] ORS;  Service: General;  Laterality: N/A;   TOE SURGERY Right    TOTAL HIP ARTHROPLASTY Right    0'7988; 2016   WISDOM TOOTH EXTRACTION      Social History:  reports that she has quit smoking. She has never used smokeless tobacco. She reports current alcohol use. She reports that she does not use drugs.   Allergies  Allergen Reactions   Dilaudid [Hydromorphone Hcl] Other (See Comments)    hallucinations    Family History  Problem Relation Age of Onset   Hyperlipidemia Mother    Osteoarthritis Father    Parkinson's disease Father    Thyroid disease Son    Kidney disease Neg Hx     Family history reviewed and not pertinent    Prior to Admission medications   Medication Sig Start Date End Date Taking? Authorizing Provider  benzonatate  (TESSALON ) 100 MG capsule Take 1 capsule (100 mg total) by mouth 2 (two) times daily as needed for cough. 12/03/21   Mecum, Erin E, PA-C  esomeprazole  (NEXIUM ) 40 MG capsule Take 1 capsule (40 mg total) by mouth daily. No product  substitutionsNexium only 05/07/20   Bertrum Charlie CROME, MD  metoprolol  succinate (TOPROL -XL) 50 MG 24 hr tablet Take with or immediately following a meal. 02/03/23   Simmons-Robinson, Rockie, MD     Objective    Physical Exam: Vitals:   10/30/24 2230 10/30/24 2249 10/30/24 2300 10/30/24 2332  BP: 99/60  (!) 88/52 90/61  Pulse:   79   Resp: (!) 21  19   Temp:  98.2 F (36.8 C)    TempSrc:  Oral    SpO2:   99%   Weight:      Height:        General: appears to be stated age; alert, oriented Skin: warm, dry, no rash Head:  AT/[place] Mouth:  Oral mucosa membranes appear dry, normal dentition Neck: supple; trachea midline Heart:  RRR; did not appreciate any M/R/G Lungs: CTAB, did not appreciate any wheezes, rales, or  rhonchi Abdomen: + BS; soft, ND, NT Vascular: 2+ pedal pulses b/l; 2+ radial pulses b/l Extremities: Trace edema in bilateral lower extremities, no muscle wasting        Labs on Admission: I have personally reviewed following labs and imaging studies  CBC: Recent Labs  Lab 10/30/24 2246  WBC 6.4  HGB 8.3*  HCT 26.7*  MCV 102.7*  PLT 215   Basic Metabolic Panel: Recent Labs  Lab 10/30/24 2022  NA 128*  K 4.4  CL 95*  CO2 19*  GLUCOSE 117*  BUN 22  CREATININE 2.96*  CALCIUM  8.1*   GFR: Estimated Creatinine Clearance: 16.5 mL/min (A) (by C-G formula based on SCr of 2.96 mg/dL (H)). Liver Function Tests: Recent Labs  Lab 10/30/24 2022  AST 76*  ALT 70*  ALKPHOS 171*  BILITOT 2.2*  PROT 4.5*  ALBUMIN 2.1*   Recent Labs  Lab 10/30/24 2022  LIPASE 31   No results for input(s): AMMONIA in the last 168 hours. Coagulation Profile: Recent Labs  Lab 10/30/24 2003  INR 1.1   Cardiac Enzymes: No results for input(s): CKTOTAL, CKMB, CKMBINDEX, TROPONINI in the last 168 hours. BNP (last 3 results) No results for input(s): PROBNP in the last 8760 hours. HbA1C: No results for input(s): HGBA1C in the last 72 hours. CBG: No results for input(s): GLUCAP in the last 168 hours. Lipid Profile: No results for input(s): CHOL, HDL, LDLCALC, TRIG, CHOLHDL, LDLDIRECT in the last 72 hours. Thyroid Function Tests: No results for input(s): TSH, T4TOTAL, FREET4, T3FREE, THYROIDAB in the last 72 hours. Anemia Panel: No results for input(s): VITAMINB12, FOLATE, FERRITIN, TIBC, IRON, RETICCTPCT in the last 72 hours. Urine analysis:    Component Value Date/Time   COLORURINE AMBER (A) 10/30/2024 2235   APPEARANCEUR CLOUDY (A) 10/30/2024 2235   LABSPEC 1.011 10/30/2024 2235   PHURINE 5.0 10/30/2024 2235   GLUCOSEU NEGATIVE 10/30/2024 2235   HGBUR NEGATIVE 10/30/2024 2235   BILIRUBINUR NEGATIVE 10/30/2024 2235   BILIRUBINUR  negative 02/13/2019 1437   KETONESUR NEGATIVE 10/30/2024 2235   PROTEINUR NEGATIVE 10/30/2024 2235   UROBILINOGEN 0.2 02/13/2019 1437   UROBILINOGEN 0.2 09/08/2010 1135   NITRITE NEGATIVE 10/30/2024 2235   LEUKOCYTESUR LARGE (A) 10/30/2024 2235    Radiological Exams on Admission: CT CHEST WO CONTRAST Result Date: 10/30/2024 CLINICAL DATA:  Shortness of breath productive cough EXAM: CT CHEST WITHOUT CONTRAST TECHNIQUE: Multidetector CT imaging of the chest was performed following the standard protocol without IV contrast. RADIATION DOSE REDUCTION: This exam was performed according to the departmental dose-optimization program which includes automated exposure control, adjustment  of the mA and/or kV according to patient size and/or use of iterative reconstruction technique. COMPARISON:  Chest x-ray 10/30/2024, CT 05/29/2019 FINDINGS: Cardiovascular: Limited without intravenous contrast. Normal cardiac size. No pericardial effusion. Nonaneurysmal aorta Mediastinum/Nodes: Patent trachea. No thyroid mass. No suspicious lymph nodes. Esophagus within normal limits. Lungs/Pleura: Mild bronchiectasis within the bilateral lungs. Right greater than left subpleural reticulations and bandlike densities with areas of mild distortion, favored to represent post infectious or post inflammatory scarring. No acute confluent airspace disease, pleural effusion or pneumothorax. Scattered small solid pulmonary nodules measuring less than 5 mm, for example punctate 2 mm superior left lower lobe pulmonary nodule on series 3, image 62. 3 mm right upper lobe pulmonary nodule on series 3, image 61. Upper Abdomen: Elevation of the right diaphragm. Marked low-density liver consistent with steatosis. The liver is enlarged, measuring at least 19 cm craniocaudal with the inferior extent non included. The kidneys are atrophic. Complex lesions within the bilateral kidneys, on the right measuring 16 mm and on the left measuring 12 mm.  Musculoskeletal: No acute osseous abnormality. IMPRESSION: 1. Mild bronchiectasis within the bilateral lungs with right greater than left subpleural reticulations and bandlike densities and areas of mild distortion, favored to represent post infectious or post inflammatory scarring. No acute confluent airspace disease. 2. Scattered small solid pulmonary nodules measuring less than 5 mm. No follow-up needed if patient is low-risk (and has no known or suspected primary neoplasm). Non-contrast chest CT can be considered in 12 months if patient is high-risk. This recommendation follows the consensus statement: Guidelines for Management of Incidental Pulmonary Nodules Detected on CT Images: From the Fleischner Society 2017; Radiology 2017; 284:228-243. 3. Hepatomegaly with marked hepatic steatosis. 4. Atrophic kidneys with suspected complex lesions within the bilateral kidneys, incompletely characterized on this noncontrast study and also incompletely visualized. Consider initial evaluation with nonemergent renal ultrasound though renal CT or MRI might ultimately be required. Electronically Signed   By: Luke Bun M.D.   On: 10/30/2024 21:44   DG Chest Portable 1 View Result Date: 10/30/2024 EXAM: 1 VIEW(S) XRAY OF THE CHEST 10/30/2024 08:30:00 PM COMPARISON: Comparison with 02/01/2019. CLINICAL HISTORY: shob shob FINDINGS: LUNGS AND PLEURA: Streak opacities in the right mid and lower lung and left lower lung favor atelectasis or scarring. No pleural effusion. No pneumothorax. HEART AND MEDIASTINUM: Stable cardiomediastinal silhouette. BONES AND SOFT TISSUES: No acute osseous abnormality. IMPRESSION: 1. Streaky opacities in the right mid/lower and left lower lung, favoring atelectasis or scarring. Infiltrates not excluded. Electronically signed by: Norman Gatlin MD 10/30/2024 08:32 PM EST RP Workstation: [id number]      Assessment/Plan   Principal Problem:   Generalized weakness Active Problems:   Acute  renal failure superimposed on stage 4 chronic kidney disease (HCC)   Acute cystitis   Hyponatremia   Transaminitis   Macrocytic anemia   History of essential hypertension   History of alcohol abuse      #) Generalized weakness:  4 to 5-day duration of generalized weakness, in the absence of any evidence of acute focal neurologic deficits, including no evidence of acute focal weakness to suggest acute CVA.  Suspect contribution from physiologic stress stemming from presenting acute cystitis.  No e/o additional infectious process at this time, including CT chest which showed no evidence of infiltrate to suggest pneumonia.  In the setting of presenting mild acute transaminitis, will also check CT abdomen/pelvis.  There may be additional metabolic contributions in the setting of clinical evidence of dehydration as well  as acute kidney injury superimposed on CKD 4 as well as potential contribution from new diagnosis of macrocytic anemia as well as new diagnosis of hyponatremia.  Will further eval for any additional contributions from endocrine/metabolic sources, as detailed below.   Plan: work-up and management of presenting acute cystitis, as described below. PT/OT consults ordered for the AM. Fall precautions. CMP/CBC in the AM. Check TSH, serum Mg level. Check B12 level, CPK level.  Further evaluation and management of macrocytic anemia as well as new diagnosis of hyponatremia, as below.  IV fluids, as further detailed below.  CT abdomen/pelvis.                         #) Acute cystitis: In the context of 4 to 5 days of new onset generalized weakness, there is suspicion for urinary tract infection in the setting of patient's urinalysis that appears consistent with infection, noting cloudy appearance with significant pyuria, many bacteria, large leukocyte esterase, and no evidence of succumbs epithelial cells to suggest a contaminated specimen.  Of note, in the absence of  leukocytosis and in the absence of objective fever, SIRS criteria are not met for sepsis at this time.  Urine culture was collected in the ED followed by initiation of Rocephin , which will be continued for now.  Plan: Lactated Ringer 's at 125 cc/h x 10 hours.  Follow-up for result of urine culture.  Continue Rocephin .  Monitor strict I's and O's and daily weights.  Repeat CBC in the morning.  Monitor on subject.  Follow-up for result of CT abdomen/pelvis, as above.                       #) Acute Kidney Injury superimposed on CKD stage IV:   No context of a documented history of CKD 4 with baseline creatinine 1.8-2.5, with most recent prior creatinine data point noted to be 1.8 on 09/08/2023, presenting creatinine is found to be 2.96.  Potentially multifactorial in nature, with suspected contribution from dehydration as a result of patient's reported decline in oral intake over the last few days in the context of her progressive generalized weakness while living at home.  Kidneys were incompletely visualized on CT chest performed this evening, but were reported to show atrophic appearance with suspected complex lesions bilaterally.  Will further assess with dedicated CT abdomen/pelvis, including evaluation for any evidence of postrenal obstructive contribution.  Of note, today's urinalysis shows significant pyuria, will demonstrating no evidence of additional urinary casts, including no evidence of red blood cells.  Of note, the patient has received 1 L NS bolus in the ED this evening.  Plan: monitor strict I's & O's and daily weights. Attempt to avoid nephrotoxic agents. Refrain from NSAIDs. Repeat CMP in the morning. Check serum magnesium  level. Add-on random urine sodium and random urine creatinine.  CT abdomen/pelvis, without contrast, as further detailed above.  Lactated Ringer 's, as above.  Add on CPK level.                   #) Acute hypo-osmolar hyponatremia:  Presenting serum sodium level of 128, compared to most recent prior value 137 in September 2024. Suspect an element of hypovolumeia, with suspected contribution from dehydration in the setting of clinical evidence of such as well as report of recent decline in oral intake. Differential also includes the possibility of a contribution from SIADH, with today's CT chest showing mild bronchiectasis along with findings suggestive  of postinfectious versus postinflammatory scarring. in general, will provide gentle IV fluids to attend to suspected contribution from dehydration, while further evaluating for any additional contributing factors, including SIADH, as further detailed below. No overt pharmacologic contributions. Of note, no evidence of associated acute focal neurologic deficits and no report of recent trauma.  No overt evidence of acute volume overload, with CT chest showing no evidence of interstitial or pulmonary edema, which also showed no evidence of ascites in the visualized portion of the abdomen.  Will continue to trend BMP, as below.  In the context of family is concerned that the patient may have resumed consumption of alcohol, differential would also include beer drinkers [place] mania.  Plan: monitor strict I's and O's and daily weights.  check  random urine sodium, urine osmolality.  Check serum osmolality to confirm suspected hypoosmolar etiology.  Repeat CMP in the morning. Check TSH. Gentle IVF's, as above. Check serum uric acid level, as SIADH can be associated with hypouricemia due to hyperuricuria.  BMP in the morning.                     #) Transaminitis: Relative to most recent prior liver enzymes checked in September 2024, today's CMP shows interval increase in AST, ALT, as well as very mild elevation in total bilirubin and alkaline phosphatase, as further quantified above.  Given the 14 month gap in liver enzyme data points, the acute versus chronicity of these findings is  unclear.  Differential includes potential chronic transaminitis given that CT chest showed evidence of hepatomegaly with hepatic steatosis, in the context of patient's reported history of alcohol abuse, with family concern for potential resumption of alcohol consumption.  Clinically, acute alcoholic hepatitis appears less likely, given the limited elevation in her liver enzymes, as well as the absence of any fever in the absence of any associated abdominal discomfort.  It is noted, that even if the patient did to have acute alcoholic hepatitis, that she would not qualify for steroids at this time, as acute kidney injury would serve as a contraindication for such.  As the liver, gallbladder, and biliary ductal system was incompletely visualized on CT chest, we will pursue independent CT abdomen/pelvis to further evaluate.  It is noted that hepatic synthetic function appears to remain preserved, with INR of 1.1.  Plan: CT abdomen/pelvis without contrast, as above.  Repeat CMP in the morning.  PTT, INR in the morning.  Check GGT, direct bilirubin level.  Check acute viral hepatitis panel.  Check urinary drug screen.  As component of evaluation for acute alcoholic hepatitis, will also check LDH level.                     #) Macrocytic anemia: New diagnosis, with presenting hemoglobin of 8.3, relative to most recent prior hemoglobin value of 3.7 in September 2024.  This appears relative to patient's baseline hemoglobin in the range of 13-14.  The specific chronicity of this finding is unclear, given a nearly 27-month gap in hemoglobin data points.  No overt evidence of acute blood loss at this time.  Given the macrocytic nature of these findings, differential includes underlying liver disease, noting that CT chest showed hepatomegaly with hepatic steatosis, versus alcohol abuse given family's concern for for the patient's potential resumption of alcohol consumption.  There may also be a  contribution from interval decline in renal function.  Plan: Check iron studies, B12 level, folic acid level, reticulocyte count.  Type  and screen ordered.  Repeat CBC in the morning.  PTT, INR in the morning.  Add on technologist smear review.                        #) Essential Hypertension: documented h/o such, with outpatient antihypertensive regimen including metoprolol  succinate.  SBP's in the ED today: 90s to low 100s mmHg. in the context of presenting suspected infection in the form of acute cystitis, as well as soft presenting blood pressures, will hold home beta-blocker for now.  Plan: Close monitoring of subsequent BP via routine VS. hold home metoprolol  succinate for now.  Monitor on symmetry.  Monitor strict I's and O's and daily weights.  Further evaluation and management of presenting microcytic anemia, as above.                       # (History of alcohol abuse: In the context of a history of alcohol abuse, following which the patient has gone through a period of alcohol abstinence, family can the patient may have recently resumed alcohol consumption as further detailed above.  Presenting serum ethanol level less than 15.  Plan: I placed consult with transition to care team given the above history.  Check urinary drug screen.  Further evaluation management of presenting new diagnosis of transaminitis , as above.  Daily folic acid, multivitamin, and thiamine supplementation, first dose of thiamine supplementation to occur now.     DVT prophylaxis: SCD's   Code Status: Full code Family Communication: none Disposition Plan: Per Rounding Team Consults called: none;  Admission status: Observation     I SPENT GREATER THAN 75  MINUTES IN CLINICAL CARE TIME/MEDICAL DECISION-MAKING IN COMPLETING THIS ADMISSION.      Eva NOVAK Cloy Cozzens DO [facility]  From 7PM - 7AM   10/30/2024, 11:52 PM

## 2024-10-31 ENCOUNTER — Encounter (HOSPITAL_COMMUNITY): Payer: Self-pay | Admitting: Internal Medicine

## 2024-10-31 ENCOUNTER — Observation Stay (HOSPITAL_COMMUNITY)

## 2024-10-31 DIAGNOSIS — R7401 Elevation of levels of liver transaminase levels: Secondary | ICD-10-CM | POA: Diagnosis present

## 2024-10-31 DIAGNOSIS — D539 Nutritional anemia, unspecified: Secondary | ICD-10-CM | POA: Diagnosis present

## 2024-10-31 DIAGNOSIS — J45909 Unspecified asthma, uncomplicated: Secondary | ICD-10-CM | POA: Diagnosis present

## 2024-10-31 DIAGNOSIS — Z885 Allergy status to narcotic agent status: Secondary | ICD-10-CM | POA: Diagnosis not present

## 2024-10-31 DIAGNOSIS — E871 Hypo-osmolality and hyponatremia: Secondary | ICD-10-CM | POA: Diagnosis present

## 2024-10-31 DIAGNOSIS — D631 Anemia in chronic kidney disease: Secondary | ICD-10-CM | POA: Diagnosis present

## 2024-10-31 DIAGNOSIS — Z83438 Family history of other disorder of lipoprotein metabolism and other lipidemia: Secondary | ICD-10-CM | POA: Diagnosis not present

## 2024-10-31 DIAGNOSIS — Z820 Family history of epilepsy and other diseases of the nervous system: Secondary | ICD-10-CM | POA: Diagnosis not present

## 2024-10-31 DIAGNOSIS — K219 Gastro-esophageal reflux disease without esophagitis: Secondary | ICD-10-CM | POA: Diagnosis present

## 2024-10-31 DIAGNOSIS — Z79899 Other long term (current) drug therapy: Secondary | ICD-10-CM | POA: Diagnosis not present

## 2024-10-31 DIAGNOSIS — Z8379 Family history of other diseases of the digestive system: Secondary | ICD-10-CM | POA: Diagnosis not present

## 2024-10-31 DIAGNOSIS — Z87891 Personal history of nicotine dependence: Secondary | ICD-10-CM | POA: Diagnosis not present

## 2024-10-31 DIAGNOSIS — R531 Weakness: Secondary | ICD-10-CM | POA: Diagnosis present

## 2024-10-31 DIAGNOSIS — E663 Overweight: Secondary | ICD-10-CM | POA: Diagnosis present

## 2024-10-31 DIAGNOSIS — N2889 Other specified disorders of kidney and ureter: Secondary | ICD-10-CM | POA: Diagnosis present

## 2024-10-31 DIAGNOSIS — R2241 Localized swelling, mass and lump, right lower limb: Secondary | ICD-10-CM | POA: Diagnosis not present

## 2024-10-31 DIAGNOSIS — N179 Acute kidney failure, unspecified: Secondary | ICD-10-CM | POA: Diagnosis present

## 2024-10-31 DIAGNOSIS — N3000 Acute cystitis without hematuria: Secondary | ICD-10-CM | POA: Diagnosis not present

## 2024-10-31 DIAGNOSIS — J470 Bronchiectasis with acute lower respiratory infection: Secondary | ICD-10-CM | POA: Diagnosis present

## 2024-10-31 DIAGNOSIS — F1011 Alcohol abuse, in remission: Secondary | ICD-10-CM | POA: Diagnosis present

## 2024-10-31 DIAGNOSIS — J180 Bronchopneumonia, unspecified organism: Secondary | ICD-10-CM | POA: Diagnosis present

## 2024-10-31 DIAGNOSIS — Z8679 Personal history of other diseases of the circulatory system: Secondary | ICD-10-CM

## 2024-10-31 DIAGNOSIS — I129 Hypertensive chronic kidney disease with stage 1 through stage 4 chronic kidney disease, or unspecified chronic kidney disease: Secondary | ICD-10-CM | POA: Diagnosis present

## 2024-10-31 DIAGNOSIS — N184 Chronic kidney disease, stage 4 (severe): Secondary | ICD-10-CM | POA: Diagnosis present

## 2024-10-31 DIAGNOSIS — F1010 Alcohol abuse, uncomplicated: Secondary | ICD-10-CM | POA: Diagnosis present

## 2024-10-31 DIAGNOSIS — E038 Other specified hypothyroidism: Secondary | ICD-10-CM | POA: Diagnosis present

## 2024-10-31 DIAGNOSIS — Z96641 Presence of right artificial hip joint: Secondary | ICD-10-CM | POA: Diagnosis present

## 2024-10-31 DIAGNOSIS — R8271 Bacteriuria: Secondary | ICD-10-CM | POA: Diagnosis present

## 2024-10-31 DIAGNOSIS — R0609 Other forms of dyspnea: Secondary | ICD-10-CM | POA: Diagnosis not present

## 2024-10-31 DIAGNOSIS — I959 Hypotension, unspecified: Secondary | ICD-10-CM | POA: Diagnosis present

## 2024-10-31 LAB — MAGNESIUM
Magnesium: 1.9 mg/dL (ref 1.7–2.4)
Magnesium: 1.7 mg/dL (ref 1.7–2.4)

## 2024-10-31 LAB — COMPREHENSIVE METABOLIC PANEL WITH GFR
ALT: 60 U/L — ABNORMAL HIGH (ref 0–44)
AST: 61 U/L — ABNORMAL HIGH (ref 15–41)
Albumin: 1.9 g/dL — ABNORMAL LOW (ref 3.5–5.0)
Alkaline Phosphatase: 140 U/L — ABNORMAL HIGH (ref 38–126)
Anion gap: 14 (ref 5–15)
BUN: 21 mg/dL (ref 8–23)
CO2: 16 mmol/L — ABNORMAL LOW (ref 22–32)
Calcium: 8 mg/dL — ABNORMAL LOW (ref 8.9–10.3)
Chloride: 97 mmol/L — ABNORMAL LOW (ref 98–111)
Creatinine, Ser: 2.83 mg/dL — ABNORMAL HIGH (ref 0.44–1.00)
GFR, Estimated: 18 mL/min — ABNORMAL LOW (ref >60)
Glucose, Bld: 95 mg/dL (ref 70–99)
Potassium: 4.2 mmol/L (ref 3.5–5.1)
Sodium: 127 mmol/L — ABNORMAL LOW (ref 135–145)
Total Bilirubin: 1.5 mg/dL — ABNORMAL HIGH (ref 0.0–1.2)
Total Protein: 3.8 g/dL — ABNORMAL LOW (ref 6.5–8.1)

## 2024-10-31 LAB — CBC WITH DIFFERENTIAL/PLATELET
Abs Immature Granulocytes: 0.09 K/uL — ABNORMAL HIGH (ref 0.00–0.07)
Basophils Absolute: 0 K/uL (ref 0.0–0.1)
Basophils Relative: 1 %
Eosinophils Absolute: 0.1 K/uL (ref 0.0–0.5)
Eosinophils Relative: 2 %
HCT: 24.5 % — ABNORMAL LOW (ref 36.0–46.0)
Hemoglobin: 7.6 g/dL — ABNORMAL LOW (ref 12.0–15.0)
Immature Granulocytes: 2 %
Lymphocytes Relative: 15 %
Lymphs Abs: 0.7 K/uL (ref 0.7–4.0)
MCH: 32.3 pg (ref 26.0–34.0)
MCHC: 31 g/dL (ref 30.0–36.0)
MCV: 104.3 fL — ABNORMAL HIGH (ref 80.0–100.0)
Monocytes Absolute: 0.7 K/uL (ref 0.1–1.0)
Monocytes Relative: 13 %
Neutro Abs: 3.3 K/uL (ref 1.7–7.7)
Neutrophils Relative %: 67 %
Platelets: 204 K/uL (ref 150–400)
RBC: 2.35 MIL/uL — ABNORMAL LOW (ref 3.87–5.11)
RDW: 15.2 % (ref 11.5–15.5)
WBC: 4.9 K/uL (ref 4.0–10.5)
nRBC: 0.6 % — ABNORMAL HIGH (ref 0.0–0.2)

## 2024-10-31 LAB — RETICULOCYTES
Immature Retic Fract: 40.7 % — ABNORMAL HIGH (ref 2.3–15.9)
RBC.: 2.38 MIL/uL — ABNORMAL LOW (ref 3.87–5.11)
Retic Count, Absolute: 43.6 K/uL (ref 19.0–186.0)
Retic Ct Pct: 1.8 % (ref 0.4–3.1)

## 2024-10-31 LAB — HEPATITIS PANEL, ACUTE
HCV Ab: NONREACTIVE
Hep A IgM: NONREACTIVE
Hep B C IgM: NONREACTIVE
Hepatitis B Surface Ag: NONREACTIVE

## 2024-10-31 LAB — PROTIME-INR
INR: 1 (ref 0.8–1.2)
Prothrombin Time: 14 s (ref 11.4–15.2)

## 2024-10-31 LAB — IRON AND TIBC
Iron: 100 ug/dL (ref 28–170)
Saturation Ratios: 94 % — ABNORMAL HIGH (ref 10.4–31.8)
TIBC: 106 ug/dL — ABNORMAL LOW (ref 250–450)
UIBC: 6 ug/dL

## 2024-10-31 LAB — FOLATE: Folate: 8.5 ng/mL (ref >5.9)

## 2024-10-31 LAB — FERRITIN: Ferritin: 1374 ng/mL — ABNORMAL HIGH (ref 11–307)

## 2024-10-31 LAB — URIC ACID: Uric Acid, Serum: 13.6 mg/dL — ABNORMAL HIGH (ref 2.5–7.1)

## 2024-10-31 LAB — OSMOLALITY: Osmolality: 276 mosm/kg (ref 275–295)

## 2024-10-31 LAB — CORTISOL: Cortisol, Plasma: 19.1 ug/dL

## 2024-10-31 LAB — TSH: TSH: 12.223 u[IU]/mL — ABNORMAL HIGH (ref 0.350–4.500)

## 2024-10-31 LAB — VITAMIN B12: Vitamin B-12: 1045 pg/mL — ABNORMAL HIGH (ref 180–914)

## 2024-10-31 LAB — BRAIN NATRIURETIC PEPTIDE: B Natriuretic Peptide: 149.8 pg/mL — ABNORMAL HIGH (ref 0.0–100.0)

## 2024-10-31 LAB — GAMMA GT: GGT: 334 U/L — ABNORMAL HIGH (ref 7–50)

## 2024-10-31 LAB — LACTATE DEHYDROGENASE: LDH: 522 U/L — ABNORMAL HIGH (ref 105–235)

## 2024-10-31 LAB — APTT: aPTT: 24 s (ref 24–36)

## 2024-10-31 LAB — TECHNOLOGIST SMEAR REVIEW

## 2024-10-31 LAB — BILIRUBIN, DIRECT: Bilirubin, Direct: 0.7 mg/dL — ABNORMAL HIGH (ref 0.0–0.2)

## 2024-10-31 LAB — CK: Total CK: 146 U/L (ref 38–234)

## 2024-10-31 LAB — T4, FREE: Free T4: 0.8 ng/dL (ref 0.61–1.12)

## 2024-10-31 MED ORDER — CEFTRIAXONE SODIUM 1 G IJ SOLR
1.0000 g | INTRAMUSCULAR | Status: AC
  Administered 2024-10-31 – 2024-11-03 (×4): 1 g via INTRAVENOUS
  Filled 2024-10-31 (×4): qty 10

## 2024-10-31 MED ORDER — MIDODRINE HCL 5 MG PO TABS
2.5000 mg | ORAL_TABLET | Freq: Three times a day (TID) | ORAL | Status: DC
  Administered 2024-10-31 – 2024-11-04 (×11): 2.5 mg via ORAL
  Filled 2024-10-31 (×13): qty 1

## 2024-10-31 MED ORDER — INFLUENZA VAC SPLIT HIGH-DOSE 0.5 ML IM SUSY
0.5000 mL | PREFILLED_SYRINGE | INTRAMUSCULAR | Status: DC
  Filled 2024-10-31 (×2): qty 0.5

## 2024-10-31 MED ORDER — MAGNESIUM SULFATE 2 GM/50ML IV SOLN
2.0000 g | Freq: Once | INTRAVENOUS | Status: AC
  Administered 2024-10-31: 2 g via INTRAVENOUS
  Filled 2024-10-31: qty 50

## 2024-10-31 MED ORDER — THIAMINE HCL 100 MG/ML IJ SOLN
100.0000 mg | Freq: Every day | INTRAMUSCULAR | Status: DC
  Administered 2024-10-31: 100 mg via INTRAVENOUS
  Filled 2024-10-31: qty 2

## 2024-10-31 MED ORDER — PROSIGHT PO TABS
1.0000 | ORAL_TABLET | Freq: Every day | ORAL | Status: DC
  Filled 2024-10-31 (×6): qty 1

## 2024-10-31 MED ORDER — BENZONATATE 100 MG PO CAPS
100.0000 mg | ORAL_CAPSULE | Freq: Two times a day (BID) | ORAL | Status: DC | PRN
  Administered 2024-11-01 – 2024-11-06 (×7): 100 mg via ORAL
  Filled 2024-10-31 (×7): qty 1

## 2024-10-31 MED ORDER — FOLIC ACID 1 MG PO TABS
1.0000 mg | ORAL_TABLET | Freq: Every day | ORAL | Status: DC
  Administered 2024-10-31 – 2024-11-06 (×7): 1 mg via ORAL
  Filled 2024-10-31 (×7): qty 1

## 2024-10-31 MED ORDER — THIAMINE MONONITRATE 100 MG PO TABS
100.0000 mg | ORAL_TABLET | Freq: Every day | ORAL | Status: DC
  Administered 2024-11-01 – 2024-11-06 (×6): 100 mg via ORAL
  Filled 2024-10-31 (×6): qty 1

## 2024-10-31 MED ORDER — AZITHROMYCIN 500 MG PO TABS
500.0000 mg | ORAL_TABLET | Freq: Every day | ORAL | Status: AC
Start: 2024-10-31 — End: 2024-11-02
  Administered 2024-10-31 – 2024-11-02 (×3): 500 mg via ORAL
  Filled 2024-10-31 (×2): qty 1
  Filled 2024-10-31: qty 2

## 2024-10-31 NOTE — ED Notes (Signed)
 Pt sitting on edge of bed, PT at bedside, pt has no requests before transport to the floor. Pt denies pain

## 2024-10-31 NOTE — ED Notes (Signed)
 Assuming pt care, pt bib ems coming from home for sob x 3 wks, and intermittent diarrhea x 6 days, pt aaox4, mae, skin warm/dry, pt reports med hx. HTN, IBS, GERD, asthma. pt denies pain/discomfort, call bell within range

## 2024-10-31 NOTE — ED Notes (Addendum)
 Pt in bed, family at bedside, pt has no complaints at this time.  Pt awake and oriented, pt states that she feels better than yesterday, states that she is still a little bit weak.

## 2024-10-31 NOTE — TOC Initial Note (Signed)
 Transition of Care [facility]) - Initial/Assessment Note    Patient Details  Name: Ana Ryan MRN: [id number] Date of Birth: 12-Dec-1956  Transition [facility]) CM/SW Contact:    Bernardino Dean, LCSWA Phone Number: 10/31/2024, 4:08 PM  Clinical Narrative:                 CSW consult for SUD (etoh) received. Met with patient at bedside to offer support, discuss SUD, and explore disposition. Patient agreeable. Shares that she is feeling better than when she first presented to the ED and expects to be able to discharge home when cleared. States she lives alone but has several family members who are local and available for support. Inpatient Case Management to follow for [facility], PT/OT recs HH.   She does endorse alcohol use. Drinks 3-4 drinks/day and sips on them throughout the day. She endorses a period of much heavier alcohol use earlier in life but was eventually able to achieve sobriety following a 30-day stay at a residential treatment facility, [facility]. Was sober for years but eventually relapsed 1-2 years ago. Again achieved sobriety for 6 months, but relapsed again roughly 6 months ago. We explored her motivations for change which include her health, family (specifically grandchildren and siblings), and the fact that she is tired of feeling intoxicated. She states prominent triggers are familial stress and idleness. She shares coping strategies include staying busy and driving. She is excited about the holidays approaching and hopes to use it as motivation to return home and limit alcohol intake. Hopes to stay busy with supportive family members and holiday preparations. States her readiness to change is a 10 out of 10 but realistically she knows it is easier said than done. Encouragement and support were provided. Resource history and availability were explored but patient ultimately declined. She is aware of this SW's availability and was encouraged to reach out to staff should she wish to  talk again.   Expected Discharge Plan: Home w Home Health Services Barriers to Discharge: Continued Medical Work up   Patient Goals and CMS Choice Patient states their goals for this hospitalization and ongoing recovery are:: Recover from illness and return home, feel better for the [facility]          Expected Discharge Plan and Services In-house Referral: Clinical Social Work Discharge Planning Services: CM Consult                                          Prior Living Arrangements/Services   Lives with:: Self Patient language and need for interpreter reviewed:: Yes Do you feel safe going back to the place where you live?: Yes      Need for Family Participation in Patient Care: No (Comment) Care giver support system in place?: Yes (comment)   Criminal Activity/Legal Involvement Pertinent to Current Situation/Hospitalization: No - Comment as needed  Activities of Daily Living   ADL Screening (condition at time of admission) Independently performs ADLs?: Yes (appropriate for developmental age) Is the patient deaf or have difficulty hearing?: No Does the patient have difficulty seeing, even when wearing glasses/contacts?: No Does the patient have difficulty concentrating, remembering, or making decisions?: No  Permission Sought/Granted                  Emotional Assessment Appearance:: Appears stated age Attitude/Demeanor/Rapport: Engaged Affect (typically observed): Appropriate Orientation: : Oriented to  Self, Oriented to Place, Oriented to  Time, Oriented to Situation Alcohol / Substance Use: Alcohol Use Psych Involvement: No (comment)  Admission diagnosis:  General weakness [R53.1] Bronchiectasis without complication (HCC) [J47.9] Acute cystitis without hematuria [N30.00] Generalized weakness [R53.1] Acute kidney injury superimposed on CKD [N17.9, N18.9] Patient Active Problem List   Diagnosis Date Noted   Acute cystitis 10/31/2024   Hyponatremia  10/31/2024   Transaminitis 10/31/2024   Macrocytic anemia 10/31/2024   History of essential hypertension 10/31/2024   History of alcohol abuse 10/31/2024   General weakness 10/31/2024   Generalized weakness 10/30/2024   Interstitial nephritis 03/12/2019   Metabolic acidosis 02/01/2019   Acute renal failure superimposed on stage 4 chronic kidney disease (HCC) 02/01/2019   Hyperglycemia 02/01/2019   Alcohol abuse 02/01/2019   Incarcerated incisional hernia 04/13/2016   Narrowing of intervertebral disc space 07/06/2015   Spondylosis 07/06/2015   Ex-cigarette smoker 07/06/2015   BP (high blood pressure) 07/06/2015   Adaptive colitis 07/06/2015   Mild major depression 07/06/2015   Asymptomatic postmenopausal status 07/06/2015   Overweight 07/06/2015   Cannot sleep 06/30/2010   Anxiety 02/09/2010   Esophagitis, reflux 03/10/2008   Hypercholesteremia 03/10/2008   PCP:  Bertrum Charlie CROME, MD Pharmacy:   [facility] [place], [place] - [address] [place] [address] Phone: [phone] Fax: [phone]     Social Drivers of Health (SDOH) Social History: SDOH Screenings   Food Insecurity: No Food Insecurity (10/31/2024)  Housing: Low Risk  (10/31/2024)  Transportation Needs: No Transportation Needs (10/31/2024)  Utilities: Not At Risk (10/31/2024)  Alcohol Screen: Low Risk  (05/26/2022)  Depression (PHQ2-9): Low Risk  (05/26/2022)  Financial Resource Strain: Low Risk  (09/08/2023)   Received from [facility]  Physical Activity: Insufficiently Active (09/08/2023)   Received from [facility]  Social Connections: Moderately Isolated (10/31/2024)  Tobacco Use: Medium Risk (10/31/2024)  Health Literacy: Adequate Health Literacy (09/08/2023)   Received from [facility]   SDOH Interventions:     Readmission Risk Interventions     No data to display

## 2024-10-31 NOTE — Progress Notes (Addendum)
 PROGRESS NOTE    Ana Ryan  FMW:[id number] DOB: [birth year] DOA: 10/30/2024 PCP: Bertrum Charlie CROME, MD    Brief Narrative:  Ana Ryan is a 68 y.o. female with medical history significant for essential hypertension, CKD stage IV with baseline creatinine 1.8-2.5, who is admitted to [facility] on 10/30/2024 with generalized weakness after presenting from home to [facility] ED complaining of generalized weakness.    Assessment and Plan: Generalized weakness due to UTI vs PNA:   -4 to 5-day duration of generalized weakness, in the absence of any evidence of acute focal neurologic deficits, including no evidence of acute focal weakness to suggest acute CVA.   -follow cultures -IV Abx -PT/OT eval   hypotension   -recheck manually -check cortisol -IVF -treat infection  TSH is 12, check free T4  Multiple indeterminate renal masses: right superior pole exophytic 20 mm, right lower pole 2.3 x 1.4 cm, and left superior pole exophytic 12 mm in an atrophic kidney. Recommend prompt renal protocol MRI or CT without and with contrast for characterization. If any lesion is confirmed as a simple cyst (Bosniak I/II), no further follow-up is needed; otherwise manage per Bosniak/renal mass guidelines      Acute Kidney Injury superimposed on CKD stage IV:   -IVF -trend      Acute hypo-osmolar hyponatremia:  -trend      Transaminitis: Relative to most recent prior liver enzymes checked in September 2024, today's CMP shows interval increase in AST, ALT, as well as very mild elevation in total bilirubin and alkaline phosphatase, as further quantified above.   -   GGT elevated, direct bilirubin level.   -acute viral hepatitis panel negat -Check urinary drug screen.  -LDH elevated       Macrocytic anemia: New diagnosis,  -patient's baseline hemoglobin in the range of 13-14.  The specific chronicity of this finding is unclear, given a nearly 21-month gap in hemoglobin data points.   No overt evidence of acute blood loss at this time.   -  Add on technologist smear review.  -transfuse for <7        History of alcohol abuse: In the context of a history of alcohol abuse, following which the patient has gone through a period of alcohol abstinence, family can the patient may have recently resumed alcohol consumption as further detailed above.  Presenting serum ethanol level less than 15.     DVT prophylaxis: SCDs Start: 10/30/24 2357    Code Status: Full Code  Declined me calling daughter, says she will relay  Disposition Plan:  Level of care: Progressive Status is: Inpatient     Consultants:    Subjective: Overall feeling better, does not think BP cuff is working properly-- keeps blowing up  Objective: Vitals:   10/31/24 0719 10/31/24 0730 10/31/24 0800 10/31/24 0830  BP: (!) 89/62 (!) 99/57 116/63 (!) 89/60  Pulse: 78 80 87 79  Resp: 18 19 18 20   Temp: 98.8 F (37.1 C)     TempSrc: Oral     SpO2: 100% 97% 95% 93%  Weight:      Height:        Intake/Output Summary (Last 24 hours) at 10/31/2024 1051 Last data filed at 10/31/2024 0240 Gross per 24 hour  Intake 1340 ml  Output --  Net 1340 ml   Filed Weights   10/30/24 1907  Weight: 70.7 kg    Examination:   General: Appearance:     Overweight female in no  acute distress     Lungs:     respirations unlabored  Heart:    Normal heart rate. .    MS:   All extremities are intact.    Neurologic:   Awake, alert       Data Reviewed: I have personally reviewed following labs and imaging studies  CBC: Recent Labs  Lab 10/30/24 2246 10/31/24 0517  WBC 6.4 4.9  NEUTROABS  --  3.3  HGB 8.3* 7.6*  HCT 26.7* 24.5*  MCV 102.7* 104.3*  PLT 215 204   Basic Metabolic Panel: Recent Labs  Lab 10/30/24 2022 10/30/24 2246 10/31/24 0517  NA 128*  --  127*  K 4.4  --  4.2  CL 95*  --  97*  CO2 19*  --  16*  GLUCOSE 117*  --  95  BUN 22  --  21  CREATININE 2.96*  --  2.83*   CALCIUM  8.1*  --  8.0*  MG  --  1.9 1.7   GFR: Estimated Creatinine Clearance: 17.3 mL/min (A) (by C-G formula based on SCr of 2.83 mg/dL (H)). Liver Function Tests: Recent Labs  Lab 10/30/24 2022 10/31/24 0517  AST 76* 61*  ALT 70* 60*  ALKPHOS 171* 140*  BILITOT 2.2* 1.5*  PROT 4.5* 3.8*  ALBUMIN 2.1* 1.9*   Recent Labs  Lab 10/30/24 2022  LIPASE 31   No results for input(s): AMMONIA in the last 168 hours. Coagulation Profile: Recent Labs  Lab 10/30/24 2003 10/31/24 0517  INR 1.1 1.0   Cardiac Enzymes: Recent Labs  Lab 10/31/24 0517  CKTOTAL 146   BNP (last 3 results) No results for input(s): PROBNP in the last 8760 hours. HbA1C: No results for input(s): HGBA1C in the last 72 hours. CBG: No results for input(s): GLUCAP in the last 168 hours. Lipid Profile: No results for input(s): CHOL, HDL, LDLCALC, TRIG, CHOLHDL, LDLDIRECT in the last 72 hours. Thyroid Function Tests: Recent Labs    10/31/24 0517  TSH 12.223*   Anemia Panel: Recent Labs    10/31/24 0517  VITAMINB12 1,045*  FOLATE 8.5  FERRITIN 1,374*  TIBC 106*  IRON 100  RETICCTPCT 1.8   Sepsis Labs: No results for input(s): PROCALCITON, LATICACIDVEN in the last 168 hours.  No results found for this or any previous visit (from the past 240 hours).       Radiology Studies: CT ABDOMEN PELVIS WO CONTRAST Result Date: 10/31/2024 EXAM: CT ABDOMEN AND PELVIS WITHOUT CONTRAST 10/31/2024 04:00:12 AM TECHNIQUE: CT of the abdomen and pelvis was performed without the administration of intravenous contrast. Multiplanar reformatted images are provided for review. Automated exposure control, iterative reconstruction, and/or weight-based adjustment of the mA/kV was utilized to reduce the radiation dose to as low as reasonably achievable. COMPARISON: CT of the abdomen and pelvis dated 08/25/2019. CLINICAL HISTORY: Kidney failure, acute. FINDINGS: LOWER CHEST: There are new  streaky opacities present within the periphery of the lower lobes bilaterally, more pronounced on the right. There is also mild bronchiectasis present, also worse in the right lung base. LIVER: There is advanced fatty infiltration of the liver which measures approximately 25 cm in length in the midclavicular line. GALLBLADDER AND BILE DUCTS: Gallbladder is unremarkable. No biliary ductal dilatation. SPLEEN: No acute abnormality. PANCREAS: No acute abnormality. ADRENAL GLANDS: No acute abnormality. KIDNEYS, URETERS AND BLADDER: There is a round exophytic nodule arising posteriorly from the superior pole of the right kidney, measuring approximately 20 mm in diameter. There also appears to be  a nodule arising from the lower pole of the right kidney, image 46 of series 3, measuring approximately 2.3 x 1.4 cm. The left kidney is atrophic. There is a 12 mm exophytic nodule arising posterolaterally from the superior pole of the left kidney. Correlation with a renal sonogram is suggested. No stones in the kidneys or ureters. No hydronephrosis. No perinephric or periureteral stranding. Urinary bladder is unremarkable. GI AND BOWEL: Stomach demonstrates no acute abnormality. There is no bowel obstruction. The patient also appears to be status post appendectomy. PERITONEUM AND RETROPERITONEUM: No ascites. No free air. VASCULATURE: Aorta is normal in caliber. LYMPH NODES: No lymphadenopathy. REPRODUCTIVE ORGANS: The patient is status post hysterectomy and bilateral salpingo-oophorectomy. BONES AND SOFT TISSUES: The patient is status post bilateral total hip arthroplasties. No acute osseous abnormality. No focal soft tissue abnormality. IMPRESSION: 1. New streaky opacities in the periphery of the lower lobes bilaterally, more pronounced on the right, and mild bronchiectasis, worse in the right lung base 2. Advanced hepatic steatosis 3. Multiple indeterminate renal masses: right superior pole exophytic 20 mm, right lower pole 2.3  x 1.4 cm, and left superior pole exophytic 12 mm in an atrophic kidney. Recommend prompt renal protocol MRI or CT without and with contrast for characterization. If any lesion is confirmed as a simple cyst (Bosniak I/II), no further follow-up is needed; otherwise manage per Bosniak/renal mass guidelines Electronically signed by: Evalene Coho MD 10/31/2024 04:15 AM EST RP Workstation: [id number]   CT CHEST WO CONTRAST Result Date: 10/30/2024 CLINICAL DATA:  Shortness of breath productive cough EXAM: CT CHEST WITHOUT CONTRAST TECHNIQUE: Multidetector CT imaging of the chest was performed following the standard protocol without IV contrast. RADIATION DOSE REDUCTION: This exam was performed according to the departmental dose-optimization program which includes automated exposure control, adjustment of the mA and/or kV according to patient size and/or use of iterative reconstruction technique. COMPARISON:  Chest x-ray 10/30/2024, CT 05/29/2019 FINDINGS: Cardiovascular: Limited without intravenous contrast. Normal cardiac size. No pericardial effusion. Nonaneurysmal aorta Mediastinum/Nodes: Patent trachea. No thyroid mass. No suspicious lymph nodes. Esophagus within normal limits. Lungs/Pleura: Mild bronchiectasis within the bilateral lungs. Right greater than left subpleural reticulations and bandlike densities with areas of mild distortion, favored to represent post infectious or post inflammatory scarring. No acute confluent airspace disease, pleural effusion or pneumothorax. Scattered small solid pulmonary nodules measuring less than 5 mm, for example punctate 2 mm superior left lower lobe pulmonary nodule on series 3, image 62. 3 mm right upper lobe pulmonary nodule on series 3, image 61. Upper Abdomen: Elevation of the right diaphragm. Marked low-density liver consistent with steatosis. The liver is enlarged, measuring at least 19 cm craniocaudal with the inferior extent non included. The kidneys are  atrophic. Complex lesions within the bilateral kidneys, on the right measuring 16 mm and on the left measuring 12 mm. Musculoskeletal: No acute osseous abnormality. IMPRESSION: 1. Mild bronchiectasis within the bilateral lungs with right greater than left subpleural reticulations and bandlike densities and areas of mild distortion, favored to represent post infectious or post inflammatory scarring. No acute confluent airspace disease. 2. Scattered small solid pulmonary nodules measuring less than 5 mm. No follow-up needed if patient is low-risk (and has no known or suspected primary neoplasm). Non-contrast chest CT can be considered in 12 months if patient is high-risk. This recommendation follows the consensus statement: Guidelines for Management of Incidental Pulmonary Nodules Detected on CT Images: From the Fleischner Society 2017; Radiology 2017; 284:228-243. 3. Hepatomegaly with marked hepatic steatosis.  4. Atrophic kidneys with suspected complex lesions within the bilateral kidneys, incompletely characterized on this noncontrast study and also incompletely visualized. Consider initial evaluation with nonemergent renal ultrasound though renal CT or MRI might ultimately be required. Electronically Signed   By: Luke Bun M.D.   On: 10/30/2024 21:44   DG Chest Portable 1 View Result Date: 10/30/2024 EXAM: 1 VIEW(S) XRAY OF THE CHEST 10/30/2024 08:30:00 PM COMPARISON: Comparison with 02/01/2019. CLINICAL HISTORY: shob shob FINDINGS: LUNGS AND PLEURA: Streak opacities in the right mid and lower lung and left lower lung favor atelectasis or scarring. No pleural effusion. No pneumothorax. HEART AND MEDIASTINUM: Stable cardiomediastinal silhouette. BONES AND SOFT TISSUES: No acute osseous abnormality. IMPRESSION: 1. Streaky opacities in the right mid/lower and left lower lung, favoring atelectasis or scarring. Infiltrates not excluded. Electronically signed by: Norman Gatlin MD 10/30/2024 08:32 PM EST RP  Workstation: [id number]        Scheduled Meds:  azithromycin   500 mg Oral Daily   folic acid  1 mg Oral Daily   midodrine  2.5 mg Oral TID WC   multivitamin  1 tablet Oral Daily   [START ON 11/01/2024] thiamine  100 mg Oral Daily   Continuous Infusions:  cefTRIAXone  (ROCEPHIN )  IV     lactated ringers  125 mL/hr at 10/31/24 0142     LOS: 0 days    Time spent: 45 minutes spent on chart review, discussion with nursing staff, consultants, updating family and interview/physical exam; more than 50% of that time was spent in counseling and/or coordination of care.    Harlene RAYMOND Bowl, DO [facility] Available via Epic secure chat 7am-7pm After these hours, please refer to coverage provider listed on [email or web address] 10/31/2024, 10:51 AM

## 2024-10-31 NOTE — Evaluation (Signed)
 Physical Therapy Evaluation Patient Details Name: Ana Ryan MRN: [id number] DOB: 05/03/1956 Today's Date: 10/31/2024  History of Present Illness  68 y/o F presenting to ED on 11/12 with SOB x3 weeks, cough, diarrhea, admitted for generalized weakness.    PMH includes alcohol use, HTN, ARF, GERD, CKD IV  Clinical Impression  Pt admitted with above diagnosis. Pt was able to ambulate a few feet and then c/o dizziness.  BP slightly orthostatic therefore didn't push pt to walk further. Should progress well once medically appropriate.  Will follow acutely.  Pt currently with functional limitations due to the deficits listed below (see PT Problem List). Pt will benefit from acute skilled PT to increase their independence and safety with mobility to allow discharge.           If plan is discharge home, recommend the following: A little help with walking and/or transfers;A little help with bathing/dressing/bathroom;Assistance with cooking/housework;Assist for transportation;Help with stairs or ramp for entrance   Can travel by private vehicle        Equipment Recommendations None recommended by PT  Recommendations for Other Services       Functional Status Assessment Patient has had a recent decline in their functional status and demonstrates the ability to make significant improvements in function in a reasonable and predictable amount of time.     Precautions / Restrictions Precautions Precautions: Fall Restrictions Weight Bearing Restrictions Per Provider Order: No      Mobility  Bed Mobility Overal bed mobility: Needs Assistance Bed Mobility: Sidelying to Sit, Sit to Sidelying   Sidelying to sit: Contact guard assist     Sit to sidelying: Contact guard assist      Transfers Overall transfer level: Needs assistance Equipment used: 1 person hand held assist Transfers: Sit to/from Stand Sit to Stand: Contact guard assist           General transfer comment: No LOB  however did c/o dizziness and pt self limiting    Ambulation/Gait Ambulation/Gait assistance: Contact guard assist Gait Distance (Feet): 4 Feet Assistive device: 1 person hand held assist Gait Pattern/deviations: Step-through pattern, Decreased stride length   Gait velocity interpretation: <1.31 ft/sec, indicative of household ambulator   General Gait Details: C/O dizziness after taking a few steps and asking to lie back down.  Stairs            Wheelchair Mobility     Tilt Bed    Modified Rankin (Stroke Patients Only)       Balance Overall balance assessment: Mild deficits observed, not formally tested                                           Pertinent Vitals/Pain Pain Assessment Pain Assessment: No/denies pain    Home Living Family/patient expects to be discharged to:: Private residence Living Arrangements: Alone Available Help at Discharge: Family;Available PRN/intermittently Type of Home: House Home Access: Stairs to enter Entrance Stairs-Rails: Can reach both (in back) Entrance Stairs-Number of Steps: 3 back 5 front Alternate Level Stairs-Number of Steps: flight, but reports mainly staying on levels 1 & 2 Home Layout: Multi-level Home Equipment: Shower seat - built [employer] (2 wheels)      Prior Function Prior Level of Function : Independent/Modified Independent;Driving             Mobility Comments: ind ADLs Comments: ind  Extremity/Trunk Assessment   Upper Extremity Assessment Upper Extremity Assessment: Defer to OT evaluation    Lower Extremity Assessment Lower Extremity Assessment: Generalized weakness    Cervical / Trunk Assessment Cervical / Trunk Assessment: Normal  Communication   Communication Communication: No apparent difficulties    Cognition Arousal: Alert Behavior During Therapy: WFL for tasks assessed/performed                             Following commands: Intact        Cueing Cueing Techniques: Verbal cues     General Comments General comments (skin integrity, edema, etc.): 99/61 supine,88/65 sitting; couldnt get standing BP due to dizzin3ess and pt sat down 92/56 with HR 101    Exercises     Assessment/Plan    PT Assessment Patient needs continued PT services  PT Problem List Decreased mobility;Decreased strength;Decreased range of motion;Decreased activity tolerance;Decreased knowledge of use of DME;Decreased safety awareness;Decreased knowledge of precautions;Cardiopulmonary status limiting activity       PT Treatment Interventions DME instruction;Gait training;Functional mobility training;Therapeutic activities;Therapeutic exercise;Balance training;Patient/family education;Stair training    PT Goals (Current goals can be found in the Care Plan section)  Acute Rehab PT Goals Patient Stated Goal: to go home PT Goal Formulation: With patient Time For Goal Achievement: 11/14/24 Potential to Achieve Goals: Good    Frequency Min 2X/week     Co-evaluation               AM-PAC PT 6 Clicks Mobility  Outcome Measure Help needed turning from your back to your side while in a flat bed without using bedrails?: A Little Help needed moving from lying on your back to sitting on the side of a flat bed without using bedrails?: A Little Help needed moving to and from a bed to a chair (including a wheelchair)?: A Little Help needed standing up from a chair using your arms (e.g., wheelchair or bedside chair)?: A Little Help needed to walk in hospital room?: Total Help needed climbing 3-5 steps with a railing? : Total 6 Click Score: 14    End of Session Equipment Utilized During Treatment: Gait belt Activity Tolerance: Patient limited by fatigue Patient left: with call bell/phone within reach (on stretcher) Nurse Communication: Mobility status PT Visit Diagnosis: Unsteadiness on feet (R26.81);Muscle weakness (generalized) (M62.81)     Time: 8886-8876 PT Time Calculation (min) (ACUTE ONLY): 10 min   Charges:   PT Evaluation $PT Eval Moderate Complexity: 1 Mod   PT General Charges $$ ACUTE PT VISIT: 1 Visit         Larayah Clute M,PT Acute Rehab Services [phone]   Stephane JULIANNA Bevel 10/31/2024, 1:40 PM

## 2024-10-31 NOTE — Evaluation (Signed)
 Occupational Therapy Evaluation Patient Details Name: Ana Ryan MRN: [id number] DOB: 08-15-1956 Today's Date: 10/31/2024   History of Present Illness   68 y/o F presenting to ED on 11/12 with SOB x3 weeks, cough, diarrhea, admitted for generalized weakness.    PMH includes alcohol use, HTN, ARF, GERD, CKD IV     Clinical Impressions Pt reports living alone, ind at baseline with ADLs/functional mobility. Pt currently reports feeling slightly weaker than baseline, but improved since admission. Pt currently needs up to min A for ADLs, CGA for bed mobility and CGA for transfers without AD. Pt presenting with impairments listed below, will follow acutely. Anticipate no OT follow up needs at d/c.      If plan is discharge home, recommend the following:   A little help with walking and/or transfers;A little help with bathing/dressing/bathroom;Assistance with cooking/housework;Direct supervision/assist for medications management;Direct supervision/assist for financial management;Assist for transportation;Help with stairs or ramp for entrance     Functional Status Assessment   Patient has had a recent decline in their functional status and demonstrates the ability to make significant improvements in function in a reasonable and predictable amount of time.     Equipment Recommendations   None recommended by OT     Recommendations for Other Services         Precautions/Restrictions   Restrictions Weight Bearing Restrictions Per Provider Order: No     Mobility Bed Mobility Overal bed mobility: Needs Assistance Bed Mobility: Sidelying to Sit, Sit to Sidelying   Sidelying to sit: Contact guard assist     Sit to sidelying: Contact guard assist      Transfers Overall transfer level: Needs assistance Equipment used: 1 person hand held assist Transfers: Sit to/from Stand Sit to Stand: Contact guard assist                  Balance Overall balance  assessment: Mild deficits observed, not formally tested                                         ADL either performed or assessed with clinical judgement   ADL Overall ADL's : Needs assistance/impaired Eating/Feeding: Set up   Grooming: Set up   Upper Body Bathing: Minimal assistance   Lower Body Bathing: Minimal assistance   Upper Body Dressing : Contact guard assist   Lower Body Dressing: Contact guard assist   Toilet Transfer: Contact guard assist;Ambulation   Toileting- Clothing Manipulation and Hygiene: Contact guard assist       Functional mobility during ADLs: Contact guard assist       Vision Baseline Vision/History: 1 Wears glasses Vision Assessment?: No apparent visual deficits     Perception Perception: Not tested       Praxis Praxis: Not tested       Pertinent Vitals/Pain Pain Assessment Pain Assessment: No/denies pain     Extremity/Trunk Assessment Upper Extremity Assessment Upper Extremity Assessment: Generalized weakness   Lower Extremity Assessment Lower Extremity Assessment: Defer to PT evaluation   Cervical / Trunk Assessment Cervical / Trunk Assessment: Normal   Communication Communication Communication: No apparent difficulties   Cognition Arousal: Alert Behavior During Therapy: WFL for tasks assessed/performed Cognition: No apparent impairments                               Following commands:  Intact       Cueing  General Comments   Cueing Techniques: Verbal cues  VSS on RA   Exercises     Shoulder Instructions      Home Living Family/patient expects to be discharged to:: Private residence Living Arrangements: Alone Available Help at Discharge: Family;Available PRN/intermittently Type of Home: House Home Access: Stairs to enter [employer] of Steps: 3 back 5 front Entrance Stairs-Rails: Can reach both (in back) Home Layout: Multi-level Alternate Level Stairs-Number of  Steps: flight, but reports mainly staying on levels 1 & 2   Bathroom Shower/Tub: Walk-in shower         Home Equipment: Shower seat - built [employer] (2 wheels)          Prior Functioning/Environment Prior Level of Function : Independent/Modified Independent;Driving             Mobility Comments: ind ADLs Comments: ind    OT Problem List: Decreased strength;Decreased range of motion;Impaired balance (sitting and/or standing);Decreased activity tolerance;Cardiopulmonary status limiting activity   OT Treatment/Interventions: Self-care/ADL training;Therapeutic exercise;Energy conservation;DME and/or AE instruction;Therapeutic activities;Patient/family education;Balance training      OT Goals(Current goals can be found in the care plan section)   Acute Rehab OT Goals Patient Stated Goal: none stated OT Goal Formulation: With patient Time For Goal Achievement: 11/14/24 Potential to Achieve Goals: Good   OT Frequency:  Min 2X/week    Co-evaluation              AM-PAC OT 6 Clicks Daily Activity     Outcome Measure Help from another person eating meals?: A Little Help from another person taking care of personal grooming?: A Little Help from another person toileting, which includes using toliet, bedpan, or urinal?: A Little Help from another person bathing (including washing, rinsing, drying)?: A Little Help from another person to put on and taking off regular upper body clothing?: A Little Help from another person to put on and taking off regular lower body clothing?: A Little 6 Click Score: 18   End of Session Equipment Utilized During Treatment: Gait belt Nurse Communication: Mobility status  Activity Tolerance: Patient tolerated treatment well Patient left: in bed;with call bell/phone within reach  OT Visit Diagnosis: Unsteadiness on feet (R26.81);Other abnormalities of gait and mobility (R26.89);Muscle weakness (generalized) (M62.81)                 Time: 8958-8940 OT Time Calculation (min): 18 min Charges:  OT General Charges $OT Visit: 1 Visit OT Evaluation $OT Eval Low Complexity: 1 Low  Tamarcus Condie K, OTD, OTR/L [employer] [facility] Acute Rehab [phone]   Venna Berberich K Koonce 10/31/2024, 11:08 AM

## 2024-10-31 NOTE — ED Notes (Signed)
Pt in bed with eyes closed, resps even and unlabored.  

## 2024-11-01 ENCOUNTER — Inpatient Hospital Stay (HOSPITAL_COMMUNITY)

## 2024-11-01 DIAGNOSIS — R0609 Other forms of dyspnea: Secondary | ICD-10-CM | POA: Diagnosis not present

## 2024-11-01 DIAGNOSIS — N3000 Acute cystitis without hematuria: Secondary | ICD-10-CM | POA: Diagnosis not present

## 2024-11-01 DIAGNOSIS — R531 Weakness: Secondary | ICD-10-CM | POA: Diagnosis not present

## 2024-11-01 DIAGNOSIS — F1011 Alcohol abuse, in remission: Secondary | ICD-10-CM | POA: Diagnosis not present

## 2024-11-01 DIAGNOSIS — R7401 Elevation of levels of liver transaminase levels: Secondary | ICD-10-CM | POA: Diagnosis not present

## 2024-11-01 LAB — CBC
HCT: 21.9 % — ABNORMAL LOW (ref 36.0–46.0)
Hemoglobin: 7 g/dL — ABNORMAL LOW (ref 12.0–15.0)
MCH: 32.6 pg (ref 26.0–34.0)
MCHC: 32 g/dL (ref 30.0–36.0)
MCV: 101.9 fL — ABNORMAL HIGH (ref 80.0–100.0)
Platelets: 227 K/uL (ref 150–400)
RBC: 2.15 MIL/uL — ABNORMAL LOW (ref 3.87–5.11)
RDW: 15.1 % (ref 11.5–15.5)
WBC: 4.8 K/uL (ref 4.0–10.5)
nRBC: 0.8 % — ABNORMAL HIGH (ref 0.0–0.2)

## 2024-11-01 LAB — COMPREHENSIVE METABOLIC PANEL WITH GFR
ALT: 53 U/L — ABNORMAL HIGH (ref 0–44)
AST: 64 U/L — ABNORMAL HIGH (ref 15–41)
Albumin: 1.8 g/dL — ABNORMAL LOW (ref 3.5–5.0)
Alkaline Phosphatase: 139 U/L — ABNORMAL HIGH (ref 38–126)
Anion gap: 12 (ref 5–15)
BUN: 20 mg/dL (ref 8–23)
CO2: 17 mmol/L — ABNORMAL LOW (ref 22–32)
Calcium: 7.9 mg/dL — ABNORMAL LOW (ref 8.9–10.3)
Chloride: 100 mmol/L (ref 98–111)
Creatinine, Ser: 2.92 mg/dL — ABNORMAL HIGH (ref 0.44–1.00)
GFR, Estimated: 17 mL/min — ABNORMAL LOW (ref >60)
Glucose, Bld: 84 mg/dL (ref 70–99)
Potassium: 3.8 mmol/L (ref 3.5–5.1)
Sodium: 129 mmol/L — ABNORMAL LOW (ref 135–145)
Total Bilirubin: 1.1 mg/dL (ref 0.0–1.2)
Total Protein: 3.9 g/dL — ABNORMAL LOW (ref 6.5–8.1)

## 2024-11-01 LAB — ECHOCARDIOGRAM COMPLETE
Area-P 1/2: 4.57 cm2
Height: 62 in
S' Lateral: 2.8 cm
Weight: 2493.84 [oz_av]

## 2024-11-01 LAB — TECHNOLOGIST SMEAR REVIEW: Plt Morphology: NORMAL

## 2024-11-01 MED ORDER — FERROUS SULFATE 325 (65 FE) MG PO TABS
325.0000 mg | ORAL_TABLET | Freq: Every day | ORAL | Status: DC
  Administered 2024-11-02 – 2024-11-06 (×5): 325 mg via ORAL
  Filled 2024-11-01 (×5): qty 1

## 2024-11-01 MED ORDER — ALBUTEROL SULFATE (2.5 MG/3ML) 0.083% IN NEBU: 3.0000 mL | INHALATION_SOLUTION | Freq: Four times a day (QID) | RESPIRATORY_TRACT | Status: DC | PRN

## 2024-11-01 MED ORDER — BUDESONIDE 0.25 MG/2ML IN SUSP
0.2500 mg | Freq: Two times a day (BID) | RESPIRATORY_TRACT | Status: DC
  Administered 2024-11-01 – 2024-11-05 (×9): 0.25 mg via RESPIRATORY_TRACT
  Filled 2024-11-01 (×11): qty 2

## 2024-11-01 MED ORDER — ARFORMOTEROL TARTRATE 15 MCG/2ML IN NEBU
15.0000 ug | INHALATION_SOLUTION | Freq: Two times a day (BID) | RESPIRATORY_TRACT | Status: DC
  Administered 2024-11-01 – 2024-11-05 (×9): 15 ug via RESPIRATORY_TRACT
  Filled 2024-11-01 (×11): qty 2

## 2024-11-01 MED ORDER — GUAIFENESIN ER 600 MG PO TB12
600.0000 mg | ORAL_TABLET | Freq: Two times a day (BID) | ORAL | Status: DC
  Administered 2024-11-01 – 2024-11-06 (×11): 600 mg via ORAL
  Filled 2024-11-01 (×11): qty 1

## 2024-11-01 MED ORDER — PANTOPRAZOLE SODIUM 40 MG PO TBEC
40.0000 mg | DELAYED_RELEASE_TABLET | Freq: Every day | ORAL | Status: DC
  Administered 2024-11-01: 40 mg via ORAL
  Filled 2024-11-01: qty 1

## 2024-11-01 MED ORDER — DARBEPOETIN ALFA 40 MCG/0.4ML IJ SOSY
40.0000 ug | PREFILLED_SYRINGE | INTRAMUSCULAR | Status: DC
  Administered 2024-11-01: 40 ug via SUBCUTANEOUS
  Filled 2024-11-01: qty 0.4

## 2024-11-01 NOTE — Progress Notes (Signed)
 PROGRESS NOTE        PATIENT DETAILS Name: Ana Ryan Age: 68 y.o. Sex: female Date of Birth: Nov 09, 1956 Admit Date: 10/30/2024 Admitting Physician Eva KATHEE Pore, DO ERE:Hpoazmu, Charlie CROME, MD  Brief Summary: Patient is a 68 y.o.  female with history of asthma, CKD 4, HTN-who presented with almost 1 month history of gradually worsening generalized weakness, exertional dyspnea, coughing spells-she was found to have possible bronchopneumonia, worsening anemia and subsequently admitted to the hospitalist service.  Significant events: 11/12>> admit to [facility]  Significant studies: 11/12>> CT chest: Mild bronchiectasis in bilateral lungs-right> left, scattered small solid pulmonary nodules.   11/13>> CT abdomen/pelvis: Hepatic steatosis, multiple indeterminate renal masses.  Significant microbiology data: 11/12>> urine culture: Gram-negative rod  Procedures: None  Consults: None   Subjective: Lying comfortably in bed-denies any chest pain or shortness of breath.  Continues to have intermittent coughing spells.  Objective: Vitals: Blood pressure 102/62, pulse 86, temperature 97.9 F (36.6 C), temperature source Oral, resp. rate 18, height 5' 2 (1.575 m), weight 70.7 kg, SpO2 93%.   Exam: Gen Exam:Alert awake-not in any distress HEENT:atraumatic, normocephalic Chest: B/L clear to auscultation anteriorly CVS:S1S2 regular Abdomen:soft non tender, non distended Extremities: Right leg swelling Neurology: Non focal Skin: no rash  Pertinent Labs/Radiology:    Latest Ref Rng & Units 11/01/2024    2:58 AM 10/31/2024    5:17 AM 10/30/2024   10:46 PM  CBC  WBC 4.0 - 10.5 K/uL 4.8  4.9  6.4   Hemoglobin 12.0 - 15.0 g/dL 7.0  7.6  8.3   Hematocrit 36.0 - 46.0 % 21.9  24.5  26.7   Platelets 150 - 400 K/uL 227  204  215     Lab Results  Component Value Date   NA 129 (L) 11/01/2024   K 3.8 11/01/2024   CL 100 11/01/2024   CO2 17 (L)  11/01/2024      Assessment/Plan: Generalized weakness Ongoing for at least a month if not longer-Complains of exertional dyspnea as well Suspect this is due to worsening anemia in the setting of CKD 4-with some contributions from PNA.  Although her TSH is slightly elevated-suspect her level of weakness/dyspnea is disproportionate to her level of mildly elevated TSH. Check echo but will start iron/iron therapy for anemia likely related to CKD-continue antibiotics for PNA Mobilize with PT/OT.  Bronchopneumonia/bronchiectasis Reportedly-several years ago was on Advair-recently started on albuterol  inhaler by her PCP due to persistent coughing spells/exertional dyspnea.  Not formally diagnosed with asthma in the past. Currently not wheezing Continue empiric antibiotics Add Mucinex Add bronchodilators Incentive spirometry/flutter valve. Will need outpatient PCCM follow-up for formal PFTs.  Asymptomatic bacteriuria Urine cultures positive but-she has no dysuria/polyuria Doubt UTI-suspect this is more asymptomatic bacteriuria Irrespective-already on IV Rocephin .  Anemia No evidence of blood loss-suspect this is related to CKD 4-although this is macrocytic (macrocytosis could be related to EtOH use-as B12/folic acid levels are stable). Discussed with pharmacy Starting Aranesp/oral iron Type and screen Ambulate with therapy-if she is significantly short of breath with ambulation-May need PRBC transfusion.  Or if hemoglobin drops further-Will need PRBC transfusion.  CKD 4 Creatinine close to baseline Follows with Dr. Princeton an outpatient.  Hyponatremia Mild-appears to be a chronic issue Will follow for now.  Borderline hypotension Unclear etiology-does not appear to be septic-no evidence of volume/blood loss.  Random cortisol stable Check echo but no overt signs of CHF Continue low-dose midodrine and follow-thankfully she is asymptomatic.  Transaminitis Mild Acute hepatitis  serology negative CT imaging shows significant hepatic steatosis For now follow-but suspect will need GI evaluation but this can be done as an outpatient.  Subclinical hypothyroidism TSH mildly elevated but free T4 normal Doubt her symptoms are from hypothyroidism-suspect anemia playing more of a big role Hold off on starting Synthroid  Right leg swelling Check Doppler-per patient-this is a chronic issue.  EtOH use Recently relapsed (has quit and relapsed multiple times in the past) Drinks vodka/orange juice-3 large drinks every day-1 bottle of vodka (750 mL) last for 3 days. Claims last drink was this past Monday-11/10-watch for signs of withdrawal-currently none.  Multiple renal masses Incidental finding on CT abdomen Will plan on MRI kidneys prior to discharge (per patient request)   Code status:   Code Status: Full Code   DVT Prophylaxis: SCDs Start: 10/30/24 2357   Family Communication: Patient had her daughter [name] on the phone while I was in the room rounding on her.   Disposition Plan: Status is: Inpatient Remains inpatient appropriate because: Severity of illness   Planned Discharge Destination:Home   Diet: Diet Order             Diet regular Room service appropriate? Yes; Fluid consistency: Thin  Diet effective now                     Antimicrobial agents: Anti-infectives (From admission, onward)    Start     Dose/Rate Route Frequency Ordered Stop   10/31/24 1800  cefTRIAXone  (ROCEPHIN ) 1 g in sodium chloride  0.9 % 100 mL IVPB        1 g 200 mL/hr over 30 Minutes Intravenous Every 24 hours 10/31/24 0001     10/31/24 1030  azithromycin  (ZITHROMAX ) tablet 500 mg        500 mg Oral Daily 10/31/24 1018     10/30/24 2300  cefTRIAXone  (ROCEPHIN ) 1 g in sodium chloride  0.9 % 100 mL IVPB        1 g 200 mL/hr over 30 Minutes Intravenous  Once 10/30/24 2252 10/31/24 0007        MEDICATIONS: Scheduled Meds:  azithromycin   500 mg Oral Daily    darbepoetin (ARANESP) injection - DIALYSIS  40 mcg Subcutaneous Q Fri-1800   [START ON 11/02/2024] ferrous sulfate  325 mg Oral Q breakfast   folic acid  1 mg Oral Daily   Influenza vac split trivalent PF  0.5 mL Intramuscular Tomorrow-1000   midodrine  2.5 mg Oral TID WC   multivitamin  1 tablet Oral Daily   thiamine  100 mg Oral Daily   Continuous Infusions:  cefTRIAXone  (ROCEPHIN )  IV 1 g (10/31/24 1725)   lactated ringers  125 mL/hr at 11/01/24 0206   PRN Meds:.acetaminophen  **OR** acetaminophen , albuterol , benzonatate , melatonin, ondansetron  (ZOFRAN ) IV   I have personally reviewed following labs and imaging studies  LABORATORY DATA: CBC: Recent Labs  Lab 10/30/24 2246 10/31/24 0517 11/01/24 0258  WBC 6.4 4.9 4.8  NEUTROABS  --  3.3  --   HGB 8.3* 7.6* 7.0*  HCT 26.7* 24.5* 21.9*  MCV 102.7* 104.3* 101.9*  PLT 215 204 227    Basic Metabolic Panel: Recent Labs  Lab 10/30/24 2022 10/30/24 2246 10/31/24 0517 11/01/24 0258  NA 128*  --  127* 129*  K 4.4  --  4.2 3.8  CL 95*  --  97* 100  CO2 19*  --  16* 17*  GLUCOSE 117*  --  95 84  BUN 22  --  21 20  CREATININE 2.96*  --  2.83* 2.92*  CALCIUM  8.1*  --  8.0* 7.9*  MG  --  1.9 1.7  --     GFR: Estimated Creatinine Clearance: 16.7 mL/min (A) (by C-G formula based on SCr of 2.92 mg/dL (H)).  Liver Function Tests: Recent Labs  Lab 10/30/24 2022 10/31/24 0517 11/01/24 0258  AST 76* 61* 64*  ALT 70* 60* 53*  ALKPHOS 171* 140* 139*  BILITOT 2.2* 1.5* 1.1  PROT 4.5* 3.8* 3.9*  ALBUMIN 2.1* 1.9* 1.8*   Recent Labs  Lab 10/30/24 2022  LIPASE 31   No results for input(s): AMMONIA in the last 168 hours.  Coagulation Profile: Recent Labs  Lab 10/30/24 2003 10/31/24 0517  INR 1.1 1.0    Cardiac Enzymes: Recent Labs  Lab 10/31/24 0517  CKTOTAL 146    BNP (last 3 results) No results for input(s): PROBNP in the last 8760 hours.  Lipid Profile: No results for input(s): CHOL, HDL,  LDLCALC, TRIG, CHOLHDL, LDLDIRECT in the last 72 hours.  Thyroid Function Tests: Recent Labs    10/31/24 0517 10/31/24 1159  TSH 12.223*  --   FREET4  --  0.80    Anemia Panel: Recent Labs    10/31/24 0517  VITAMINB12 1,045*  FOLATE 8.5  FERRITIN 1,374*  TIBC 106*  IRON 100  RETICCTPCT 1.8    Urine analysis:    Component Value Date/Time   COLORURINE AMBER (A) 10/30/2024 2235   APPEARANCEUR CLOUDY (A) 10/30/2024 2235   LABSPEC 1.011 10/30/2024 2235   PHURINE 5.0 10/30/2024 2235   GLUCOSEU NEGATIVE 10/30/2024 2235   HGBUR NEGATIVE 10/30/2024 2235   BILIRUBINUR NEGATIVE 10/30/2024 2235   BILIRUBINUR negative 02/13/2019 1437   KETONESUR NEGATIVE 10/30/2024 2235   PROTEINUR NEGATIVE 10/30/2024 2235   UROBILINOGEN 0.2 02/13/2019 1437   UROBILINOGEN 0.2 09/08/2010 1135   NITRITE NEGATIVE 10/30/2024 2235   LEUKOCYTESUR LARGE (A) 10/30/2024 2235    Sepsis Labs: Lactic Acid, Venous    Component Value Date/Time   LATICACIDVEN 0.5 02/01/2019 1040    MICROBIOLOGY: Recent Results (from the past 240 hours)  Urine Culture     Status: Abnormal (Preliminary result)   Collection Time: 10/30/24 10:35 PM   Specimen: Urine, Random  Result Value Ref Range Status   Specimen Description URINE, RANDOM  Final   Special Requests NONE Reflexed from [id number]  Final   Culture (A)  Final    >=100,000 COLONIES/mL GRAM NEGATIVE RODS SUSCEPTIBILITIES TO FOLLOW Performed at [facility], [address], [place], [place] [address]    Report Status PENDING  Incomplete    RADIOLOGY STUDIES/RESULTS: CT ABDOMEN PELVIS WO CONTRAST Result Date: 10/31/2024 EXAM: CT ABDOMEN AND PELVIS WITHOUT CONTRAST 10/31/2024 04:00:12 AM TECHNIQUE: CT of the abdomen and pelvis was performed without the administration of intravenous contrast. Multiplanar reformatted images are provided for review. Automated exposure control, iterative reconstruction, and/or weight-based adjustment of the mA/kV  was utilized to reduce the radiation dose to as low as reasonably achievable. COMPARISON: CT of the abdomen and pelvis dated 08/25/2019. CLINICAL HISTORY: Kidney failure, acute. FINDINGS: LOWER CHEST: There are new streaky opacities present within the periphery of the lower lobes bilaterally, more pronounced on the right. There is also mild bronchiectasis present, also worse in the right lung base. LIVER: There is advanced fatty infiltration of the  liver which measures approximately 25 cm in length in the midclavicular line. GALLBLADDER AND BILE DUCTS: Gallbladder is unremarkable. No biliary ductal dilatation. SPLEEN: No acute abnormality. PANCREAS: No acute abnormality. ADRENAL GLANDS: No acute abnormality. KIDNEYS, URETERS AND BLADDER: There is a round exophytic nodule arising posteriorly from the superior pole of the right kidney, measuring approximately 20 mm in diameter. There also appears to be a nodule arising from the lower pole of the right kidney, image 46 of series 3, measuring approximately 2.3 x 1.4 cm. The left kidney is atrophic. There is a 12 mm exophytic nodule arising posterolaterally from the superior pole of the left kidney. Correlation with a renal sonogram is suggested. No stones in the kidneys or ureters. No hydronephrosis. No perinephric or periureteral stranding. Urinary bladder is unremarkable. GI AND BOWEL: Stomach demonstrates no acute abnormality. There is no bowel obstruction. The patient also appears to be status post appendectomy. PERITONEUM AND RETROPERITONEUM: No ascites. No free air. VASCULATURE: Aorta is normal in caliber. LYMPH NODES: No lymphadenopathy. REPRODUCTIVE ORGANS: The patient is status post hysterectomy and bilateral salpingo-oophorectomy. BONES AND SOFT TISSUES: The patient is status post bilateral total hip arthroplasties. No acute osseous abnormality. No focal soft tissue abnormality. IMPRESSION: 1. New streaky opacities in the periphery of the lower lobes  bilaterally, more pronounced on the right, and mild bronchiectasis, worse in the right lung base 2. Advanced hepatic steatosis 3. Multiple indeterminate renal masses: right superior pole exophytic 20 mm, right lower pole 2.3 x 1.4 cm, and left superior pole exophytic 12 mm in an atrophic kidney. Recommend prompt renal protocol MRI or CT without and with contrast for characterization. If any lesion is confirmed as a simple cyst (Bosniak I/II), no further follow-up is needed; otherwise manage per Bosniak/renal mass guidelines Electronically signed by: Evalene Coho MD 10/31/2024 04:15 AM EST RP Workstation: [id number]   CT CHEST WO CONTRAST Result Date: 10/30/2024 CLINICAL DATA:  Shortness of breath productive cough EXAM: CT CHEST WITHOUT CONTRAST TECHNIQUE: Multidetector CT imaging of the chest was performed following the standard protocol without IV contrast. RADIATION DOSE REDUCTION: This exam was performed according to the departmental dose-optimization program which includes automated exposure control, adjustment of the mA and/or kV according to patient size and/or use of iterative reconstruction technique. COMPARISON:  Chest x-ray 10/30/2024, CT 05/29/2019 FINDINGS: Cardiovascular: Limited without intravenous contrast. Normal cardiac size. No pericardial effusion. Nonaneurysmal aorta Mediastinum/Nodes: Patent trachea. No thyroid mass. No suspicious lymph nodes. Esophagus within normal limits. Lungs/Pleura: Mild bronchiectasis within the bilateral lungs. Right greater than left subpleural reticulations and bandlike densities with areas of mild distortion, favored to represent post infectious or post inflammatory scarring. No acute confluent airspace disease, pleural effusion or pneumothorax. Scattered small solid pulmonary nodules measuring less than 5 mm, for example punctate 2 mm superior left lower lobe pulmonary nodule on series 3, image 62. 3 mm right upper lobe pulmonary nodule on series 3, image 61.  Upper Abdomen: Elevation of the right diaphragm. Marked low-density liver consistent with steatosis. The liver is enlarged, measuring at least 19 cm craniocaudal with the inferior extent non included. The kidneys are atrophic. Complex lesions within the bilateral kidneys, on the right measuring 16 mm and on the left measuring 12 mm. Musculoskeletal: No acute osseous abnormality. IMPRESSION: 1. Mild bronchiectasis within the bilateral lungs with right greater than left subpleural reticulations and bandlike densities and areas of mild distortion, favored to represent post infectious or post inflammatory scarring. No acute confluent airspace disease. 2. Scattered small  solid pulmonary nodules measuring less than 5 mm. No follow-up needed if patient is low-risk (and has no known or suspected primary neoplasm). Non-contrast chest CT can be considered in 12 months if patient is high-risk. This recommendation follows the consensus statement: Guidelines for Management of Incidental Pulmonary Nodules Detected on CT Images: From the Fleischner Society 2017; Radiology 2017; 284:228-243. 3. Hepatomegaly with marked hepatic steatosis. 4. Atrophic kidneys with suspected complex lesions within the bilateral kidneys, incompletely characterized on this noncontrast study and also incompletely visualized. Consider initial evaluation with nonemergent renal ultrasound though renal CT or MRI might ultimately be required. Electronically Signed   By: Luke Bun M.D.   On: 10/30/2024 21:44   DG Chest Portable 1 View Result Date: 10/30/2024 EXAM: 1 VIEW(S) XRAY OF THE CHEST 10/30/2024 08:30:00 PM COMPARISON: Comparison with 02/01/2019. CLINICAL HISTORY: shob shob FINDINGS: LUNGS AND PLEURA: Streak opacities in the right mid and lower lung and left lower lung favor atelectasis or scarring. No pleural effusion. No pneumothorax. HEART AND MEDIASTINUM: Stable cardiomediastinal silhouette. BONES AND SOFT TISSUES: No acute osseous  abnormality. IMPRESSION: 1. Streaky opacities in the right mid/lower and left lower lung, favoring atelectasis or scarring. Infiltrates not excluded. Electronically signed by: Norman Gatlin MD 10/30/2024 08:32 PM EST RP Workstation: [id number]     LOS: 1 day   Donalda Applebaum, MD  [facility]    To contact the attending provider between 7A-7P or the covering provider during after hours 7P-7A, please log into the web site [email or web address] and access using universal [place] password for that web site. If you do not have the password, please call the hospital operator.  11/01/2024, 10:01 AM

## 2024-11-01 NOTE — TOC Progression Note (Addendum)
 Transition of Care [facility]) - Progression Note    Patient Details  Name: JOSEE SPEECE MRN: [id number] Date of Birth: 27-Oct-1956  [facility] CM/SW Contact  Corean JAYSON Canary, RN Phone Number: 11/01/2024, 3:15 PM  Clinical Narrative:    Sent requests to [facility] agencies via Epic, awaiting acceptances Will update pa [facility]- Declined HH orders in Epic Expected Discharge Plan: Home w Home Health Services Barriers to Discharge: Continued Medical Work up               Expected Discharge Plan and Services In-house Referral: Clinical Social Work Discharge Planning Services: CM Consult                                           Social Drivers of Health (SDOH) Interventions SDOH Screenings   Food Insecurity: No Food Insecurity (10/31/2024)  Housing: Low Risk  (10/31/2024)  Transportation Needs: No Transportation Needs (10/31/2024)  Utilities: Not At Risk (10/31/2024)  Alcohol Screen: Low Risk  (05/26/2022)  Depression (PHQ2-9): Low Risk  (05/26/2022)  Financial Resource Strain: Low Risk  (09/08/2023)   Received from [facility]  Physical Activity: Insufficiently Active (09/08/2023)   Received from [facility]  Social Connections: Moderately Isolated (10/31/2024)  Tobacco Use: Medium Risk (10/31/2024)  Health Literacy: Adequate Health Literacy (09/08/2023)   Received from [facility]    Readmission Risk Interventions     No data to display

## 2024-11-01 NOTE — Plan of Care (Signed)
   Problem: Education: Goal: Knowledge of General Education information will improve Description: Including pain rating scale, medication(s)/side effects and non-pharmacologic comfort measures Outcome: Progressing   Problem: Activity: Goal: Risk for activity intolerance will decrease Outcome: Progressing

## 2024-11-01 NOTE — Plan of Care (Signed)

## 2024-11-02 LAB — URINE CULTURE: Culture: >=100000 — AB

## 2024-11-02 LAB — CBC
HCT: 21.4 % — ABNORMAL LOW (ref 36.0–46.0)
Hemoglobin: 6.9 g/dL — CL (ref 12.0–15.0)
MCH: 32.7 pg (ref 26.0–34.0)
MCHC: 32.2 g/dL (ref 30.0–36.0)
MCV: 101.4 fL — ABNORMAL HIGH (ref 80.0–100.0)
Platelets: 231 K/uL (ref 150–400)
RBC: 2.11 MIL/uL — ABNORMAL LOW (ref 3.87–5.11)
RDW: 15.3 % (ref 11.5–15.5)
WBC: 5.3 K/uL (ref 4.0–10.5)
nRBC: 1.5 % — ABNORMAL HIGH (ref 0.0–0.2)

## 2024-11-02 LAB — BASIC METABOLIC PANEL WITH GFR
Anion gap: 12 (ref 5–15)
BUN: 19 mg/dL (ref 8–23)
CO2: 18 mmol/L — ABNORMAL LOW (ref 22–32)
Calcium: 7.8 mg/dL — ABNORMAL LOW (ref 8.9–10.3)
Chloride: 101 mmol/L (ref 98–111)
Creatinine, Ser: 2.82 mg/dL — ABNORMAL HIGH (ref 0.44–1.00)
GFR, Estimated: 18 mL/min — ABNORMAL LOW (ref >60)
Glucose, Bld: 90 mg/dL (ref 70–99)
Potassium: 3.3 mmol/L — ABNORMAL LOW (ref 3.5–5.1)
Sodium: 131 mmol/L — ABNORMAL LOW (ref 135–145)

## 2024-11-02 LAB — PREPARE RBC (CROSSMATCH)

## 2024-11-02 MED ORDER — LORAZEPAM 2 MG/ML IJ SOLN
1.0000 mg | Freq: Once | INTRAMUSCULAR | Status: AC | PRN
  Administered 2024-11-03: 1 mg via INTRAVENOUS
  Filled 2024-11-02: qty 1

## 2024-11-02 MED ORDER — ESOMEPRAZOLE MAGNESIUM 20 MG PO CPDR
20.0000 mg | DELAYED_RELEASE_CAPSULE | Freq: Every day | ORAL | Status: DC
  Administered 2024-11-02 (×2): 20 mg via ORAL
  Filled 2024-11-02 (×3): qty 1

## 2024-11-02 MED ORDER — ESOMEPRAZOLE MAGNESIUM 20 MG PO CPDR
40.0000 mg | DELAYED_RELEASE_CAPSULE | Freq: Every day | ORAL | Status: DC
  Administered 2024-11-03 – 2024-11-06 (×4): 40 mg via ORAL
  Filled 2024-11-02 (×6): qty 2

## 2024-11-02 MED ORDER — POTASSIUM CHLORIDE CRYS ER 20 MEQ PO TBCR
40.0000 meq | EXTENDED_RELEASE_TABLET | Freq: Once | ORAL | Status: AC
  Administered 2024-11-02: 40 meq via ORAL
  Filled 2024-11-02: qty 2

## 2024-11-02 MED ORDER — ESOMEPRAZOLE MAGNESIUM 20 MG PO CPDR: 20.0000 mg | DELAYED_RELEASE_CAPSULE | Freq: Every day | ORAL | Status: DC

## 2024-11-02 MED ORDER — SODIUM CHLORIDE 0.9% IV SOLUTION: Freq: Once | INTRAVENOUS | Status: AC

## 2024-11-02 NOTE — Progress Notes (Signed)
 PROGRESS NOTE        PATIENT DETAILS Name: Ana Ryan Age: 68 y.o. Sex: female Date of Birth: January 24, 1956 Admit Date: 10/30/2024 Admitting Physician Ana KATHEE Pore, DO ERE:Hpoazmu, Ana CROME, MD  Brief Summary: Patient is a 68 y.o.  female with history of asthma, CKD 4, HTN-who presented with almost 1 month history of gradually worsening generalized weakness, exertional dyspnea, coughing spells-she was found to have possible bronchopneumonia, worsening anemia and subsequently admitted to the hospitalist service.  Significant events: 11/12>> admit to [facility]  Significant studies: 11/12>> CT chest: Mild bronchiectasis in bilateral lungs-right> left, scattered small solid pulmonary nodules.   11/13>> CT abdomen/pelvis: Hepatic steatosis, multiple indeterminate renal masses. 11/14>> echo: EF 60-65%  Significant microbiology data: 11/12>> urine culture: E. coli.  Procedures: None  Consults: None   Subjective: No major issues overnight-lying comfortably in bed.  Hb down to 6.9-getting 1 unit of PRBC.  Continues to have some coughing spells.  Objective: Vitals: Blood pressure 109/66, pulse 88, temperature 98.6 F (37 C), temperature source Oral, resp. rate 18, height 5' 2 (1.575 m), weight 75.2 kg, SpO2 94%.   Exam: Gen Exam:Alert awake-not in any distress HEENT:atraumatic, normocephalic Chest: B/L clear to auscultation anteriorly CVS:S1S2 regular Abdomen:soft non tender, non distended Extremities: No edema but right leg appears slightly more swollen than left. Neurology: Non focal Skin: no rash  Pertinent Labs/Radiology:    Latest Ref Rng & Units 11/02/2024    3:24 AM 11/01/2024    2:58 AM 10/31/2024    5:17 AM  CBC  WBC 4.0 - 10.5 K/uL 5.3  4.8  4.9   Hemoglobin 12.0 - 15.0 g/dL 6.9  7.0  7.6   Hematocrit 36.0 - 46.0 % 21.4  21.9  24.5   Platelets 150 - 400 K/uL 231  227  204     Lab Results  Component Value Date   NA 131 (L)  11/02/2024   K 3.3 (L) 11/02/2024   CL 101 11/02/2024   CO2 18 (L) 11/02/2024      Assessment/Plan: Generalized weakness Ongoing for at least a month if not longer-Complains of exertional dyspnea as well Suspect this is due to worsening anemia in the setting of CKD 4-with some contributions from PNA.  Although her TSH is slightly elevated-suspect her level of weakness/dyspnea is disproportionate to her level of mildly elevated TSH. Echo is stable-no signs of CHF Continue treatment Etiologies-PNA being treated with antibiotics-getting 1 unit of PRBC this morning-on Aranesp/iron therapies as well. Continue to mobilize with nursing staff/PT/OT.  Bronchopneumonia/bronchiectasis Prior history of asthma. Reportedly-several years ago was on Advair-recently started on albuterol  inhaler by her PCP due to persistent coughing spells/exertional dyspnea.  Not formally diagnosed with asthma in the past. Currently not wheezing Continue empiric antibiotics Continue Mucinex Continue bronchodilators Incentive spirometry/flutter valve Will need outpatient follow-up for formal PFTs  Asymptomatic bacteriuria Urine cultures positive but-she has no dysuria/polyuria Doubt UTI-suspect this is more asymptomatic bacteriuria Irrespective-already on IV Rocephin .  Anemia No evidence of blood loss-suspect this is related to CKD 4-although this is macrocytic (macrocytosis could be related to EtOH use-as B12/folic acid levels are stable). Discussed with pharmacy-now on weekly Aranesp/iron therapy Slight drop in Hb today-being given 1 unit of PRBC. Follow CBC.  CKD 4 Creatinine close to baseline Follows with Dr. [place] an outpatient.  Hyponatremia Mild-appears to be a chronic  issue Slowly improving-doubt is a workup Will follow for now.  Borderline hypotension Unclear etiology-does not appear to be septic-no evidence of volume/blood loss.  Random cortisol stable Check echo but no overt signs of  CHF Continue low-dose midodrine and follow-thankfully she is asymptomatic.  Transaminitis Mild Acute hepatitis serology negative CT imaging shows significant hepatic steatosis For now follow-but suspect will need GI evaluation but this can be done as an outpatient.  Subclinical hypothyroidism TSH mildly elevated but free T4 normal Doubt her symptoms are from hypothyroidism-suspect anemia playing more of a big role Hold off on starting Synthroid Repeat TSH/free T4 as an outpatient in 4 to 6 weeks.  Right leg swelling Awaiting Doppler-ordered 11/14-not yet done-Will change order to anticipated discharge on 11/15.  Per patient-this is a chronic issue.  EtOH use Recently relapsed (has quit and relapsed multiple times in the past) Drinks vodka/orange juice-3 large drinks every day-1 bottle of vodka (750 mL) last for 3 days. Claims last drink was this past Monday-11/10-watch for signs of withdrawal-currently none.  Multiple renal masses Incidental finding on CT abdomen Will plan on MRI kidneys prior to discharge (per patient request)  Code status:   Code Status: Full Code   DVT Prophylaxis: SCDs Start: 10/30/24 2357   Family Communication: Patient had her daughter Ana Ryan) on the phone while I was in the room rounding on her.   Disposition Plan: Status is: Inpatient Remains inpatient appropriate because: Severity of illness   Planned Discharge Destination:Home   Diet: Diet Order             Diet regular Room service appropriate? Yes; Fluid consistency: Thin  Diet effective now                     Antimicrobial agents: Anti-infectives (From admission, onward)    Start     Dose/Rate Route Frequency Ordered Stop   10/31/24 1800  cefTRIAXone  (ROCEPHIN ) 1 g in sodium chloride  0.9 % 100 mL IVPB        1 g 200 mL/hr over 30 Minutes Intravenous Every 24 hours 10/31/24 0001     10/31/24 1030  azithromycin  (ZITHROMAX ) tablet 500 mg        500 mg Oral Daily 10/31/24  1018     10/30/24 2300  cefTRIAXone  (ROCEPHIN ) 1 g in sodium chloride  0.9 % 100 mL IVPB        1 g 200 mL/hr over 30 Minutes Intravenous  Once 10/30/24 2252 10/31/24 0007        MEDICATIONS: Scheduled Meds:  arformoterol  15 mcg Nebulization BID   azithromycin   500 mg Oral Daily   budesonide (PULMICORT) nebulizer solution  0.25 mg Nebulization BID   darbepoetin (ARANESP) injection - DIALYSIS  40 mcg Subcutaneous Q Fri-1800   esomeprazole   20 mg Oral Daily   ferrous sulfate  325 mg Oral Q breakfast   folic acid  1 mg Oral Daily   guaiFENesin  600 mg Oral BID   Influenza vac split trivalent PF  0.5 mL Intramuscular Tomorrow-1000   midodrine  2.5 mg Oral TID WC   multivitamin  1 tablet Oral Daily   thiamine  100 mg Oral Daily   Continuous Infusions:  cefTRIAXone  (ROCEPHIN )  IV 1 g (11/01/24 1721)   PRN Meds:.acetaminophen  **OR** acetaminophen , albuterol , benzonatate , melatonin, ondansetron  (ZOFRAN ) IV   I have personally reviewed following labs and imaging studies  LABORATORY DATA: CBC: Recent Labs  Lab 10/30/24 2246 10/31/24 0517 11/01/24 0258 11/02/24 0324  WBC 6.4 4.9 4.8 5.3  NEUTROABS  --  3.3  --   --   HGB 8.3* 7.6* 7.0* 6.9*  HCT 26.7* 24.5* 21.9* 21.4*  MCV 102.7* 104.3* 101.9* 101.4*  PLT 215 204 227 231    Basic Metabolic Panel: Recent Labs  Lab 10/30/24 2022 10/30/24 2246 10/31/24 0517 11/01/24 0258 11/02/24 0324  NA 128*  --  127* 129* 131*  K 4.4  --  4.2 3.8 3.3*  CL 95*  --  97* 100 101  CO2 19*  --  16* 17* 18*  GLUCOSE 117*  --  95 84 90  BUN 22  --  21 20 19   CREATININE 2.96*  --  2.83* 2.92* 2.82*  CALCIUM  8.1*  --  8.0* 7.9* 7.8*  MG  --  1.9 1.7  --   --     GFR: Estimated Creatinine Clearance: 17.9 mL/min (A) (by C-G formula based on SCr of 2.82 mg/dL (H)).  Liver Function Tests: Recent Labs  Lab 10/30/24 2022 10/31/24 0517 11/01/24 0258  AST 76* 61* 64*  ALT 70* 60* 53*  ALKPHOS 171* 140* 139*  BILITOT 2.2* 1.5* 1.1   PROT 4.5* 3.8* 3.9*  ALBUMIN 2.1* 1.9* 1.8*   Recent Labs  Lab 10/30/24 2022  LIPASE 31   No results for input(s): AMMONIA in the last 168 hours.  Coagulation Profile: Recent Labs  Lab 10/30/24 2003 10/31/24 0517  INR 1.1 1.0    Cardiac Enzymes: Recent Labs  Lab 10/31/24 0517  CKTOTAL 146    BNP (last 3 results) No results for input(s): PROBNP in the last 8760 hours.  Lipid Profile: No results for input(s): CHOL, HDL, LDLCALC, TRIG, CHOLHDL, LDLDIRECT in the last 72 hours.  Thyroid Function Tests: Recent Labs    10/31/24 0517 10/31/24 1159  TSH 12.223*  --   FREET4  --  0.80    Anemia Panel: Recent Labs    10/31/24 0517  VITAMINB12 1,045*  FOLATE 8.5  FERRITIN 1,374*  TIBC 106*  IRON 100  RETICCTPCT 1.8    Urine analysis:    Component Value Date/Time   COLORURINE AMBER (A) 10/30/2024 2235   APPEARANCEUR CLOUDY (A) 10/30/2024 2235   LABSPEC 1.011 10/30/2024 2235   PHURINE 5.0 10/30/2024 2235   GLUCOSEU NEGATIVE 10/30/2024 2235   HGBUR NEGATIVE 10/30/2024 2235   BILIRUBINUR NEGATIVE 10/30/2024 2235   BILIRUBINUR negative 02/13/2019 1437   KETONESUR NEGATIVE 10/30/2024 2235   PROTEINUR NEGATIVE 10/30/2024 2235   UROBILINOGEN 0.2 02/13/2019 1437   UROBILINOGEN 0.2 09/08/2010 1135   NITRITE NEGATIVE 10/30/2024 2235   LEUKOCYTESUR LARGE (A) 10/30/2024 2235    Sepsis Labs: Lactic Acid, Venous    Component Value Date/Time   LATICACIDVEN 0.5 02/01/2019 1040    MICROBIOLOGY: Recent Results (from the past 240 hours)  Urine Culture     Status: Abnormal   Collection Time: 10/30/24 10:35 PM   Specimen: Urine, Random  Result Value Ref Range Status   Specimen Description URINE, RANDOM  Final   Special Requests   Final    NONE Reflexed from [id number] Performed at [facility], [address] [place], [place] [address]    Culture >=100,000 COLONIES/mL ESCHERICHIA COLI (A)  Final   Report Status 11/02/2024 FINAL  Final    Organism ID, Bacteria ESCHERICHIA COLI (A)  Final      Susceptibility   Escherichia coli - MIC*    AMPICILLIN >=32 RESISTANT Resistant     CEFAZOLIN  (URINE) Value  in next row Resistant      >=32 RESISTANTThis is a modified FDA-approved test that has been validated and its performance characteristics determined by the reporting laboratory.  This laboratory is certified under the Clinical Laboratory Improvement Amendments CLIA as qualified to perform high complexity clinical laboratory testing.    CEFEPIME Value in next row Sensitive      >=32 RESISTANTThis is a modified FDA-approved test that has been validated and its performance characteristics determined by the reporting laboratory.  This laboratory is certified under the Clinical Laboratory Improvement Amendments CLIA as qualified to perform high complexity clinical laboratory testing.    ERTAPENEM Value in next row Sensitive      >=32 RESISTANTThis is a modified FDA-approved test that has been validated and its performance characteristics determined by the reporting laboratory.  This laboratory is certified under the Clinical Laboratory Improvement Amendments CLIA as qualified to perform high complexity clinical laboratory testing.    CEFTRIAXONE  Value in next row Sensitive      >=32 RESISTANTThis is a modified FDA-approved test that has been validated and its performance characteristics determined by the reporting laboratory.  This laboratory is certified under the Clinical Laboratory Improvement Amendments CLIA as qualified to perform high complexity clinical laboratory testing.    CIPROFLOXACIN Value in next row Sensitive      >=32 RESISTANTThis is a modified FDA-approved test that has been validated and its performance characteristics determined by the reporting laboratory.  This laboratory is certified under the Clinical Laboratory Improvement Amendments CLIA as qualified to perform high complexity clinical laboratory testing.    GENTAMICIN  Value in next row Resistant      >=32 RESISTANTThis is a modified FDA-approved test that has been validated and its performance characteristics determined by the reporting laboratory.  This laboratory is certified under the Clinical Laboratory Improvement Amendments CLIA as qualified to perform high complexity clinical laboratory testing.    NITROFURANTOIN  Value in next row Sensitive      >=32 RESISTANTThis is a modified FDA-approved test that has been validated and its performance characteristics determined by the reporting laboratory.  This laboratory is certified under the Clinical Laboratory Improvement Amendments CLIA as qualified to perform high complexity clinical laboratory testing.    TRIMETH /SULFA  Value in next row Resistant      >=32 RESISTANTThis is a modified FDA-approved test that has been validated and its performance characteristics determined by the reporting laboratory.  This laboratory is certified under the Clinical Laboratory Improvement Amendments CLIA as qualified to perform high complexity clinical laboratory testing.    AMPICILLIN/SULBACTAM Value in next row Resistant      >=32 RESISTANTThis is a modified FDA-approved test that has been validated and its performance characteristics determined by the reporting laboratory.  This laboratory is certified under the Clinical Laboratory Improvement Amendments CLIA as qualified to perform high complexity clinical laboratory testing.    PIP/TAZO Value in next row Intermediate      64 INTERMEDIATEThis is a modified FDA-approved test that has been validated and its performance characteristics determined by the reporting laboratory.  This laboratory is certified under the Clinical Laboratory Improvement Amendments CLIA as qualified to perform high complexity clinical laboratory testing.    MEROPENEM Value in next row Sensitive      64 INTERMEDIATEThis is a modified FDA-approved test that has been validated and its performance characteristics  determined by the reporting laboratory.  This laboratory is certified under the Clinical Laboratory Improvement Amendments CLIA as qualified to perform high complexity clinical  laboratory testing.    * >=100,000 COLONIES/mL ESCHERICHIA COLI    RADIOLOGY STUDIES/RESULTS: ECHOCARDIOGRAM COMPLETE Result Date: 11/01/2024    ECHOCARDIOGRAM REPORT   Patient Name:   FRIDA WAHLSTROM Date of Exam: 11/01/2024 Medical Rec #:  [id number]      Height:       62.0 in Accession #:    [id number]     Weight:       155.9 lb Date of Birth:  1956/09/28     BSA:          1.719 m Patient Age:    67 years       BP:           96/65 mmHg Patient Gender: F              HR:           98 bpm. Exam Location:  Inpatient Procedure: 2D Echo, Cardiac Doppler and Color Doppler (Both Spectral and Color            Flow Doppler were utilized during procedure). Indications:    Dyspnea  History:        Patient has no prior history of Echocardiogram examinations.                 Risk Factors:Hypertension and Dyslipidemia.  Sonographer:    Meagan Baucom RDCS, FE, PE Referring Phys: DONALDA HERO [facility] IMPRESSIONS  1. Left ventricular ejection fraction, by estimation, is 60 to 65%. The left ventricle has normal function. The left ventricle has no regional wall motion abnormalities. Indeterminate diastolic filling due to E-A fusion.  2. Right ventricular systolic function is normal. The right ventricular size is normal.  3. The mitral valve is normal in structure. No evidence of mitral valve regurgitation. No evidence of mitral stenosis.  4. The aortic valve is normal in structure. Aortic valve regurgitation is not visualized. No aortic stenosis is present.  5. The inferior vena cava is normal in size with greater than 50% respiratory variability, suggesting right atrial pressure of 3 mmHg. Comparison(s): No prior Echocardiogram. FINDINGS  Left Ventricle: Left ventricular ejection fraction, by estimation, is 60 to 65%. The left ventricle has normal  function. The left ventricle has no regional wall motion abnormalities. The left ventricular internal cavity size was normal in size. There is  no left ventricular hypertrophy. Indeterminate diastolic filling due to E-A fusion. Right Ventricle: The right ventricular size is normal. No increase in right ventricular wall thickness. Right ventricular systolic function is normal. Left Atrium: Left atrial size was normal in size. Right Atrium: Right atrial size was normal in size. Pericardium: There is no evidence of pericardial effusion. Presence of epicardial fat layer. Mitral Valve: The mitral valve is normal in structure. No evidence of mitral valve regurgitation. No evidence of mitral valve stenosis. Tricuspid Valve: The tricuspid valve is normal in structure. Tricuspid valve regurgitation is not demonstrated. No evidence of tricuspid stenosis. Aortic Valve: The aortic valve is normal in structure. Aortic valve regurgitation is not visualized. No aortic stenosis is present. Pulmonic Valve: The pulmonic valve was normal in structure. Pulmonic valve regurgitation is not visualized. No evidence of pulmonic stenosis. Aorta: The aortic root is normal in size and structure. Venous: The inferior vena cava is normal in size with greater than 50% respiratory variability, suggesting right atrial pressure of 3 mmHg. IAS/Shunts: No atrial level shunt detected by color flow Doppler.  LEFT VENTRICLE PLAX 2D LVIDd:  3.70 cm   Diastology LVIDs:         2.80 cm   LV e' medial:    7.51 cm/s LV PW:         1.00 cm   LV E/e' medial:  6.8 LV IVS:        1.10 cm   LV e' lateral:   9.14 cm/s LVOT diam:     2.20 cm   LV E/e' lateral: 5.6 LV SV:         78 LV SV Index:   46 LVOT Area:     3.80 cm  RIGHT VENTRICLE RV Basal diam:  2.90 cm RV Mid diam:    2.30 cm RV S prime:     26.60 cm/s TAPSE (M-mode): 1.3 cm LEFT ATRIUM             Index        RIGHT ATRIUM           Index LA Vol (A2C):   35.9 ml 20.88 ml/m  RA Area:     10.20 cm  LA Vol (A4C):   41.2 ml 23.96 ml/m  RA Volume:   18.90 ml  10.99 ml/m LA Biplane Vol: 38.6 ml 22.45 ml/m  AORTIC VALVE LVOT Vmax:   150.00 cm/s LVOT Vmean:  88.800 cm/s LVOT VTI:    0.206 m  AORTA Ao Root diam: 3.60 cm Ao Asc diam:  3.40 cm MITRAL VALVE               TRICUSPID VALVE MV Area (PHT): 4.57 cm    TR Peak grad:   4.3 mmHg MV Decel Time: 166 msec    TR Vmax:        104.00 cm/s MV E velocity: 50.80 cm/s MV A velocity: 80.70 cm/s  SHUNTS MV E/A ratio:  0.63        Systemic VTI:  0.21 m                            Systemic Diam: 2.20 cm Emeline Calender Electronically signed by Emeline Calender Signature Date/Time: 11/01/2024/6:02:12 PM    Final      LOS: 2 days   Donalda Applebaum, MD  [facility]    To contact the attending provider between 7A-7P or the covering provider during after hours 7P-7A, please log into the web site [email or web address] and access using universal [place] password for that web site. If you do not have the password, please call the hospital operator.  11/02/2024, 9:50 AM

## 2024-11-02 NOTE — Plan of Care (Signed)
   Problem: Education: Goal: Knowledge of General Education information will improve Description: Including pain rating scale, medication(s)/side effects and non-pharmacologic comfort measures Outcome: Progressing   Problem: Activity: Goal: Risk for activity intolerance will decrease Outcome: Progressing   Problem: Coping: Goal: Level of anxiety will decrease Outcome: Progressing

## 2024-11-02 NOTE — Progress Notes (Signed)
 Hgb is down to 6.9 this morning. Plan to transfuse 1 unit RBCs.

## 2024-11-03 ENCOUNTER — Inpatient Hospital Stay (HOSPITAL_COMMUNITY)

## 2024-11-03 DIAGNOSIS — R2241 Localized swelling, mass and lump, right lower limb: Secondary | ICD-10-CM | POA: Diagnosis not present

## 2024-11-03 DIAGNOSIS — R531 Weakness: Secondary | ICD-10-CM | POA: Diagnosis not present

## 2024-11-03 LAB — CBC
HCT: 25.8 % — ABNORMAL LOW (ref 36.0–46.0)
Hemoglobin: 8.3 g/dL — ABNORMAL LOW (ref 12.0–15.0)
MCH: 31.3 pg (ref 26.0–34.0)
MCHC: 32.2 g/dL (ref 30.0–36.0)
MCV: 97.4 fL (ref 80.0–100.0)
Platelets: 212 K/uL (ref 150–400)
RBC: 2.65 MIL/uL — ABNORMAL LOW (ref 3.87–5.11)
RDW: 18 % — ABNORMAL HIGH (ref 11.5–15.5)
WBC: 5.8 K/uL (ref 4.0–10.5)
nRBC: 2.1 % — ABNORMAL HIGH (ref 0.0–0.2)

## 2024-11-03 LAB — BASIC METABOLIC PANEL WITH GFR
Anion gap: 11 (ref 5–15)
BUN: 15 mg/dL (ref 8–23)
CO2: 19 mmol/L — ABNORMAL LOW (ref 22–32)
Calcium: 8 mg/dL — ABNORMAL LOW (ref 8.9–10.3)
Chloride: 102 mmol/L (ref 98–111)
Creatinine, Ser: 2.63 mg/dL — ABNORMAL HIGH (ref 0.44–1.00)
GFR, Estimated: 19 mL/min — ABNORMAL LOW (ref >60)
Glucose, Bld: 94 mg/dL (ref 70–99)
Potassium: 4.1 mmol/L (ref 3.5–5.1)
Sodium: 132 mmol/L — ABNORMAL LOW (ref 135–145)

## 2024-11-03 LAB — TYPE AND SCREEN
ABO/RH(D): A NEG
Antibody Screen: NEGATIVE
Unit division: 0

## 2024-11-03 LAB — BPAM RBC
Blood Product Expiration Date: 202512052359
ISSUE DATE / TIME: 202511150515
Unit Type and Rh: 600

## 2024-11-03 MED ORDER — GADOBUTROL 1 MMOL/ML IV SOLN
7.5000 mL | Freq: Once | INTRAVENOUS | Status: AC | PRN
  Administered 2024-11-03: 7.5 mL via INTRAVENOUS

## 2024-11-03 NOTE — TOC Progression Note (Signed)
 Transition of Care [facility]) - Progression Note    Patient Details  Name: Ana Ryan MRN: [id number] Date of Birth: 1956/02/14  [facility] CM/SW Contact  Marval Gell, RN Phone Number: 11/03/2024, 12:50 PM  Clinical Narrative:     [facility] accepted for [facility] services, updated HUB and AVS   Expected Discharge Plan: Home w Home Health Services Barriers to Discharge: Continued Medical Work up               Expected Discharge Plan and Services In-house Referral: Clinical Social Work Discharge Planning Services: CM Consult                                           Social Drivers of Health (SDOH) Interventions SDOH Screenings   Food Insecurity: No Food Insecurity (10/31/2024)  Housing: Low Risk  (10/31/2024)  Transportation Needs: No Transportation Needs (10/31/2024)  Utilities: Not At Risk (10/31/2024)  Alcohol Screen: Low Risk  (05/26/2022)  Depression (PHQ2-9): Low Risk  (05/26/2022)  Financial Resource Strain: Low Risk  (09/08/2023)   [facility]  Physical Activity: Insufficiently Active (09/08/2023)   Received from [facility]  Social Connections: Moderately Isolated (10/31/2024)  Tobacco Use: Medium Risk (10/31/2024)  Health Literacy: Adequate Health Literacy (09/08/2023)   Received from [facility]    Readmission Risk Interventions     No data to display

## 2024-11-03 NOTE — Plan of Care (Signed)

## 2024-11-03 NOTE — Progress Notes (Signed)
 PROGRESS NOTE        PATIENT DETAILS Name: Ana Ryan Age: 68 y.o. Sex: female Date of Birth: [birth year] Admit Date: 10/30/2024 Admitting Physician Eva KATHEE Pore, DO ERE:Hpoazmu, Charlie CROME, MD  Brief Summary: Patient is a 68 y.o.  female with history of asthma, CKD 4, HTN-who presented with almost 1 month history of gradually worsening generalized weakness, exertional dyspnea, coughing spells-she was found to have possible bronchopneumonia, worsening anemia and subsequently admitted to the hospitalist service.  Significant events: 11/12>> admit to [facility]  Significant studies: 11/12>> CT chest: Mild bronchiectasis in bilateral lungs-right> left, scattered small solid pulmonary nodules.   11/13>> CT abdomen/pelvis: Hepatic steatosis, multiple indeterminate renal masses. 11/14>> echo: EF 60-65%  Significant microbiology data: 11/12>> urine culture: E. coli.  Procedures: None  Consults: None   Subjective: Patient in bed, appears comfortable, denies any headache, no fever, no chest pain or pressure, no shortness of breath , no abdominal pain. No focal weakness.  Objective: Vitals: Blood pressure 119/72, pulse 78, temperature 98.3 F (36.8 C), temperature source Oral, resp. rate 14, height 5' 2 (1.575 m), weight 75.2 kg, SpO2 95%.   Exam:  Awake Alert, No new F.N deficits, Normal affect [place].AT,PERRAL Supple Neck, No JVD,   Symmetrical Chest wall movement, Good air movement bilaterally, CTAB RRR,No Gallops, Rubs or new Murmurs,  +ve B.Sounds, Abd Soft, No tenderness,   No Cyanosis, Clubbing or edema   Assessment/Plan: Generalized weakness Ongoing for at least a month if not longer-Complains of exertional dyspnea as well Suspect this is due to worsening anemia in the setting of CKD 4-with some contributions from PNA.  Although her TSH is slightly elevated-suspect her level of weakness/dyspnea is disproportionate to her level of mildly  elevated TSH. Echo is stable-no signs of CHF Continue treatment Etiologies-PNA being treated with antibiotics-getting 1 unit of PRBC this morning-on Aranesp/iron therapies as well. Continue to mobilize with nursing staff/PT/OT.  Bronchopneumonia/bronchiectasis Prior history of asthma. Reportedly-several years ago was on Advair-recently started on albuterol  inhaler by her PCP due to persistent coughing spells/exertional dyspnea.  Not formally diagnosed with asthma in the past. Currently not wheezing Continue empiric antibiotics Continue Mucinex Continue bronchodilators Incentive spirometry/flutter valve Will need outpatient follow-up for formal PFTs  Asymptomatic bacteriuria Urine cultures positive but-she has no dysuria/polyuria Doubt UTI-suspect this is more asymptomatic bacteriuria Irrespective-already on IV Rocephin .  Anemia No evidence of blood loss-suspect this is related to CKD 4-although this is macrocytic (macrocytosis could be related to EtOH use-as B12/folic acid levels are stable). Discussed with pharmacy-now on weekly Aranesp/iron therapy For received 1 unit of packed RBC on 11/02/2024, posttransfusion CBC is stable Follow CBC.  CKD 4 Creatinine close to baseline Follows with Dr. [place] an outpatient.  Hyponatremia Mild-appears to be a chronic issue Slowly improving-doubt is a workup Will follow for now.  Borderline hypotension Unclear etiology-does not appear to be septic-no evidence of volume/blood loss.  Random cortisol stable Check echo but no overt signs of CHF Continue low-dose midodrine and follow-thankfully she is asymptomatic.  Transaminitis Mild Acute hepatitis serology negative CT imaging shows significant hepatic steatosis For now follow-but suspect will need GI evaluation but this can be done as an outpatient.  Subclinical hypothyroidism TSH mildly elevated but free T4 normal Doubt her symptoms are from hypothyroidism-suspect anemia playing  more of a big role Hold off on starting Synthroid  Repeat TSH/free T4 as an outpatient in 4 to 6 weeks.  Right leg swelling Awaiting Doppler-ordered 11/14-not yet done-  Per patient-this is a chronic issue.  EtOH use Recently relapsed (has quit and relapsed multiple times in the past) Drinks vodka/orange juice-3 large drinks every day-1 bottle of vodka (750 mL) last for 3 days. Claims last drink was this past Monday-11/10-watch for signs of withdrawal-currently none.,  No signs of DTs, strictly counseled to abstain from alcohol abuse.  Multiple renal masses Incidental finding on CT abdomen Await MRI kidneys prior to discharge (per patient request)  Code status:   Code Status: Full Code   DVT Prophylaxis: SCDs Start: 10/30/24 2357   Family Communication: Patient had her daughter [name] on the phone while I was in the room rounding on her on 11/03/2024   Disposition Plan: Status is: Inpatient Remains inpatient appropriate because: Severity of illness   Planned Discharge Destination:Home   Diet: Diet Order             Diet regular Room service appropriate? Yes; Fluid consistency: Thin  Diet effective now                     Antimicrobial agents: Anti-infectives (From admission, onward)    Start     Dose/Rate Route Frequency Ordered Stop   10/31/24 1800  cefTRIAXone  (ROCEPHIN ) 1 g in sodium chloride  0.9 % 100 mL IVPB        1 g 200 mL/hr over 30 Minutes Intravenous Every 24 hours 10/31/24 0001 11/03/24 2359   10/31/24 1030  azithromycin  (ZITHROMAX ) tablet 500 mg        500 mg Oral Daily 10/31/24 1018 11/02/24 0912   10/30/24 2300  cefTRIAXone  (ROCEPHIN ) 1 g in sodium chloride  0.9 % 100 mL IVPB        1 g 200 mL/hr over 30 Minutes Intravenous  Once 10/30/24 2252 10/31/24 0007        MEDICATIONS: Scheduled Meds:  arformoterol  15 mcg Nebulization BID   budesonide (PULMICORT) nebulizer solution  0.25 mg Nebulization BID   darbepoetin (ARANESP) injection  - DIALYSIS  40 mcg Subcutaneous Q Fri-1800   esomeprazole   40 mg Oral Daily   ferrous sulfate  325 mg Oral Q breakfast   folic acid  1 mg Oral Daily   guaiFENesin  600 mg Oral BID   Influenza vac split trivalent PF  0.5 mL Intramuscular Tomorrow-1000   midodrine  2.5 mg Oral TID WC   multivitamin  1 tablet Oral Daily   thiamine  100 mg Oral Daily   Continuous Infusions:  cefTRIAXone  (ROCEPHIN )  IV 1 g (11/02/24 1754)   PRN Meds:.acetaminophen  **OR** acetaminophen , albuterol , benzonatate , melatonin, ondansetron  (ZOFRAN ) IV   I have personally reviewed following labs and imaging studies  LABORATORY DATA: CBC: Recent Labs  Lab 10/30/24 2246 10/31/24 0517 11/01/24 0258 11/02/24 0324 11/03/24 0508  WBC 6.4 4.9 4.8 5.3 5.8  NEUTROABS  --  3.3  --   --   --   HGB 8.3* 7.6* 7.0* 6.9* 8.3*  HCT 26.7* 24.5* 21.9* 21.4* 25.8*  MCV 102.7* 104.3* 101.9* 101.4* 97.4  PLT 215 204 227 231 212    Basic Metabolic Panel: Recent Labs  Lab 10/30/24 2022 10/30/24 2246 10/31/24 0517 11/01/24 0258 11/02/24 0324 11/03/24 0508  NA 128*  --  127* 129* 131* 132*  K 4.4  --  4.2 3.8 3.3* 4.1  CL 95*  --  97* 100 101 102  CO2 19*  --  16* 17* 18* 19*  GLUCOSE 117*  --  95 84 90 94  BUN 22  --  21 20 19 15   CREATININE 2.96*  --  2.83* 2.92* 2.82* 2.63*  CALCIUM  8.1*  --  8.0* 7.9* 7.8* 8.0*  MG  --  1.9 1.7  --   --   --     GFR: Estimated Creatinine Clearance: 19.2 mL/min (A) (by C-G formula based on SCr of 2.63 mg/dL (H)).  Liver Function Tests: Recent Labs  Lab 10/30/24 2022 10/31/24 0517 11/01/24 0258  AST 76* 61* 64*  ALT 70* 60* 53*  ALKPHOS 171* 140* 139*  BILITOT 2.2* 1.5* 1.1  PROT 4.5* 3.8* 3.9*  ALBUMIN 2.1* 1.9* 1.8*   Recent Labs  Lab 10/30/24 2022  LIPASE 31   No results for input(s): AMMONIA in the last 168 hours.  Coagulation Profile: Recent Labs  Lab 10/30/24 2003 10/31/24 0517  INR 1.1 1.0    Cardiac Enzymes: Recent Labs  Lab 10/31/24 0517   CKTOTAL 146    BNP (last 3 results) No results for input(s): PROBNP in the last 8760 hours.  Lipid Profile: No results for input(s): CHOL, HDL, LDLCALC, TRIG, CHOLHDL, LDLDIRECT in the last 72 hours.  Thyroid Function Tests: Recent Labs    10/31/24 1159  FREET4 0.80    Anemia Panel: No results for input(s): VITAMINB12, FOLATE, FERRITIN, TIBC, IRON, RETICCTPCT in the last 72 hours.   Urine analysis:    Component Value Date/Time   COLORURINE AMBER (A) 10/30/2024 2235   APPEARANCEUR CLOUDY (A) 10/30/2024 2235   LABSPEC 1.011 10/30/2024 2235   PHURINE 5.0 10/30/2024 2235   GLUCOSEU NEGATIVE 10/30/2024 2235   HGBUR NEGATIVE 10/30/2024 2235   BILIRUBINUR NEGATIVE 10/30/2024 2235   BILIRUBINUR negative 02/13/2019 1437   KETONESUR NEGATIVE 10/30/2024 2235   PROTEINUR NEGATIVE 10/30/2024 2235   UROBILINOGEN 0.2 02/13/2019 1437   UROBILINOGEN 0.2 09/08/2010 1135   NITRITE NEGATIVE 10/30/2024 2235   LEUKOCYTESUR LARGE (A) 10/30/2024 2235    Sepsis Labs: Lactic Acid, Venous    Component Value Date/Time   LATICACIDVEN 0.5 02/01/2019 1040    MICROBIOLOGY: Recent Results (from the past 240 hours)  Urine Culture     Status: Abnormal   Collection Time: 10/30/24 10:35 PM   Specimen: Urine, Random  Result Value Ref Range Status   Specimen Description URINE, RANDOM  Final   Special Requests   Final    NONE Reflexed from [id number] Performed at [facility], [address] [place], [place] [address]    Culture >=100,000 COLONIES/mL ESCHERICHIA COLI (A)  Final   Report Status 11/02/2024 FINAL  Final   Organism ID, Bacteria ESCHERICHIA COLI (A)  Final      Susceptibility   Escherichia coli - MIC*    AMPICILLIN >=32 RESISTANT Resistant     CEFAZOLIN  (URINE) Value in next row Resistant      >=32 RESISTANTThis is a modified FDA-approved test that has been validated and its performance characteristics determined by the reporting laboratory.  This  laboratory is certified under the Clinical Laboratory Improvement Amendments CLIA as qualified to perform high complexity clinical laboratory testing.    CEFEPIME Value in next row Sensitive      >=32 RESISTANTThis is a modified FDA-approved test that has been validated and its performance characteristics determined by the reporting laboratory.  This laboratory is certified under the Clinical Laboratory Improvement Amendments CLIA as qualified to perform high complexity clinical  laboratory testing.    ERTAPENEM Value in next row Sensitive      >=32 RESISTANTThis is a modified FDA-approved test that has been validated and its performance characteristics determined by the reporting laboratory.  This laboratory is certified under the Clinical Laboratory Improvement Amendments CLIA as qualified to perform high complexity clinical laboratory testing.    CEFTRIAXONE  Value in next row Sensitive      >=32 RESISTANTThis is a modified FDA-approved test that has been validated and its performance characteristics determined by the reporting laboratory.  This laboratory is certified under the Clinical Laboratory Improvement Amendments CLIA as qualified to perform high complexity clinical laboratory testing.    CIPROFLOXACIN Value in next row Sensitive      >=32 RESISTANTThis is a modified FDA-approved test that has been validated and its performance characteristics determined by the reporting laboratory.  This laboratory is certified under the Clinical Laboratory Improvement Amendments CLIA as qualified to perform high complexity clinical laboratory testing.    GENTAMICIN Value in next row Resistant      >=32 RESISTANTThis is a modified FDA-approved test that has been validated and its performance characteristics determined by the reporting laboratory.  This laboratory is certified under the Clinical Laboratory Improvement Amendments CLIA as qualified to perform high complexity clinical laboratory testing.     NITROFURANTOIN  Value in next row Sensitive      >=32 RESISTANTThis is a modified FDA-approved test that has been validated and its performance characteristics determined by the reporting laboratory.  This laboratory is certified under the Clinical Laboratory Improvement Amendments CLIA as qualified to perform high complexity clinical laboratory testing.    TRIMETH /SULFA  Value in next row Resistant      >=32 RESISTANTThis is a modified FDA-approved test that has been validated and its performance characteristics determined by the reporting laboratory.  This laboratory is certified under the Clinical Laboratory Improvement Amendments CLIA as qualified to perform high complexity clinical laboratory testing.    AMPICILLIN/SULBACTAM Value in next row Resistant      >=32 RESISTANTThis is a modified FDA-approved test that has been validated and its performance characteristics determined by the reporting laboratory.  This laboratory is certified under the Clinical Laboratory Improvement Amendments CLIA as qualified to perform high complexity clinical laboratory testing.    PIP/TAZO Value in next row Intermediate      64 INTERMEDIATEThis is a modified FDA-approved test that has been validated and its performance characteristics determined by the reporting laboratory.  This laboratory is certified under the Clinical Laboratory Improvement Amendments CLIA as qualified to perform high complexity clinical laboratory testing.    MEROPENEM Value in next row Sensitive      64 INTERMEDIATEThis is a modified FDA-approved test that has been validated and its performance characteristics determined by the reporting laboratory.  This laboratory is certified under the Clinical Laboratory Improvement Amendments CLIA as qualified to perform high complexity clinical laboratory testing.    * >=100,000 COLONIES/mL ESCHERICHIA COLI    RADIOLOGY STUDIES/RESULTS: ECHOCARDIOGRAM COMPLETE Result Date: 11/01/2024    ECHOCARDIOGRAM  REPORT   Patient Name:   ANGLEA GORDNER Date of Exam: 11/01/2024 Medical Rec #:  [id number]      Height:       62.0 in Accession #:    [id number]     Weight:       155.9 lb Date of Birth:  1956/07/24     BSA:          1.719 m Patient Age:    37  years       BP:           96/65 mmHg Patient Gender: F              HR:           98 bpm. Exam Location:  Inpatient Procedure: 2D Echo, Cardiac Doppler and Color Doppler (Both Spectral and Color            Flow Doppler were utilized during procedure). Indications:    Dyspnea  History:        Patient has no prior history of Echocardiogram examinations.                 Risk Factors:Hypertension and Dyslipidemia.  Sonographer:    Meagan Baucom RDCS, FE, PE Referring Phys: DONALDA HERO [facility] IMPRESSIONS  1. Left ventricular ejection fraction, by estimation, is 60 to 65%. The left ventricle has normal function. The left ventricle has no regional wall motion abnormalities. Indeterminate diastolic filling due to E-A fusion.  2. Right ventricular systolic function is normal. The right ventricular size is normal.  3. The mitral valve is normal in structure. No evidence of mitral valve regurgitation. No evidence of mitral stenosis.  4. The aortic valve is normal in structure. Aortic valve regurgitation is not visualized. No aortic stenosis is present.  5. The inferior vena cava is normal in size with greater than 50% respiratory variability, suggesting right atrial pressure of 3 mmHg. Comparison(s): No prior Echocardiogram. FINDINGS  Left Ventricle: Left ventricular ejection fraction, by estimation, is 60 to 65%. The left ventricle has normal function. The left ventricle has no regional wall motion abnormalities. The left ventricular internal cavity size was normal in size. There is  no left ventricular hypertrophy. Indeterminate diastolic filling due to E-A fusion. Right Ventricle: The right ventricular size is normal. No increase in right ventricular wall thickness. Right ventricular  systolic function is normal. Left Atrium: Left atrial size was normal in size. Right Atrium: Right atrial size was normal in size. Pericardium: There is no evidence of pericardial effusion. Presence of epicardial fat layer. Mitral Valve: The mitral valve is normal in structure. No evidence of mitral valve regurgitation. No evidence of mitral valve stenosis. Tricuspid Valve: The tricuspid valve is normal in structure. Tricuspid valve regurgitation is not demonstrated. No evidence of tricuspid stenosis. Aortic Valve: The aortic valve is normal in structure. Aortic valve regurgitation is not visualized. No aortic stenosis is present. Pulmonic Valve: The pulmonic valve was normal in structure. Pulmonic valve regurgitation is not visualized. No evidence of pulmonic stenosis. Aorta: The aortic root is normal in size and structure. Venous: The inferior vena cava is normal in size with greater than 50% respiratory variability, suggesting right atrial pressure of 3 mmHg. IAS/Shunts: No atrial level shunt detected by color flow Doppler.  LEFT VENTRICLE PLAX 2D LVIDd:         3.70 cm   Diastology LVIDs:         2.80 cm   LV e' medial:    7.51 cm/s LV PW:         1.00 cm   LV E/e' medial:  6.8 LV IVS:        1.10 cm   LV e' lateral:   9.14 cm/s LVOT diam:     2.20 cm   LV E/e' lateral: 5.6 LV SV:         78 LV SV Index:   46 LVOT Area:  3.80 cm  RIGHT VENTRICLE RV Basal diam:  2.90 cm RV Mid diam:    2.30 cm RV S prime:     26.60 cm/s TAPSE (M-mode): 1.3 cm LEFT ATRIUM             Index        RIGHT ATRIUM           Index LA Vol (A2C):   35.9 ml 20.88 ml/m  RA Area:     10.20 cm LA Vol (A4C):   41.2 ml 23.96 ml/m  RA Volume:   18.90 ml  10.99 ml/m LA Biplane Vol: 38.6 ml 22.45 ml/m  AORTIC VALVE LVOT Vmax:   150.00 cm/s LVOT Vmean:  88.800 cm/s LVOT VTI:    0.206 m  AORTA Ao Root diam: 3.60 cm Ao Asc diam:  3.40 cm MITRAL VALVE               TRICUSPID VALVE MV Area (PHT): 4.57 cm    TR Peak grad:   4.3 mmHg MV Decel  Time: 166 msec    TR Vmax:        104.00 cm/s MV E velocity: 50.80 cm/s MV A velocity: 80.70 cm/s  SHUNTS MV E/A ratio:  0.63        Systemic VTI:  0.21 m                            Systemic Diam: 2.20 cm Emeline Calender Electronically signed by Emeline Calender Signature Date/Time: 11/01/2024/6:02:12 PM    Final      LOS: 3 days   Lavada Stank, MD  [facility]    To contact the attending provider between 7A-7P or the covering provider during after hours 7P-7A, please log into the web site [email or web address] and access using universal [place] password for that web site. If you do not have the password, please call the hospital operator.  11/03/2024, 9:53 AM

## 2024-11-03 NOTE — Progress Notes (Signed)
 VASCULAR LAB    Right lower extremity venous duplex has been performed.  See CV proc for preliminary results.   Aniyiah Zell, RVT 11/03/2024, 3:34 PM

## 2024-11-04 DIAGNOSIS — R531 Weakness: Secondary | ICD-10-CM | POA: Diagnosis not present

## 2024-11-04 LAB — CBC WITH DIFFERENTIAL/PLATELET
Abs Immature Granulocytes: 0.32 K/uL — ABNORMAL HIGH (ref 0.00–0.07)
Basophils Absolute: 0.1 K/uL (ref 0.0–0.1)
Basophils Relative: 2 %
Eosinophils Absolute: 0.3 K/uL (ref 0.0–0.5)
Eosinophils Relative: 7 %
HCT: 25.7 % — ABNORMAL LOW (ref 36.0–46.0)
Hemoglobin: 8.3 g/dL — ABNORMAL LOW (ref 12.0–15.0)
Immature Granulocytes: 6 %
Lymphocytes Relative: 17 %
Lymphs Abs: 0.9 K/uL (ref 0.7–4.0)
MCH: 31.8 pg (ref 26.0–34.0)
MCHC: 32.3 g/dL (ref 30.0–36.0)
MCV: 98.5 fL (ref 80.0–100.0)
Monocytes Absolute: 0.9 K/uL (ref 0.1–1.0)
Monocytes Relative: 17 %
Neutro Abs: 2.7 K/uL (ref 1.7–7.7)
Neutrophils Relative %: 51 %
Platelets: 209 K/uL (ref 150–400)
RBC: 2.61 MIL/uL — ABNORMAL LOW (ref 3.87–5.11)
RDW: 17.9 % — ABNORMAL HIGH (ref 11.5–15.5)
WBC: 5.2 K/uL (ref 4.0–10.5)
nRBC: 2.3 % — ABNORMAL HIGH (ref 0.0–0.2)

## 2024-11-04 LAB — BASIC METABOLIC PANEL WITH GFR
Anion gap: 11 (ref 5–15)
BUN: 15 mg/dL (ref 8–23)
CO2: 19 mmol/L — ABNORMAL LOW (ref 22–32)
Calcium: 8.1 mg/dL — ABNORMAL LOW (ref 8.9–10.3)
Chloride: 104 mmol/L (ref 98–111)
Creatinine, Ser: 2.56 mg/dL — ABNORMAL HIGH (ref 0.44–1.00)
GFR, Estimated: 20 mL/min — ABNORMAL LOW (ref >60)
Glucose, Bld: 95 mg/dL (ref 70–99)
Potassium: 3.7 mmol/L (ref 3.5–5.1)
Sodium: 134 mmol/L — ABNORMAL LOW (ref 135–145)

## 2024-11-04 LAB — MAGNESIUM: Magnesium: 1.8 mg/dL (ref 1.7–2.4)

## 2024-11-04 NOTE — Progress Notes (Signed)
 PROGRESS NOTE        PATIENT DETAILS Name: Ana Ryan Age: 68 y.o. Sex: female Date of Birth: March 17, 1956 Admit Date: 10/30/2024 Admitting Physician Eva KATHEE Pore, DO ERE:Hpoazmu, Charlie CROME, MD  Brief Summary: Patient is a 68 y.o.  female with history of asthma, CKD 4, HTN-who presented with almost 1 month history of gradually worsening generalized weakness, exertional dyspnea, coughing spells-she was found to have possible bronchopneumonia, worsening anemia and subsequently admitted to the hospitalist service.  Significant events: 11/12>> admit to [facility]  Significant studies: 11/12>> CT chest: Mild bronchiectasis in bilateral lungs-right> left, scattered small solid pulmonary nodules.   11/13>> CT abdomen/pelvis: Hepatic steatosis, multiple indeterminate renal masses. 11/14>> echo: EF 60-65%  Significant microbiology data: 11/12>> urine culture: E. coli.  Procedures: None  Consults: None   Subjective:  Patient in bed, appears comfortable, denies any headache, no fever, no chest pain or pressure, no shortness of breath , no abdominal pain. No focal weakness.  Objective: Vitals: Blood pressure 118/78, pulse 77, temperature 98.4 F (36.9 C), temperature source Oral, resp. rate 18, height 5' 2 (1.575 m), weight 75.2 kg, SpO2 95%.   Exam:  Awake Alert, No new F.N deficits, Normal affect [place].AT,PERRAL Supple Neck, No JVD,   Symmetrical Chest wall movement, Good air movement bilaterally, CTAB RRR,No Gallops, Rubs or new Murmurs,  +ve B.Sounds, Abd Soft, No tenderness,   No Cyanosis, Clubbing or edema   Assessment/Plan: Generalized weakness Ongoing for at least a month if not longer-Complains of exertional dyspnea as well Suspect this is due to worsening anemia in the setting of CKD 4-with some contributions from PNA.  Although her TSH is slightly elevated-suspect her level of weakness/dyspnea is disproportionate to her level of mildly  elevated TSH. Echo is stable-no signs of CHF Continue treatment Etiologies-PNA being treated with antibiotics-getting 1 unit of PRBC this morning-on Aranesp/iron therapies as well. Continue to mobilize with nursing staff/PT/OT.  Bronchopneumonia/bronchiectasis Prior history of asthma. Reportedly-several years ago was on Advair-recently started on albuterol  inhaler by her PCP due to persistent coughing spells/exertional dyspnea.  Not formally diagnosed with asthma in the past. Currently not wheezing Continue empiric antibiotics Continue Mucinex Continue bronchodilators Incentive spirometry/flutter valve Will need outpatient follow-up for formal PFTs  Asymptomatic bacteriuria Urine cultures positive but-she has no dysuria/polyuria Doubt UTI-suspect this is more asymptomatic bacteriuria Irrespective-already on IV Rocephin .  Anemia No evidence of blood loss-suspect this is related to CKD 4-although this is macrocytic (macrocytosis could be related to EtOH use-as B12/folic acid levels are stable). Discussed with pharmacy-now on weekly Aranesp/iron therapy For received 1 unit of packed RBC on 11/02/2024, posttransfusion CBC is stable Follow CBC.  CKD 4 Creatinine close to baseline Follows with Dr. [place] an outpatient.  Will follow-up with her in 1 to 2 weeks to follow-up on MRI results and possible repeat MRI in 5 to 6 months.  Hyponatremia Mild-appears to be a chronic issue Slowly improving-doubt is a workup Will follow for now.  Borderline hypotension Unclear etiology-does not appear to be septic-no evidence of volume/blood loss.  Random cortisol stable Check echo but no overt signs of CHF Continue low-dose midodrine and follow-thankfully she is asymptomatic.  Transaminitis Mild Acute hepatitis serology negative CT imaging shows significant hepatic steatosis For now follow-but suspect will need GI evaluation but this can be done as an outpatient.  Subclinical  hypothyroidism  TSH mildly elevated but free T4 normal Doubt her symptoms are from hypothyroidism-suspect anemia playing more of a big role Hold off on starting Synthroid Repeat TSH/free T4 as an outpatient in 4 to 6 weeks.  Right leg swelling Chronic, ultrasound negative  EtOH use Recently relapsed (has quit and relapsed multiple times in the past) Drinks vodka/orange juice-3 large drinks every day-1 bottle of vodka (750 mL) last for 3 days. Claims last drink was this past Monday-11/10-watch for signs of withdrawal-currently none.,  No signs of DTs, strictly counseled to abstain from alcohol abuse.  Multiple renal masses Incidental finding on CT abdomen MRI noted, largely inconclusive, outpatient follow-up with nephrology, PCP and repeat MRI in 8-6 months to be arranged by PCP  Code status:   Code Status: Full Code   DVT Prophylaxis: SCDs Start: 10/30/24 2357   Family Communication: Patient had her daughter [name] on the phone while I was in the room rounding on her on 11/03/2024, daughter bedside on 11/04/2024   Disposition Plan: Status is: Inpatient Remains inpatient appropriate because: Severity of illness   Planned Discharge Destination:Home   Diet: Diet Order             Diet regular Room service appropriate? Yes; Fluid consistency: Thin  Diet effective now                   MEDICATIONS: Scheduled Meds:  arformoterol  15 mcg Nebulization BID   budesonide (PULMICORT) nebulizer solution  0.25 mg Nebulization BID   darbepoetin (ARANESP) injection - DIALYSIS  40 mcg Subcutaneous Q Fri-1800   esomeprazole   40 mg Oral Daily   ferrous sulfate  325 mg Oral Q breakfast   folic acid  1 mg Oral Daily   guaiFENesin  600 mg Oral BID   Influenza vac split trivalent PF  0.5 mL Intramuscular Tomorrow-1000   midodrine  2.5 mg Oral TID WC   multivitamin  1 tablet Oral Daily   thiamine  100 mg Oral Daily   Continuous Infusions:   PRN Meds:.acetaminophen  **OR**  acetaminophen , albuterol , benzonatate , melatonin, ondansetron  (ZOFRAN ) IV   I have personally reviewed following labs and imaging studies  LABORATORY DATA: CBC: Recent Labs  Lab 10/31/24 0517 11/01/24 0258 11/02/24 0324 11/03/24 0508 11/04/24 0338  WBC 4.9 4.8 5.3 5.8 5.2  NEUTROABS 3.3  --   --   --  2.7  HGB 7.6* 7.0* 6.9* 8.3* 8.3*  HCT 24.5* 21.9* 21.4* 25.8* 25.7*  MCV 104.3* 101.9* 101.4* 97.4 98.5  PLT 204 227 231 212 209    Basic Metabolic Panel: Recent Labs  Lab 10/30/24 2246 10/31/24 0517 11/01/24 0258 11/02/24 0324 11/03/24 0508 11/04/24 0338  NA  --  127* 129* 131* 132* 134*  K  --  4.2 3.8 3.3* 4.1 3.7  CL  --  97* 100 101 102 104  CO2  --  16* 17* 18* 19* 19*  GLUCOSE  --  95 84 90 94 95  BUN  --  21 20 19 15 15   CREATININE  --  2.83* 2.92* 2.82* 2.63* 2.56*  CALCIUM   --  8.0* 7.9* 7.8* 8.0* 8.1*  MG 1.9 1.7  --   --   --  1.8    GFR: Estimated Creatinine Clearance: 19.7 mL/min (A) (by C-G formula based on SCr of 2.56 mg/dL (H)).  Liver Function Tests: Recent Labs  Lab 10/30/24 2022 10/31/24 0517 11/01/24 0258  AST 76* 61* 64*  ALT 70* 60* 53*  ALKPHOS 171*  140* 139*  BILITOT 2.2* 1.5* 1.1  PROT 4.5* 3.8* 3.9*  ALBUMIN 2.1* 1.9* 1.8*   Recent Labs  Lab 10/30/24 2022  LIPASE 31   No results for input(s): AMMONIA in the last 168 hours.  Coagulation Profile: Recent Labs  Lab 10/30/24 2003 10/31/24 0517  INR 1.1 1.0    Cardiac Enzymes: Recent Labs  Lab 10/31/24 0517  CKTOTAL 146    BNP (last 3 results) No results for input(s): PROBNP in the last 8760 hours.  Lipid Profile: No results for input(s): CHOL, HDL, LDLCALC, TRIG, CHOLHDL, LDLDIRECT in the last 72 hours.  Thyroid Function Tests: No results for input(s): TSH, T4TOTAL, FREET4, T3FREE, THYROIDAB in the last 72 hours.  RADIOLOGY STUDIES/RESULTS: VAS US  LOWER EXTREMITY VENOUS (DVT) Result Date: 11/03/2024  Lower Venous DVT Study Patient  Name:  YASENIA REEDY  Date of Exam:   11/03/2024 Medical Rec #: [id number]       Accession #:    [id number] Date of Birth: 26-May-1956      Patient Gender: F Patient Age:   42 years Exam Location:  [facility] Procedure:      VAS US  LOWER EXTREMITY VENOUS (DVT) Referring Phys: DONALDA APPLEBAUM --------------------------------------------------------------------------------  Indications: Right lower extremity swelling.  Comparison Study: No prior study on file Performing Technologist: Alberta Lis RVS  Examination Guidelines: A complete evaluation includes B-mode imaging, spectral Doppler, color Doppler, and power Doppler as needed of all accessible portions of each vessel. Bilateral testing is considered an integral part of a complete examination. Limited examinations for reoccurring indications may be performed as noted. The reflux portion of the exam is performed with the patient in reverse Trendelenburg.  +---------+---------------+---------+-----------+----------+--------------+ RIGHT    CompressibilityPhasicitySpontaneityPropertiesThrombus Aging +---------+---------------+---------+-----------+----------+--------------+ CFV      Full           Yes      Yes                                 +---------+---------------+---------+-----------+----------+--------------+ SFJ      Full                                                        +---------+---------------+---------+-----------+----------+--------------+ FV Prox  Full           Yes      Yes                                 +---------+---------------+---------+-----------+----------+--------------+ FV Mid   Full                                                        +---------+---------------+---------+-----------+----------+--------------+ FV DistalFull           Yes      Yes                                 +---------+---------------+---------+-----------+----------+--------------+ PFV      Full  Yes       Yes                                 +---------+---------------+---------+-----------+----------+--------------+ POP      Full           Yes      Yes                                 +---------+---------------+---------+-----------+----------+--------------+ PTV      Full                                                        +---------+---------------+---------+-----------+----------+--------------+ PERO     Full                                                        +---------+---------------+---------+-----------+----------+--------------+ Gastroc  Full                                                        +---------+---------------+---------+-----------+----------+--------------+   +----+---------------+---------+-----------+----------+--------------+ LEFTCompressibilityPhasicitySpontaneityPropertiesThrombus Aging +----+---------------+---------+-----------+----------+--------------+ CFV Full           Yes      Yes                                 +----+---------------+---------+-----------+----------+--------------+ SFJ Full                                                        +----+---------------+---------+-----------+----------+--------------+    Summary: RIGHT: - There is no evidence of deep vein thrombosis in the lower extremity.  - No cystic structure found in the popliteal fossa.  LEFT: - No evidence of common femoral vein obstruction.   *See table(s) above for measurements and observations.    Preliminary    MR ABDOMEN W WO CONTRAST Result Date: 11/03/2024 CLINICAL DATA:  New indeterminate bilateral renal masses CT. EXAM: MRI ABDOMEN WITHOUT AND WITH CONTRAST TECHNIQUE: Multiplanar multisequence MR imaging of the abdomen was performed both before and after the administration of intravenous contrast. CONTRAST:  7.5mL GADAVIST GADOBUTROL 1 MMOL/ML IV SOLN COMPARISON:  CT 10/31/2024 and 05/29/2019. Renal ultrasound 02/01/2019. FINDINGS: Technical  note: Despite efforts by the technologist and patient, mild to moderate motion artifact is present on today's exam and could not be eliminated. This reduces exam sensitivity and specificity. Motion is greatest on the postcontrast images, limiting the subtracted images. Lower chest: Scarring and bronchiectasis noted at both lung bases possible superimposed atelectasis at the right lung base. There trace pleural effusions. Hepatobiliary: As seen on CT, the liver is enlarged with severe diffuse steatosis. No focal liver lesions are identified. No evidence of  gallstones, gallbladder wall thickening or biliary dilatation. Pancreas: Unremarkable. No pancreatic ductal dilatation or surrounding inflammatory changes. Spleen: Normal in size without focal abnormality. Adrenals/Urinary Tract: Both adrenal glands appear normal. Compared with the 2020 CT, there is increasing renal cortical thinning and scarring bilaterally. No evidence of hydronephrosis. As seen recent CT there are exophytic renal lesions bilaterally, including a 2.2 x 2.0 cm lesion projecting from the upper pole right kidney (image 29/3) and a 1.4 x 1.2 cm lesion projecting from the interpolar region of the left kidney (image 23/3). No definite focal lesion is seen within the lower pole the right kidney. Both of these lesions demonstrate similar signal and enhancement to adjacent normal renal parenchyma on all pulse sequences. There does not appear to be any restricted diffusion associated with these lesions which may reflect cortical lobules/pseudo lesions. Bladder not imaged. Stomach/Bowel: The stomach appears unremarkable for its degree of distension. No evidence of bowel wall thickening, distention or surrounding inflammatory change. Vascular/Lymphatic: There are no enlarged abdominal lymph nodes. No significant vascular findings. Other: Generalized subcutaneous edema. No ascites or focal intraperitoneal fluid collections identified. Musculoskeletal: No  acute or significant osseous findings. IMPRESSION: 1. The examination is limited by motion artifact. 2. Both kidneys demonstrate increasing renal cortical thinning and scarring compared with the 2020 CT. There are exophytic renal lesions bilaterally which demonstrate similar signal and enhancement to adjacent normal renal parenchyma on all pulse sequences. These may reflect cortical lobules/pseudo lesions. No definite focal lesion is seen within the lower pole of the right kidney. Given the motion limitations of this examination, recommend follow-up CT or MRI in 6 months to assess stability. 3. Hepatomegaly with severe diffuse hepatic steatosis. 4. Scarring and bronchiectasis at both lung bases with possible superimposed atelectasis at the right lung base. Electronically Signed   By: Elsie Perone M.D.   On: 11/03/2024 10:18     LOS: 4 days   Lavada Stank, MD  [facility]  To contact the attending provider between 7A-7P or the covering provider during after hours 7P-7A, please log into the web site [email or web address] and access using universal [place] password for that web site. If you do not have the password, please call the hospital operator.  11/04/2024, 7:36 AM

## 2024-11-04 NOTE — Plan of Care (Signed)

## 2024-11-04 NOTE — Progress Notes (Signed)
 Occupational Therapy Treatment Patient Details Name: Ana Ryan MRN: [id number] DOB: 1956-06-13 Today's Date: 11/04/2024   History of present illness 68 y/o F presenting to ED on 11/12 with SOB x3 weeks, cough, diarrhea, admitted for generalized weakness.    PMH includes alcohol use, HTN, ARF, GERD, CKD IV   OT comments  Patient with good gains toward patient focused goals.  She is essentially Mod I to Independent with ADL completion at a sit to stand level, stand grooming, and in room mobility/toileting without an AD.  Patient is still feeling mildly weak, and does have DOE, but not needing any physical assist.  OT frequency dropped to 1x/wk, supposedly the husband wants her to go to AIR here at the hospital.  OT cannot recommend that level of rehab, patient expressing the need for increased endurance and ETOH counseling.  No HH OT is recommended.  Will defer to PT for post acute rehab for mobility.         If plan is discharge home, recommend the following:  Assist for transportation   Equipment Recommendations  None recommended by OT    Recommendations for Other Services      Precautions / Restrictions Precautions Precautions: Fall Restrictions Weight Bearing Restrictions Per Provider Order: No       Mobility Bed Mobility Overal bed mobility: Independent                  Transfers Overall transfer level: Modified independent Equipment used: None                     Balance Overall balance assessment: Mild deficits observed, not formally tested                                         ADL either performed or assessed with clinical judgement   ADL Overall ADL's : Modified independent                                            Extremity/Trunk Assessment Upper Extremity Assessment Upper Extremity Assessment: Overall WFL for tasks assessed   Lower Extremity Assessment Lower Extremity Assessment: Defer to PT  evaluation   Cervical / Trunk Assessment Cervical / Trunk Assessment: Normal    Vision Baseline Vision/History: 1 Wears glasses Patient Visual Report: No change from baseline     Perception Perception Perception: Not tested   Praxis Praxis Praxis: Not tested   Communication Communication Communication: No apparent difficulties   Cognition Arousal: Alert Behavior During Therapy: WFL for tasks assessed/performed Cognition: No apparent impairments                               Following commands: Intact        Cueing   Cueing Techniques: Verbal cues  Exercises      Shoulder Instructions       General Comments      Pertinent Vitals/ Pain       Pain Assessment Pain Assessment: No/denies pain  Frequency  Min 1X/week        Progress Toward Goals  OT Goals(current goals can now be found in the care plan section)  Progress towards OT goals: Progressing toward goals  Acute Rehab OT Goals OT Goal Formulation: With patient Time For Goal Achievement: 11/14/24 Potential to Achieve Goals: Good  Plan      Co-evaluation                 AM-PAC OT 6 Clicks Daily Activity     Outcome Measure   Help from another person eating meals?: None Help from another person taking care of personal grooming?: None Help from another person toileting, which includes using toliet, bedpan, or urinal?: None Help from another person bathing (including washing, rinsing, drying)?: None Help from another person to put on and taking off regular upper body clothing?: None Help from another person to put on and taking off regular lower body clothing?: None 6 Click Score: 24    End of Session    OT Visit Diagnosis: Unsteadiness on feet (R26.81);Other abnormalities of gait and mobility (R26.89);Muscle weakness (generalized) (M62.81)   Activity Tolerance Patient tolerated treatment  well   Patient Left in bed;with call bell/phone within reach   Nurse Communication Mobility status        Time: 8964-8946 OT Time Calculation (min): 18 min  Charges: OT General Charges $OT Visit: 1 Visit OT Treatments $Self Care/Home Management : 8-22 mins  11/04/2024  RP, OTR/L  Acute Rehabilitation Services  Office:  [phone]   Ana Ryan 11/04/2024, 10:55 AM

## 2024-11-04 NOTE — Progress Notes (Signed)
 Physical Therapy Treatment Patient Details Name: Ana Ryan MRN: [id number] DOB: Oct 26, 1956 Today's Date: 11/04/2024   History of Present Illness 68 y/o F presenting to ED on 11/12 with SOB x3 weeks, cough, diarrhea, admitted for generalized weakness.    PMH includes alcohol use, HTN, ARF, GERD, CKD IV    PT Comments  Pt remains limited by fatigue compared to baseline, reporting DOE with ambulation despite stable SpO2. PT does note tachycardia up to 116 observed with ambulation. Pt is encouraged to mobilize more frequently in an effort to further improve endurance. PT will follow up for stair training next session as pt reports this is her greatest concern about discharging home. PT recommends HHPT at the time of discharge.    If plan is discharge home, recommend the following: A little help with walking and/or transfers;A little help with bathing/dressing/bathroom;Assistance with cooking/housework;Assist for transportation;Help with stairs or ramp for entrance   Can travel by private vehicle        Equipment Recommendations  None recommended by PT    Recommendations for Other Services       Precautions / Restrictions Precautions Precautions: Fall Recall of Precautions/Restrictions: Intact Restrictions Weight Bearing Restrictions Per Provider Order: No     Mobility  Bed Mobility Overal bed mobility: Independent                  Transfers Overall transfer level: Independent Equipment used: None Transfers: Sit to/from Stand Sit to Stand: Independent                Ambulation/Gait Ambulation/Gait assistance: Supervision Gait Distance (Feet): 150 Feet Assistive device: None Gait Pattern/deviations: Step-through pattern Gait velocity: reduced Gait velocity interpretation: 1.31 - 2.62 ft/sec, indicative of limited community ambulator   General Gait Details: slowed step-through gait, one brief standing rest break due to reports of DOE   Dealer Bed    Modified Rankin (Stroke Patients Only)       Balance Overall balance assessment: Needs assistance Sitting-balance support: No upper extremity supported, Feet supported Sitting balance-Leahy Scale: Good     Standing balance support: No upper extremity supported, During functional activity Standing balance-Leahy Scale: Good                              Communication Communication Communication: No apparent difficulties  Cognition Arousal: Alert Behavior During Therapy: WFL for tasks assessed/performed   PT - Cognitive impairments: No apparent impairments                         Following commands: Intact      Cueing Cueing Techniques: Verbal cues  Exercises      General Comments General comments (skin integrity, edema, etc.): pt on RA, SpO2 96% and higher during activity. pulse rate at 116 during standing rest break. BP 127/76 pre-mobility and 112/72 in standing after ambulation      Pertinent Vitals/Pain Pain Assessment Pain Assessment: No/denies pain    Home Living                          Prior Function            PT Goals (current goals can now be found in the care plan section) Acute Rehab PT Goals Patient Stated  Goal: to go home Progress towards PT goals: Progressing toward goals    Frequency    Min 2X/week      PT Plan      Co-evaluation              AM-PAC PT 6 Clicks Mobility   Outcome Measure  Help needed turning from your back to your side while in a flat bed without using bedrails?: A Little Help needed moving from lying on your back to sitting on the side of a flat bed without using bedrails?: A Little Help needed moving to and from a bed to a chair (including a wheelchair)?: A Little Help needed standing up from a chair using your arms (e.g., wheelchair or bedside chair)?: A Little Help needed to walk in hospital room?: A Little Help needed  climbing 3-5 steps with a railing? : A Lot 6 Click Score: 17    End of Session Equipment Utilized During Treatment: Gait belt Activity Tolerance: Patient limited by fatigue Patient left: in bed;with call bell/phone within reach Nurse Communication: Mobility status PT Visit Diagnosis: Unsteadiness on feet (R26.81);Muscle weakness (generalized) (M62.81)     Time: 8544-8478 PT Time Calculation (min) (ACUTE ONLY): 26 min  Charges:    $Gait Training: 8-22 mins $Therapeutic Activity: 8-22 mins PT General Charges $$ ACUTE PT VISIT: 1 Visit                     Bernardino JINNY Ruth, PT, DPT Acute Rehabilitation Office [phone]    Bernardino JINNY Ruth 11/04/2024, 3:35 PM

## 2024-11-05 DIAGNOSIS — R531 Weakness: Secondary | ICD-10-CM | POA: Diagnosis not present

## 2024-11-05 MED ORDER — ONDANSETRON 4 MG PO TBDP: 4.0000 mg | ORAL_TABLET | Freq: Four times a day (QID) | ORAL | Status: DC | PRN

## 2024-11-05 MED ORDER — ENSURE PLUS HIGH PROTEIN PO LIQD
237.0000 mL | Freq: Two times a day (BID) | ORAL | Status: DC
  Administered 2024-11-05 – 2024-11-06 (×2): 237 mL via ORAL

## 2024-11-05 MED ORDER — ONDANSETRON HCL 4 MG PO TABS: 4.0000 mg | ORAL_TABLET | Freq: Four times a day (QID) | ORAL | Status: DC | PRN

## 2024-11-05 MED ORDER — ONDANSETRON HCL 4 MG/2ML IJ SOLN
4.0000 mg | Freq: Four times a day (QID) | INTRAMUSCULAR | Status: DC | PRN
  Administered 2024-11-06: 4 mg via INTRAVENOUS
  Filled 2024-11-05: qty 2

## 2024-11-05 MED ORDER — LOPERAMIDE HCL 2 MG PO CAPS
4.0000 mg | ORAL_CAPSULE | Freq: Four times a day (QID) | ORAL | Status: DC | PRN
  Administered 2024-11-05: 4 mg via ORAL
  Filled 2024-11-05: qty 2

## 2024-11-05 NOTE — Progress Notes (Signed)
 Physical Therapy Treatment Patient Details Name: Ana Ryan MRN: [id number] DOB: 10-02-1956 Today's Date: 11/05/2024   History of Present Illness 68 y/o F presenting to ED on 11/12 with SOB x3 weeks, cough, diarrhea, admitted for generalized weakness.    PMH includes alcohol use, HTN, ARF, GERD, CKD IV    PT Comments  Pt tolerated treatment well today. Pt today was able to ambulate in hallway with no AD and navigate stairs at supervision level. No change in DC/DME recs at this time. PT will continue to follow.     If plan is discharge home, recommend the following: A little help with walking and/or transfers;A little help with bathing/dressing/bathroom;Assistance with cooking/housework;Assist for transportation;Help with stairs or ramp for entrance   Can travel by private vehicle        Equipment Recommendations  None recommended by PT    Recommendations for Other Services       Precautions / Restrictions Precautions Precautions: Fall Recall of Precautions/Restrictions: Intact Restrictions Weight Bearing Restrictions Per Provider Order: No     Mobility  Bed Mobility Overal bed mobility: Independent                  Transfers Overall transfer level: Independent Equipment used: None Transfers: Sit to/from Stand Sit to Stand: Independent                Ambulation/Gait Ambulation/Gait assistance: Supervision Gait Distance (Feet): 175 Feet Assistive device: None Gait Pattern/deviations: Step-through pattern Gait velocity: reduced     General Gait Details: slowed step-through gait, one brief standing rest break due to reports of DOE   Stairs Stairs: Yes Stairs assistance: Supervision Stair Management: Two rails, Alternating pattern, Forwards Number of Stairs: 4 General stair comments: no LOB noted.   Wheelchair Mobility     Tilt Bed    Modified Rankin (Stroke Patients Only)       Balance Overall balance assessment: Needs  assistance Sitting-balance support: No upper extremity supported, Feet supported Sitting balance-Leahy Scale: Good     Standing balance support: No upper extremity supported, During functional activity Standing balance-Leahy Scale: Good                              Communication Communication Communication: No apparent difficulties  Cognition Arousal: Alert Behavior During Therapy: WFL for tasks assessed/performed   PT - Cognitive impairments: No apparent impairments                         Following commands: Intact      Cueing Cueing Techniques: Verbal cues  Exercises      General Comments General comments (skin integrity, edema, etc.): Pt on RA. SpO2 97%.      Pertinent Vitals/Pain      Home Living                          Prior Function            PT Goals (current goals can now be found in the care plan section) Acute Rehab PT Goals Patient Stated Goal: to go home Progress towards PT goals: Progressing toward goals    Frequency    Min 2X/week      PT Plan      Co-evaluation              AM-PAC PT 6 Clicks Mobility   Outcome  Measure  Help needed turning from your back to your side while in a flat bed without using bedrails?: A Little Help needed moving from lying on your back to sitting on the side of a flat bed without using bedrails?: A Little Help needed moving to and from a bed to a chair (including a wheelchair)?: A Little Help needed standing up from a chair using your arms (e.g., wheelchair or bedside chair)?: A Little Help needed to walk in hospital room?: A Little Help needed climbing 3-5 steps with a railing? : A Lot 6 Click Score: 17    End of Session Equipment Utilized During Treatment: Gait belt Activity Tolerance: Patient limited by fatigue Patient left: in bed;with call bell/phone within reach Nurse Communication: Mobility status PT Visit Diagnosis: Unsteadiness on feet (R26.81);Muscle  weakness (generalized) (M62.81)     Time: 1010-1018 PT Time Calculation (min) (ACUTE ONLY): 8 min  Charges:    $Gait Training: 8-22 mins PT General Charges $$ ACUTE PT VISIT: 1 Visit                     Ana Ryan, PT, DPT Acute Rehab Services [phone]    Ana Ryan 11/05/2024, 3:58 PM

## 2024-11-05 NOTE — Progress Notes (Signed)
  Inpatient Rehabilitation Admissions Coordinator   Returned call from spouse who reached out to CIR yesterday requesting rehab assistance. I explained that she is ready for [facility]. He states he has worked our for her to go to [facility] in [place] which is a alcohol rehab program.  Heron Leavell, RN, MSN Rehab Admissions Coordinator [phone] 11/05/2024 5:04 PM

## 2024-11-05 NOTE — Plan of Care (Signed)

## 2024-11-05 NOTE — Progress Notes (Signed)
 PROGRESS NOTE        PATIENT DETAILS Name: Ana Ryan Age: 68 y.o. Sex: female Date of Birth: [birth year] Admit Date: 10/30/2024 Admitting Physician Eva KATHEE Pore, DO ERE:Hpoazmu, Charlie CROME, MD  Brief Summary: Patient is a 68 y.o.  female with history of asthma, CKD 4, HTN-who presented with almost 1 month history of gradually worsening generalized weakness, exertional dyspnea, coughing spells-she was found to have possible bronchopneumonia, worsening anemia and subsequently admitted to the hospitalist service.  Significant events: 11/12>> admit to [facility]  Significant studies: 11/12>> CT chest: Mild bronchiectasis in bilateral lungs-right> left, scattered small solid pulmonary nodules.   11/13>> CT abdomen/pelvis: Hepatic steatosis, multiple indeterminate renal masses. 11/14>> echo: EF 60-65%  Significant microbiology data: 11/12>> urine culture: E. coli.  Procedures: None  Consults: None   Subjective: Patient in bed, appears comfortable, denies any headache, no fever, no chest pain or pressure, no shortness of breath , no abdominal pain. No focal weakness.  Mild diarrhea.  Objective: Vitals: Blood pressure (!) 144/86, pulse 86, temperature 97.9 F (36.6 C), temperature source Oral, resp. rate 16, height 5' 2 (1.575 m), weight 78.2 kg, SpO2 94%.   Exam:  Awake Alert, No new F.N deficits, Normal affect [place].AT,PERRAL Supple Neck, No JVD,   Symmetrical Chest wall movement, Good air movement bilaterally, CTAB RRR,No Gallops, Rubs or new Murmurs,  +ve B.Sounds, Abd Soft, No tenderness,   No Cyanosis, Clubbing or edema   Assessment/Plan:  Generalized weakness Ongoing for at least a month if not longer-Complains of exertional dyspnea as well Suspect this is due to worsening anemia in the setting of CKD 4-with some contributions from PNA.  Although her TSH is slightly elevated-suspect her level of weakness/dyspnea is disproportionate to her  level of mildly elevated TSH. Echo is stable-no signs of CHF Continue treatment Etiologies-PNA being treated with antibiotics-getting 1 unit of PRBC this morning-on Aranesp/iron therapies as well. Continue to mobilize with nursing staff/PT/OT.  Bronchopneumonia/bronchiectasis Prior history of asthma. Reportedly-several years ago was on Advair-recently started on albuterol  inhaler by her PCP due to persistent coughing spells/exertional dyspnea.  Not formally diagnosed with asthma in the past. Currently not wheezing Continue empiric antibiotics Continue Mucinex Continue bronchodilators Incentive spirometry/flutter valve Will need outpatient follow-up for formal PFTs  Asymptomatic bacteriuria Urine cultures positive but-she has no dysuria/polyuria Doubt UTI-suspect this is more asymptomatic bacteriuria Irrespective-already on IV Rocephin .  Anemia No evidence of blood loss-suspect this is related to CKD 4-although this is macrocytic (macrocytosis could be related to EtOH use-as B12/folic acid levels are stable). Discussed with pharmacy-now on weekly Aranesp/iron therapy For received 1 unit of packed RBC on 11/02/2024, posttransfusion CBC is stable Follow CBC.  CKD 4 Creatinine close to baseline Follows with Dr. [place] an outpatient.  Will follow-up with her in 1 to 2 weeks to follow-up on MRI results and possible repeat MRI in 5 to 6 months.  Hyponatremia Mild-appears to be a chronic issue Slowly improving-doubt is a workup Will follow for now.  Borderline hypotension Unclear etiology-does not appear to be septic-no evidence of volume/blood loss.  Random cortisol stable Check echo but no overt signs of CHF Continue low-dose midodrine and follow-thankfully she is asymptomatic.  Transaminitis Mild Acute hepatitis serology negative CT imaging shows significant hepatic steatosis For now follow-but suspect will need GI evaluation but this can be done as an  outpatient.  Subclinical hypothyroidism TSH mildly elevated but free T4 normal Doubt her symptoms are from hypothyroidism-suspect anemia playing more of a big role Hold off on starting Synthroid Repeat TSH/free T4 as an outpatient in 4 to 6 weeks.  Right leg swelling Chronic, ultrasound negative  EtOH use Recently relapsed (has quit and relapsed multiple times in the past) Drinks vodka/orange juice-3 large drinks every day-1 bottle of vodka (750 mL) last for 3 days. Claims last drink was this past Monday-11/10-watch for signs of withdrawal-currently none.,  No signs of DTs, strictly counseled to abstain from alcohol abuse.  Multiple renal masses Incidental finding on CT abdomen MRI noted, largely inconclusive, outpatient follow-up with nephrology, PCP and repeat MRI in 8-6 months to be arranged by PCP  Code status:   Code Status: Full Code   DVT Prophylaxis: SCDs Start: 10/30/24 2357   Family Communication: Patient had her daughter [name] on the phone while I was in the room rounding on her on 11/03/2024, daughter bedside on 11/04/2024, discussed with patient's husband and daughter bedside on 11/05/2024 they want her to stay for 1 more day they are looking for a alcohol rehab facility to check the patient in on 11/06/2024.   Disposition Plan: Status is: Inpatient Remains inpatient appropriate because: Severity of illness   Planned Discharge Destination:Home   Diet: Diet Order             Diet regular Room service appropriate? Yes; Fluid consistency: Thin  Diet effective now                   MEDICATIONS: Scheduled Meds:  arformoterol  15 mcg Nebulization BID   budesonide (PULMICORT) nebulizer solution  0.25 mg Nebulization BID   darbepoetin (ARANESP) injection - DIALYSIS  40 mcg Subcutaneous Q Fri-1800   esomeprazole   40 mg Oral Daily   ferrous sulfate  325 mg Oral Q breakfast   folic acid  1 mg Oral Daily   guaiFENesin  600 mg Oral BID   Influenza vac  split trivalent PF  0.5 mL Intramuscular Tomorrow-1000   midodrine  2.5 mg Oral TID WC   multivitamin  1 tablet Oral Daily   thiamine  100 mg Oral Daily   Continuous Infusions:   PRN Meds:.acetaminophen  **OR** acetaminophen , albuterol , benzonatate , loperamide, melatonin, ondansetron  **OR** ondansetron  (ZOFRAN ) IV **OR** ondansetron    I have personally reviewed following labs and imaging studies  LABORATORY DATA: CBC: Recent Labs  Lab 10/31/24 0517 11/01/24 0258 11/02/24 0324 11/03/24 0508 11/04/24 0338  WBC 4.9 4.8 5.3 5.8 5.2  NEUTROABS 3.3  --   --   --  2.7  HGB 7.6* 7.0* 6.9* 8.3* 8.3*  HCT 24.5* 21.9* 21.4* 25.8* 25.7*  MCV 104.3* 101.9* 101.4* 97.4 98.5  PLT 204 227 231 212 209    Basic Metabolic Panel: Recent Labs  Lab 10/30/24 2246 10/31/24 0517 11/01/24 0258 11/02/24 0324 11/03/24 0508 11/04/24 0338  NA  --  127* 129* 131* 132* 134*  K  --  4.2 3.8 3.3* 4.1 3.7  CL  --  97* 100 101 102 104  CO2  --  16* 17* 18* 19* 19*  GLUCOSE  --  95 84 90 94 95  BUN  --  21 20 19 15 15   CREATININE  --  2.83* 2.92* 2.82* 2.63* 2.56*  CALCIUM   --  8.0* 7.9* 7.8* 8.0* 8.1*  MG 1.9 1.7  --   --   --  1.8    GFR: Estimated  Creatinine Clearance: 20.1 mL/min (A) (by C-G formula based on SCr of 2.56 mg/dL (H)).  Liver Function Tests: Recent Labs  Lab 10/30/24 2022 10/31/24 0517 11/01/24 0258  AST 76* 61* 64*  ALT 70* 60* 53*  ALKPHOS 171* 140* 139*  BILITOT 2.2* 1.5* 1.1  PROT 4.5* 3.8* 3.9*  ALBUMIN 2.1* 1.9* 1.8*   Recent Labs  Lab 10/30/24 2022  LIPASE 31   No results for input(s): AMMONIA in the last 168 hours.  Coagulation Profile: Recent Labs  Lab 10/30/24 2003 10/31/24 0517  INR 1.1 1.0    Cardiac Enzymes: Recent Labs  Lab 10/31/24 0517  CKTOTAL 146    BNP (last 3 results) No results for input(s): PROBNP in the last 8760 hours.  Lipid Profile: No results for input(s): CHOL, HDL, LDLCALC, TRIG, CHOLHDL, LDLDIRECT in  the last 72 hours.  Thyroid Function Tests: No results for input(s): TSH, T4TOTAL, FREET4, T3FREE, THYROIDAB in the last 72 hours.  RADIOLOGY STUDIES/RESULTS: VAS US  LOWER EXTREMITY VENOUS (DVT) Result Date: 11/04/2024  Lower Venous DVT Study Patient Name:  CAMBREE HENDRIX  Date of Exam:   11/03/2024 Medical Rec #: [id number]       Accession #:    [id number] Date of Birth: Jun 06, 1956      Patient Gender: F Patient Age:   56 years Exam Location:  [facility] Procedure:      VAS US  LOWER EXTREMITY VENOUS (DVT) Referring Phys: DONALDA APPLEBAUM --------------------------------------------------------------------------------  Indications: Right lower extremity swelling.  Comparison Study: No prior study on file Performing Technologist: Alberta Lis RVS  Examination Guidelines: A complete evaluation includes B-mode imaging, spectral Doppler, color Doppler, and power Doppler as needed of all accessible portions of each vessel. Bilateral testing is considered an integral part of a complete examination. Limited examinations for reoccurring indications may be performed as noted. The reflux portion of the exam is performed with the patient in reverse Trendelenburg.  +---------+---------------+---------+-----------+----------+--------------+ RIGHT    CompressibilityPhasicitySpontaneityPropertiesThrombus Aging +---------+---------------+---------+-----------+----------+--------------+ CFV      Full           Yes      Yes                                 +---------+---------------+---------+-----------+----------+--------------+ SFJ      Full                                                        +---------+---------------+---------+-----------+----------+--------------+ FV Prox  Full           Yes      Yes                                 +---------+---------------+---------+-----------+----------+--------------+ FV Mid   Full                                                         +---------+---------------+---------+-----------+----------+--------------+ FV DistalFull           Yes      Yes                                 +---------+---------------+---------+-----------+----------+--------------+  PFV      Full           Yes      Yes                                 +---------+---------------+---------+-----------+----------+--------------+ POP      Full           Yes      Yes                                 +---------+---------------+---------+-----------+----------+--------------+ PTV      Full                                                        +---------+---------------+---------+-----------+----------+--------------+ PERO     Full                                                        +---------+---------------+---------+-----------+----------+--------------+ Gastroc  Full                                                        +---------+---------------+---------+-----------+----------+--------------+   +----+---------------+---------+-----------+----------+--------------+ LEFTCompressibilityPhasicitySpontaneityPropertiesThrombus Aging +----+---------------+---------+-----------+----------+--------------+ CFV Full           Yes      Yes                                 +----+---------------+---------+-----------+----------+--------------+ SFJ Full                                                        +----+---------------+---------+-----------+----------+--------------+    Summary: RIGHT: - There is no evidence of deep vein thrombosis in the lower extremity.  - No cystic structure found in the popliteal fossa.  LEFT: - No evidence of common femoral vein obstruction.   *See table(s) above for measurements and observations. Electronically signed by Debby Robertson on 11/04/2024 at 8:08:51 AM.    Final    MR ABDOMEN W WO CONTRAST Result Date: 11/03/2024 CLINICAL DATA:  New indeterminate bilateral renal masses CT.  EXAM: MRI ABDOMEN WITHOUT AND WITH CONTRAST TECHNIQUE: Multiplanar multisequence MR imaging of the abdomen was performed both before and after the administration of intravenous contrast. CONTRAST:  7.5mL GADAVIST GADOBUTROL 1 MMOL/ML IV SOLN COMPARISON:  CT 10/31/2024 and 05/29/2019. Renal ultrasound 02/01/2019. FINDINGS: Technical note: Despite efforts by the technologist and patient, mild to moderate motion artifact is present on today's exam and could not be eliminated. This reduces exam sensitivity and specificity. Motion is greatest on the postcontrast images, limiting the subtracted images. Lower chest: Scarring and bronchiectasis noted at both lung bases possible superimposed atelectasis at the right lung base.  There trace pleural effusions. Hepatobiliary: As seen on CT, the liver is enlarged with severe diffuse steatosis. No focal liver lesions are identified. No evidence of gallstones, gallbladder wall thickening or biliary dilatation. Pancreas: Unremarkable. No pancreatic ductal dilatation or surrounding inflammatory changes. Spleen: Normal in size without focal abnormality. Adrenals/Urinary Tract: Both adrenal glands appear normal. Compared with the 2020 CT, there is increasing renal cortical thinning and scarring bilaterally. No evidence of hydronephrosis. As seen recent CT there are exophytic renal lesions bilaterally, including a 2.2 x 2.0 cm lesion projecting from the upper pole right kidney (image 29/3) and a 1.4 x 1.2 cm lesion projecting from the interpolar region of the left kidney (image 23/3). No definite focal lesion is seen within the lower pole the right kidney. Both of these lesions demonstrate similar signal and enhancement to adjacent normal renal parenchyma on all pulse sequences. There does not appear to be any restricted diffusion associated with these lesions which may reflect cortical lobules/pseudo lesions. Bladder not imaged. Stomach/Bowel: The stomach appears unremarkable for its  degree of distension. No evidence of bowel wall thickening, distention or surrounding inflammatory change. Vascular/Lymphatic: There are no enlarged abdominal lymph nodes. No significant vascular findings. Other: Generalized subcutaneous edema. No ascites or focal intraperitoneal fluid collections identified. Musculoskeletal: No acute or significant osseous findings. IMPRESSION: 1. The examination is limited by motion artifact. 2. Both kidneys demonstrate increasing renal cortical thinning and scarring compared with the 2020 CT. There are exophytic renal lesions bilaterally which demonstrate similar signal and enhancement to adjacent normal renal parenchyma on all pulse sequences. These may reflect cortical lobules/pseudo lesions. No definite focal lesion is seen within the lower pole of the right kidney. Given the motion limitations of this examination, recommend follow-up CT or MRI in 6 months to assess stability. 3. Hepatomegaly with severe diffuse hepatic steatosis. 4. Scarring and bronchiectasis at both lung bases with possible superimposed atelectasis at the right lung base. Electronically Signed   By: Elsie Perone M.D.   On: 11/03/2024 10:18     LOS: 5 days   Lavada Stank, MD  [facility]  To contact the attending provider between 7A-7P or the covering provider during after hours 7P-7A, please log into the web site [email or web address] and access using universal  password for that web site. If you do not have the password, please call the hospital operator.  11/05/2024, 8:44 AM

## 2024-11-06 ENCOUNTER — Other Ambulatory Visit (HOSPITAL_COMMUNITY): Payer: Self-pay

## 2024-11-06 MED ORDER — ALBUTEROL SULFATE HFA 108 (90 BASE) MCG/ACT IN AERS
2.0000 | INHALATION_SPRAY | Freq: Four times a day (QID) | RESPIRATORY_TRACT | 0 refills | Status: AC | PRN
  Filled 2024-11-06: qty 6.7, 25d supply, fill #0

## 2024-11-06 MED ORDER — ACETAMINOPHEN 325 MG PO TABS
650.0000 mg | ORAL_TABLET | Freq: Two times a day (BID) | ORAL | 0 refills | Status: AC | PRN
  Filled 2024-11-06: qty 20, 5d supply, fill #0

## 2024-11-06 MED ORDER — LOPERAMIDE HCL 2 MG PO CAPS
4.0000 mg | ORAL_CAPSULE | ORAL | 0 refills | Status: AC | PRN
  Filled 2024-11-06: qty 10, 5d supply, fill #0

## 2024-11-06 MED ORDER — THIAMINE HCL 100 MG PO TABS
100.0000 mg | ORAL_TABLET | Freq: Every day | ORAL | 0 refills | Status: AC
Start: 2024-11-06 — End: ?
  Filled 2024-11-06: qty 30, 30d supply, fill #0

## 2024-11-06 MED ORDER — FOLIC ACID 1 MG PO TABS
1.0000 mg | ORAL_TABLET | Freq: Every day | ORAL | 0 refills | Status: AC
Start: 2024-11-06 — End: ?
  Filled 2024-11-06: qty 30, 30d supply, fill #0

## 2024-11-06 MED ORDER — METOPROLOL SUCCINATE ER 25 MG PO TB24
25.0000 mg | ORAL_TABLET | Freq: Every day | ORAL | 0 refills | Status: AC
  Filled 2024-11-06: qty 30, 30d supply, fill #0

## 2024-11-06 MED ORDER — ESOMEPRAZOLE MAGNESIUM 20 MG PO CPDR
40.0000 mg | DELAYED_RELEASE_CAPSULE | Freq: Every day | ORAL | 0 refills | Status: AC
  Filled 2024-11-06: qty 30, 15d supply, fill #0

## 2024-11-06 NOTE — TOC Transition Note (Signed)
 Transition of Care [facility]) - Discharge Note   Patient Details  Name: Ana Ryan MRN: [id number] Date of Birth: June 21, 1956  Transition of Care [facility]) CM/SW Contact:  Marval Gell, RN Phone Number: 11/06/2024, 7:58 AM   Clinical Narrative:     Beatris w patient's spouse yesterday afternoon to reinforce DC plan for today.  His plan is to talk to her again this morning to persuade her to go to etoh rehab in [place]. If she does not agree she has been set up with home health services. Through [facility] and they aware of DC plan and will check in 24-48 hours to see if they need to follow at home or if pt has gone to rehab. No DME needs, spouse will transport    Final next level of care: Home w Home Health Services Barriers to Discharge: No Barriers Identified   Patient Goals and CMS Choice Patient states their goals for this hospitalization and ongoing recovery are:: Recover from illness and return home, feel better for the [facility]          Discharge Placement                       Discharge Plan and Services Additional resources added to the After Visit Summary for   In-house Referral: Clinical Social Work Discharge Planning Services: CM Consult                        [facility] Agency: [facility] Date [facility] Agency Contacted: 11/06/24 Time HH Agency Contacted: [phone] Representative spoke with at [facility] Agency: Amy  Social Drivers of Health (SDOH) Interventions SDOH Screenings   Food Insecurity: No Food Insecurity (10/31/2024)  Housing: Low Risk  (10/31/2024)  Transportation Needs: No Transportation Needs (10/31/2024)  Utilities: Not At Risk (10/31/2024)  Alcohol Screen: Low Risk  (05/26/2022)  Depression (PHQ2-9): Low Risk  (05/26/2022)  Financial Resource Strain: Low Risk  (09/08/2023)   Received from [facility]  Physical Activity: Insufficiently Active (09/08/2023)   Received from [facility]  Social Connections: Moderately  Isolated (10/31/2024)  Tobacco Use: Medium Risk (10/31/2024)  Health Literacy: Adequate Health Literacy (09/08/2023)   Received from [facility]     Readmission Risk Interventions     No data to display

## 2024-11-06 NOTE — Discharge Instructions (Addendum)
 Follow with Primary MD Bertrum Charlie CROME, MD in 7 days, review your radiological test results with your PCP closely.  Follow-up with your nephrologist in 1 to 2 weeks as well and do the same.  Follow with Primary MD Bertrum Charlie CROME, MD in 7 days   Get CBC, CMP, Magnesium , 2 view Chest X ray -  checked next visit with your primary MD    Activity: As tolerated with Full fall precautions use walker/cane & assistance as needed  Disposition Home/Rehab  Diet: Heart Healthy    Special Instructions: If you have smoked or chewed Tobacco  in the last 2 yrs please stop smoking, stop any regular Alcohol  and or any Recreational drug use.  On your next visit with your primary care physician please Get Medicines reviewed and adjusted.  Please request your Prim.MD to go over all Hospital Tests and Procedure/Radiological results at the follow up, please get all Hospital records sent to your Prim MD by signing hospital release before you go home.  If you experience worsening of your admission symptoms, develop shortness of breath, life threatening emergency, suicidal or homicidal thoughts you must seek medical attention immediately by calling 911 or calling your MD immediately  if symptoms less severe.  You Must read complete instructions/literature along with all the possible adverse reactions/side effects for all the Medicines you take and that have been prescribed to you. Take any new Medicines after you have completely understood and accpet all the possible adverse reactions/side effects.   Do not drive when taking Pain medications.  Do not take more than prescribed Pain, Sleep and Anxiety Medications  Wear Seat belts while driving.   Please note  You were cared for by a hospitalist during your hospital stay. If you have any questions about your discharge medications or the care you received while you were in the hospital after you are discharged, you can call the unit and asked to speak with the  hospitalist on call if the hospitalist that took care of you is not available. Once you are discharged, your primary care physician will handle any further medical issues. Please note that NO REFILLS for any discharge medications will be authorized once you are discharged, as it is imperative that you return to your primary care physician (or establish a relationship with a primary care physician if you do not have one) for your aftercare needs so that they can reassess your need for medications and monitor your lab values.

## 2024-11-06 NOTE — Discharge Summary (Signed)
 Discharge summary note.  Ana Ryan FMW:[id number] DOB: [birth year] DOA: 10/30/2024  PCP: Bertrum Charlie CROME, MD  Admit date: 10/30/2024  Discharge date: 11/06/2024  Admitted From: Home   Disposition:  Home/Rehab   Recommendations for Outpatient Follow-up:   Follow up with PCP in 1-2 weeks  PCP Please obtain BMP/CBC, 2 view CXR in 1week,  (see Discharge instructions)   PCP Please follow up on the following pending results: Review CT scan findings in detail with the patient, needs close outpatient nephrology follow-up as well   Home Health: None   Equipment/Devices: None  Consultations: None  Discharge Condition: Stable    CODE STATUS: Full    D   Chief Complaint  Patient presents with   Shortness of Breath   Diarrhea     Brief history of present illness from the day of admission and additional interim summary    68 y.o.  female with history of asthma, CKD 4, HTN-who presented with almost 1 month history of gradually worsening generalized weakness, exertional dyspnea, coughing spells-she was found to have possible bronchopneumonia, worsening anemia and subsequently admitted to the hospitalist service.   Significant events: 11/12>> admit to [facility]   Significant studies: 11/12>> CT chest: Mild bronchiectasis in bilateral lungs-right> left, scattered small solid pulmonary nodules.   11/13>> CT abdomen/pelvis: Hepatic steatosis, multiple indeterminate renal masses. 11/14>> echo: EF 60-65%                                                                 Hospital Course   Generalized weakness Ongoing for at least a month if not longer-Complains of exertional dyspnea as well Suspect this is due to worsening anemia in the setting of CKD 4-with some contributions from PNA.  Although her TSH is slightly  elevated-suspect her level of weakness/dyspnea is disproportionate to her level of mildly elevated TSH. Echo is stable-no signs of CHF After supportive care went for pneumonia, 1 packed RBC transfusion, she is much improved and close to baseline, will require continued rehab postdischarge, for now she is going to alcohol rehab facility.  After that requested to follow with PCP within a week.   Bronchopneumonia/bronchiectasis Prior history of asthma. Reportedly-several years ago was on Advair-recently started on albuterol  inhaler by her PCP due to persistent coughing spells/exertional dyspnea.  Not formally diagnosed with asthma in the past. Was empirically treated for acute bronchitis/community-acquired pneumonia, has finished antibiotic course, no wheezing, no oxygen requirement, albuterol  inhaler provided, postdischarge follow-up with PCP, PCP to arrange outpatient pulmonary follow-up in 1 to 2 weeks. Will need outpatient follow-up for formal PFTs   Asymptomatic bacteriuria Urine cultures positive but-she has no dysuria/polyuria Doubt UTI-suspect this is more asymptomatic bacteriuria Was treated with Rocephin  empirically anyways.  Symptom-free.   Anemia No evidence of blood loss-suspect  this is related to CKD 4-although this is macrocytic (macrocytosis could be related to EtOH use-as B12/folic acid levels are stable). Discussed with pharmacy-now on weekly Aranesp/iron therapy For received 1 unit of packed RBC on 11/02/2024, posttransfusion CBC is stable Follow anemia panel and CBC by PCP.   CKD 4 Creatinine close to baseline Follows with Dr. [place] an outpatient.  Will follow-up with her in 1 to 2 weeks to follow-up on MRI results and possible repeat MRI in 5 to 6 months.   Hyponatremia Chronic and stable.   Hypertension Good control on low-dose beta-blocker   Transaminitis Mild Acute hepatitis serology negative CT imaging shows significant hepatic steatosis For now follow-but  suspect will need GI evaluation but this can be done as an outpatient.  PCP to monitor   Subclinical hypothyroidism TSH mildly elevated but free T4 normal Doubt her symptoms are from hypothyroidism-suspect anemia playing more of a big role Hold off on starting Synthroid Repeat TSH/free T4 as an outpatient in 4 to 6 weeks by PCP.   Right leg swelling Chronic, ultrasound negative   EtOH use Recently relapsed (has quit and relapsed multiple times in the past) Drinks vodka/orange juice-3 large drinks every day-1 bottle of vodka (750 mL) last for 3 days. No DTs, strictly counseled to abstain from alcohol, family taking her to alcohol rehab from the facility.   Multiple renal masses Incidental finding on CT abdomen MRI noted, largely inconclusive, outpatient follow-up with nephrology, PCP and repeat MRI in 8-6 months to be arranged by PCP    Discharge diagnosis     Principal Problem:   Generalized weakness Active Problems:   Acute renal failure superimposed on stage 4 chronic kidney disease (HCC)   Acute cystitis   Hyponatremia   Transaminitis   Macrocytic anemia   History of essential hypertension   History of alcohol abuse   General weakness    Discharge instructions    Discharge Instructions     Diet - low sodium heart healthy   Complete by: As directed    Discharge instructions   Complete by: As directed    Follow with Primary MD Bertrum Charlie CROME, MD in 7 days, review your radiological test results with your PCP closely.  Follow-up with your nephrologist in 1 to 2 weeks as well and do the same.  Get CBC, CMP, Magnesium , 2 view Chest X ray -  checked next visit with your primary MD    Activity: As tolerated with Full fall precautions use walker/cane & assistance as needed  Disposition Home/Rehab  Diet: Heart Healthy    Special Instructions: If you have smoked or chewed Tobacco  in the last 2 yrs please stop smoking, stop any regular Alcohol  and or any  Recreational drug use.  On your next visit with your primary care physician please Get Medicines reviewed and adjusted.  Please request your Prim.MD to go over all Hospital Tests and Procedure/Radiological results at the follow up, please get all Hospital records sent to your Prim MD by signing hospital release before you go home.  If you experience worsening of your admission symptoms, develop shortness of breath, life threatening emergency, suicidal or homicidal thoughts you must seek medical attention immediately by calling 911 or calling your MD immediately  if symptoms less severe.  You Must read complete instructions/literature along with all the possible adverse reactions/side effects for all the Medicines you take and that have been prescribed to you. Take any new Medicines after you have completely  understood and accpet all the possible adverse reactions/side effects.   Do not drive when taking Pain medications.  Do not take more than prescribed Pain, Sleep and Anxiety Medications  Wear Seat belts while driving.   Please note  You were cared for by a hospitalist during your hospital stay. If you have any questions about your discharge medications or the care you received while you were in the hospital after you are discharged, you can call the unit and asked to speak with the hospitalist on call if the hospitalist that took care of you is not available. Once you are discharged, your primary care physician will handle any further medical issues. Please note that NO REFILLS for any discharge medications will be authorized once you are discharged, as it is imperative that you return to your primary care physician (or establish a relationship with a primary care physician if you do not have one) for your aftercare needs so that they can reassess your need for medications and monitor your lab values.   Increase activity slowly   Complete by: As directed        Discharge Medications    Allergies as of 11/06/2024       Reactions   Dilaudid [hydromorphone Hcl] Other (See Comments)   hallucinations        Medication List     STOP taking these medications    Brimonidine Tartrate (PF) 0.025 % Soln       TAKE these medications    acetaminophen  325 MG tablet Commonly known as: TYLENOL  Take 2 tablets (650 mg total) by mouth every 12 (twelve) hours as needed for mild pain (pain score 1-3) or headache. What changed: when to take this   albuterol  108 (90 Base) MCG/ACT inhaler Commonly known as: VENTOLIN  HFA Inhale 2 puffs into the lungs every 6 (six) hours as needed.   esomeprazole  20 MG capsule Commonly known as: NEXIUM  Take 2 capsules (40 mg total) by mouth at bedtime. Take two capsules (40mg ) by mouth daily at bedtime.   folic acid 1 MG tablet Commonly known as: FOLVITE Take 1 tablet (1 mg total) by mouth daily.   loperamide 2 MG capsule Commonly known as: IMODIUM Take 2 capsules (4 mg total) by mouth as needed for diarrhea or loose stools.   metoprolol  succinate 25 MG 24 hr tablet Commonly known as: TOPROL -XL Take 1 tablet (25 mg total) by mouth daily. Take with or immediately following a meal. Start taking on: November 07, 2024 What changed:  medication strength how much to take how to take this when to take this   thiamine 100 MG tablet Commonly known as: Vitamin B-1 Take 1 tablet (100 mg total) by mouth daily.         Contact information for follow-up providers     Norine Manuelita LABOR, MD. Schedule an appointment as soon as possible for a visit in 1 week(s).   Specialty: Nephrology Why: For CKD 4, follow-up on MRI kidney results.  You need a repeat MRI of your kidneys in 6 months. Contact information: [address] [place] [address] [phone]         Bertrum Charlie CROME, MD. Schedule an appointment as soon as possible for a visit in 1 week(s).   Specialty: Family Medicine Contact information: [address] [place]  [address] [place] [address] [phone]              Contact information for after-discharge care     Home Medical  Care     [facility]) .   Service: Home Health Services Contact information: [address] [place]  [phone]                     Major procedures and Radiology Reports - PLEASE review detailed and final reports thoroughly  -     VAS US  LOWER EXTREMITY VENOUS (DVT) Result Date: 11/04/2024  Lower Venous DVT Study Patient Name:  HAZELENE DOTEN  Date of Exam:   11/03/2024 Medical Rec #: [id number]       Accession #:    [id number] Date of Birth: 05-20-1956      Patient Gender: F Patient Age:   14 years Exam Location:  [facility] Procedure:      VAS US  LOWER EXTREMITY VENOUS (DVT) Referring Phys: DONALDA APPLEBAUM --------------------------------------------------------------------------------  Indications: Right lower extremity swelling.  Comparison Study: No prior study on file Performing Technologist: Alberta Lis RVS  Examination Guidelines: A complete evaluation includes B-mode imaging, spectral Doppler, color Doppler, and power Doppler as needed of all accessible portions of each vessel. Bilateral testing is considered an integral part of a complete examination. Limited examinations for reoccurring indications may be performed as noted. The reflux portion of the exam is performed with the patient in reverse Trendelenburg.  +---------+---------------+---------+-----------+----------+--------------+ RIGHT    CompressibilityPhasicitySpontaneityPropertiesThrombus Aging +---------+---------------+---------+-----------+----------+--------------+ CFV      Full           Yes      Yes                                 +---------+---------------+---------+-----------+----------+--------------+ SFJ      Full                                                         +---------+---------------+---------+-----------+----------+--------------+ FV Prox  Full           Yes      Yes                                 +---------+---------------+---------+-----------+----------+--------------+ FV Mid   Full                                                        +---------+---------------+---------+-----------+----------+--------------+ FV DistalFull           Yes      Yes                                 +---------+---------------+---------+-----------+----------+--------------+ PFV      Full           Yes      Yes                                 +---------+---------------+---------+-----------+----------+--------------+ POP      Full  Yes      Yes                                 +---------+---------------+---------+-----------+----------+--------------+ PTV      Full                                                        +---------+---------------+---------+-----------+----------+--------------+ PERO     Full                                                        +---------+---------------+---------+-----------+----------+--------------+ Gastroc  Full                                                        +---------+---------------+---------+-----------+----------+--------------+   +----+---------------+---------+-----------+----------+--------------+ LEFTCompressibilityPhasicitySpontaneityPropertiesThrombus Aging +----+---------------+---------+-----------+----------+--------------+ CFV Full           Yes      Yes                                 +----+---------------+---------+-----------+----------+--------------+ SFJ Full                                                        +----+---------------+---------+-----------+----------+--------------+    Summary: RIGHT: - There is no evidence of deep vein thrombosis in the lower extremity.  - No cystic structure found in the popliteal fossa.  LEFT: - No  evidence of common femoral vein obstruction.   *See table(s) above for measurements and observations. Electronically signed by Debby Robertson on 11/04/2024 at 8:08:51 AM.    Final    MR ABDOMEN W WO CONTRAST Result Date: 11/03/2024 CLINICAL DATA:  New indeterminate bilateral renal masses CT. EXAM: MRI ABDOMEN WITHOUT AND WITH CONTRAST TECHNIQUE: Multiplanar multisequence MR imaging of the abdomen was performed both before and after the administration of intravenous contrast. CONTRAST:  7.5mL GADAVIST GADOBUTROL 1 MMOL/ML IV SOLN COMPARISON:  CT 10/31/2024 and 05/29/2019. Renal ultrasound 02/01/2019. FINDINGS: Technical note: Despite efforts by the technologist and patient, mild to moderate motion artifact is present on today's exam and could not be eliminated. This reduces exam sensitivity and specificity. Motion is greatest on the postcontrast images, limiting the subtracted images. Lower chest: Scarring and bronchiectasis noted at both lung bases possible superimposed atelectasis at the right lung base. There trace pleural effusions. Hepatobiliary: As seen on CT, the liver is enlarged with severe diffuse steatosis. No focal liver lesions are identified. No evidence of gallstones, gallbladder wall thickening or biliary dilatation. Pancreas: Unremarkable. No pancreatic ductal dilatation or surrounding inflammatory changes. Spleen: Normal in size without focal abnormality. Adrenals/Urinary Tract: Both adrenal glands appear normal. Compared with the 2020 CT, there is increasing renal cortical thinning and scarring bilaterally. No evidence of  hydronephrosis. As seen recent CT there are exophytic renal lesions bilaterally, including a 2.2 x 2.0 cm lesion projecting from the upper pole right kidney (image 29/3) and a 1.4 x 1.2 cm lesion projecting from the interpolar region of the left kidney (image 23/3). No definite focal lesion is seen within the lower pole the right kidney. Both of these lesions demonstrate  similar signal and enhancement to adjacent normal renal parenchyma on all pulse sequences. There does not appear to be any restricted diffusion associated with these lesions which may reflect cortical lobules/pseudo lesions. Bladder not imaged. Stomach/Bowel: The stomach appears unremarkable for its degree of distension. No evidence of bowel wall thickening, distention or surrounding inflammatory change. Vascular/Lymphatic: There are no enlarged abdominal lymph nodes. No significant vascular findings. Other: Generalized subcutaneous edema. No ascites or focal intraperitoneal fluid collections identified. Musculoskeletal: No acute or significant osseous findings. IMPRESSION: 1. The examination is limited by motion artifact. 2. Both kidneys demonstrate increasing renal cortical thinning and scarring compared with the 2020 CT. There are exophytic renal lesions bilaterally which demonstrate similar signal and enhancement to adjacent normal renal parenchyma on all pulse sequences. These may reflect cortical lobules/pseudo lesions. No definite focal lesion is seen within the lower pole of the right kidney. Given the motion limitations of this examination, recommend follow-up CT or MRI in 6 months to assess stability. 3. Hepatomegaly with severe diffuse hepatic steatosis. 4. Scarring and bronchiectasis at both lung bases with possible superimposed atelectasis at the right lung base. Electronically Signed   By: Elsie Perone M.D.   On: 11/03/2024 10:18   ECHOCARDIOGRAM COMPLETE Result Date: 11/01/2024    ECHOCARDIOGRAM REPORT   Patient Name:   NAZIYAH TIESZEN Date of Exam: 11/01/2024 Medical Rec #:  [id number]      Height:       62.0 in Accession #:    [id number]     Weight:       155.9 lb Date of Birth:  Dec 23, 1955     BSA:          1.719 m Patient Age:    67 years       BP:           96/65 mmHg Patient Gender: F              HR:           98 bpm. Exam Location:  Inpatient Procedure: 2D Echo, Cardiac Doppler and Color  Doppler (Both Spectral and Color            Flow Doppler were utilized during procedure). Indications:    Dyspnea  History:        Patient has no prior history of Echocardiogram examinations.                 Risk Factors:Hypertension and Dyslipidemia.  Sonographer:    Meagan Baucom RDCS, FE, PE Referring Phys: DONALDA HERO [facility] IMPRESSIONS  1. Left ventricular ejection fraction, by estimation, is 60 to 65%. The left ventricle has normal function. The left ventricle has no regional wall motion abnormalities. Indeterminate diastolic filling due to E-A fusion.  2. Right ventricular systolic function is normal. The right ventricular size is normal.  3. The mitral valve is normal in structure. No evidence of mitral valve regurgitation. No evidence of mitral stenosis.  4. The aortic valve is normal in structure. Aortic valve regurgitation is not visualized. No aortic stenosis is present.  5. The inferior vena cava is normal in  size with greater than 50% respiratory variability, suggesting right atrial pressure of 3 mmHg. Comparison(s): No prior Echocardiogram. FINDINGS  Left Ventricle: Left ventricular ejection fraction, by estimation, is 60 to 65%. The left ventricle has normal function. The left ventricle has no regional wall motion abnormalities. The left ventricular internal cavity size was normal in size. There is  no left ventricular hypertrophy. Indeterminate diastolic filling due to E-A fusion. Right Ventricle: The right ventricular size is normal. No increase in right ventricular wall thickness. Right ventricular systolic function is normal. Left Atrium: Left atrial size was normal in size. Right Atrium: Right atrial size was normal in size. Pericardium: There is no evidence of pericardial effusion. Presence of epicardial fat layer. Mitral Valve: The mitral valve is normal in structure. No evidence of mitral valve regurgitation. No evidence of mitral valve stenosis. Tricuspid Valve: The tricuspid valve is normal  in structure. Tricuspid valve regurgitation is not demonstrated. No evidence of tricuspid stenosis. Aortic Valve: The aortic valve is normal in structure. Aortic valve regurgitation is not visualized. No aortic stenosis is present. Pulmonic Valve: The pulmonic valve was normal in structure. Pulmonic valve regurgitation is not visualized. No evidence of pulmonic stenosis. Aorta: The aortic root is normal in size and structure. Venous: The inferior vena cava is normal in size with greater than 50% respiratory variability, suggesting right atrial pressure of 3 mmHg. IAS/Shunts: No atrial level shunt detected by color flow Doppler.  LEFT VENTRICLE PLAX 2D LVIDd:         3.70 cm   Diastology LVIDs:         2.80 cm   LV e' medial:    7.51 cm/s LV PW:         1.00 cm   LV E/e' medial:  6.8 LV IVS:        1.10 cm   LV e' lateral:   9.14 cm/s LVOT diam:     2.20 cm   LV E/e' lateral: 5.6 LV SV:         78 LV SV Index:   46 LVOT Area:     3.80 cm  RIGHT VENTRICLE RV Basal diam:  2.90 cm RV Mid diam:    2.30 cm RV S prime:     26.60 cm/s TAPSE (M-mode): 1.3 cm LEFT ATRIUM             Index        RIGHT ATRIUM           Index LA Vol (A2C):   35.9 ml 20.88 ml/m  RA Area:     10.20 cm LA Vol (A4C):   41.2 ml 23.96 ml/m  RA Volume:   18.90 ml  10.99 ml/m LA Biplane Vol: 38.6 ml 22.45 ml/m  AORTIC VALVE LVOT Vmax:   150.00 cm/s LVOT Vmean:  88.800 cm/s LVOT VTI:    0.206 m  AORTA Ao Root diam: 3.60 cm Ao Asc diam:  3.40 cm MITRAL VALVE               TRICUSPID VALVE MV Area (PHT): 4.57 cm    TR Peak grad:   4.3 mmHg MV Decel Time: 166 msec    TR Vmax:        104.00 cm/s MV E velocity: 50.80 cm/s MV A velocity: 80.70 cm/s  SHUNTS MV E/A ratio:  0.63        Systemic VTI:  0.21 m  Systemic Diam: 2.20 cm Emeline Calender Electronically signed by Emeline Calender Signature Date/Time: 11/01/2024/6:02:12 PM    Final    CT ABDOMEN PELVIS WO CONTRAST Result Date: 10/31/2024 EXAM: CT ABDOMEN AND PELVIS WITHOUT  CONTRAST 10/31/2024 04:00:12 AM TECHNIQUE: CT of the abdomen and pelvis was performed without the administration of intravenous contrast. Multiplanar reformatted images are provided for review. Automated exposure control, iterative reconstruction, and/or weight-based adjustment of the mA/kV was utilized to reduce the radiation dose to as low as reasonably achievable. COMPARISON: CT of the abdomen and pelvis dated 08/25/2019. CLINICAL HISTORY: Kidney failure, acute. FINDINGS: LOWER CHEST: There are new streaky opacities present within the periphery of the lower lobes bilaterally, more pronounced on the right. There is also mild bronchiectasis present, also worse in the right lung base. LIVER: There is advanced fatty infiltration of the liver which measures approximately 25 cm in length in the midclavicular line. GALLBLADDER AND BILE DUCTS: Gallbladder is unremarkable. No biliary ductal dilatation. SPLEEN: No acute abnormality. PANCREAS: No acute abnormality. ADRENAL GLANDS: No acute abnormality. KIDNEYS, URETERS AND BLADDER: There is a round exophytic nodule arising posteriorly from the superior pole of the right kidney, measuring approximately 20 mm in diameter. There also appears to be a nodule arising from the lower pole of the right kidney, image 46 of series 3, measuring approximately 2.3 x 1.4 cm. The left kidney is atrophic. There is a 12 mm exophytic nodule arising posterolaterally from the superior pole of the left kidney. Correlation with a renal sonogram is suggested. No stones in the kidneys or ureters. No hydronephrosis. No perinephric or periureteral stranding. Urinary bladder is unremarkable. GI AND BOWEL: Stomach demonstrates no acute abnormality. There is no bowel obstruction. The patient also appears to be status post appendectomy. PERITONEUM AND RETROPERITONEUM: No ascites. No free air. VASCULATURE: Aorta is normal in caliber. LYMPH NODES: No lymphadenopathy. REPRODUCTIVE ORGANS: The patient is  status post hysterectomy and bilateral salpingo-oophorectomy. BONES AND SOFT TISSUES: The patient is status post bilateral total hip arthroplasties. No acute osseous abnormality. No focal soft tissue abnormality. IMPRESSION: 1. New streaky opacities in the periphery of the lower lobes bilaterally, more pronounced on the right, and mild bronchiectasis, worse in the right lung base 2. Advanced hepatic steatosis 3. Multiple indeterminate renal masses: right superior pole exophytic 20 mm, right lower pole 2.3 x 1.4 cm, and left superior pole exophytic 12 mm in an atrophic kidney. Recommend prompt renal protocol MRI or CT without and with contrast for characterization. If any lesion is confirmed as a simple cyst (Bosniak I/II), no further follow-up is needed; otherwise manage per Bosniak/renal mass guidelines Electronically signed by: Evalene Coho MD 10/31/2024 04:15 AM EST RP Workstation: [id number]   CT CHEST WO CONTRAST Result Date: 10/30/2024 CLINICAL DATA:  Shortness of breath productive cough EXAM: CT CHEST WITHOUT CONTRAST TECHNIQUE: Multidetector CT imaging of the chest was performed following the standard protocol without IV contrast. RADIATION DOSE REDUCTION: This exam was performed according to the departmental dose-optimization program which includes automated exposure control, adjustment of the mA and/or kV according to patient size and/or use of iterative reconstruction technique. COMPARISON:  Chest x-ray 10/30/2024, CT 05/29/2019 FINDINGS: Cardiovascular: Limited without intravenous contrast. Normal cardiac size. No pericardial effusion. Nonaneurysmal aorta Mediastinum/Nodes: Patent trachea. No thyroid mass. No suspicious lymph nodes. Esophagus within normal limits. Lungs/Pleura: Mild bronchiectasis within the bilateral lungs. Right greater than left subpleural reticulations and bandlike densities with areas of mild distortion, favored to represent post infectious or post inflammatory scarring. No  acute confluent airspace disease, pleural effusion or pneumothorax. Scattered small solid pulmonary nodules measuring less than 5 mm, for example punctate 2 mm superior left lower lobe pulmonary nodule on series 3, image 62. 3 mm right upper lobe pulmonary nodule on series 3, image 61. Upper Abdomen: Elevation of the right diaphragm. Marked low-density liver consistent with steatosis. The liver is enlarged, measuring at least 19 cm craniocaudal with the inferior extent non included. The kidneys are atrophic. Complex lesions within the bilateral kidneys, on the right measuring 16 mm and on the left measuring 12 mm. Musculoskeletal: No acute osseous abnormality. IMPRESSION: 1. Mild bronchiectasis within the bilateral lungs with right greater than left subpleural reticulations and bandlike densities and areas of mild distortion, favored to represent post infectious or post inflammatory scarring. No acute confluent airspace disease. 2. Scattered small solid pulmonary nodules measuring less than 5 mm. No follow-up needed if patient is low-risk (and has no known or suspected primary neoplasm). Non-contrast chest CT can be considered in 12 months if patient is high-risk. This recommendation follows the consensus statement: Guidelines for Management of Incidental Pulmonary Nodules Detected on CT Images: From the Fleischner Society 2017; Radiology 2017; 284:228-243. 3. Hepatomegaly with marked hepatic steatosis. 4. Atrophic kidneys with suspected complex lesions within the bilateral kidneys, incompletely characterized on this noncontrast study and also incompletely visualized. Consider initial evaluation with nonemergent renal ultrasound though renal CT or MRI might ultimately be required. Electronically Signed   By: Luke Bun M.D.   On: 10/30/2024 21:44   DG Chest Portable 1 View Result Date: 10/30/2024 EXAM: 1 VIEW(S) XRAY OF THE CHEST 10/30/2024 08:30:00 PM COMPARISON: Comparison with 02/01/2019. CLINICAL HISTORY:  shob shob FINDINGS: LUNGS AND PLEURA: Streak opacities in the right mid and lower lung and left lower lung favor atelectasis or scarring. No pleural effusion. No pneumothorax. HEART AND MEDIASTINUM: Stable cardiomediastinal silhouette. BONES AND SOFT TISSUES: No acute osseous abnormality. IMPRESSION: 1. Streaky opacities in the right mid/lower and left lower lung, favoring atelectasis or scarring. Infiltrates not excluded. Electronically signed by: Norman Gatlin MD 10/30/2024 08:32 PM EST RP Workstation: [id number]   Today   Subjective    Hadassah Donovan today has no headache,no chest abdominal pain,no new weakness tingling or numbness, feels much better wants to go home today.    Objective   Blood pressure 124/76, pulse 81, temperature 98 F (36.7 C), temperature source Oral, resp. rate 18, height 5' 2 (1.575 m), weight 78.2 kg, SpO2 94%.  No intake or output data in the 24 hours ending 11/06/24 0709  Exam  Awake Alert, No new F.N deficits,    [place].AT,PERRAL Supple Neck,   Symmetrical Chest wall movement, Good air movement bilaterally, CTAB RRR,No Gallops,   +ve B.Sounds, Abd Soft, Non tender,  No Cyanosis, Clubbing or edema    Data Review   Recent Labs  Lab 10/31/24 0517 11/01/24 0258 11/02/24 0324 11/03/24 0508 11/04/24 0338  WBC 4.9 4.8 5.3 5.8 5.2  HGB 7.6* 7.0* 6.9* 8.3* 8.3*  HCT 24.5* 21.9* 21.4* 25.8* 25.7*  PLT 204 227 231 212 209  MCV 104.3* 101.9* 101.4* 97.4 98.5  MCH 32.3 32.6 32.7 31.3 31.8  MCHC 31.0 32.0 32.2 32.2 32.3  RDW 15.2 15.1 15.3 18.0* 17.9*  LYMPHSABS 0.7  --   --   --  0.9  MONOABS 0.7  --   --   --  0.9  EOSABS 0.1  --   --   --  0.3  BASOSABS 0.0  --   --   --  0.1    Recent Labs  Lab 10/30/24 2003 10/30/24 2022 10/30/24 2022 10/30/24 2246 10/31/24 0517 11/01/24 0258 11/02/24 0324 11/03/24 0508 11/04/24 0338  NA  --  128*   < >  --  127* 129* 131* 132* 134*  K  --  4.4   < >  --  4.2 3.8 3.3* 4.1 3.7  CL  --  95*   < >  --  97*  100 101 102 104  CO2  --  19*   < >  --  16* 17* 18* 19* 19*  ANIONGAP  --  14   < >  --  14 12 12 11 11   GLUCOSE  --  117*   < >  --  95 84 90 94 95  BUN  --  22   < >  --  21 20 19 15 15   CREATININE  --  2.96*   < >  --  2.83* 2.92* 2.82* 2.63* 2.56*  AST  --  76*  --   --  61* 64*  --   --   --   ALT  --  70*  --   --  60* 53*  --   --   --   ALKPHOS  --  171*  --   --  140* 139*  --   --   --   BILITOT  --  2.2*  --   --  1.5* 1.1  --   --   --   ALBUMIN  --  2.1*  --   --  1.9* 1.8*  --   --   --   DDIMER  --  0.65*  --   --   --   --   --   --   --   INR 1.1  --   --   --  1.0  --   --   --   --   TSH  --   --   --   --  12.223*  --   --   --   --   BNP  --  149.5*  --   --  149.8*  --   --   --   --   MG  --   --   --  1.9 1.7  --   --   --  1.8  CALCIUM   --  8.1*   < >  --  8.0* 7.9* 7.8* 8.0* 8.1*   < > = values in this interval not displayed.    Total Time in preparing paper work, data evaluation and todays exam - 35 minutes  Signature  -    Lavada Stank M.D on 11/06/2024 at 7:09 AM   -  To page go to [email or web address]

## 2024-11-06 NOTE — Progress Notes (Signed)
Discharge teaching complete. Meds, diet, activity, follow up appointments reviewed and all questions answered.

## 2024-11-06 NOTE — Plan of Care (Signed)
  Problem: Activity: Goal: Risk for activity intolerance will decrease Outcome: Progressing   Problem: Safety: Goal: Ability to remain free from injury will improve Outcome: Progressing   Problem: Activity: Goal: Risk for activity intolerance will decrease Outcome: Progressing

## 2024-11-17 ENCOUNTER — Other Ambulatory Visit (HOSPITAL_COMMUNITY): Payer: Self-pay
